# Patient Record
Sex: Female | Born: 1950 | Race: White | Hispanic: No | Smoking: Former smoker
Health system: Southern US, Community
[De-identification: ages and names within clinical notes are randomized; demographics above are authoritative.]

## PROBLEM LIST (undated history)

## (undated) DIAGNOSIS — K259 Gastric ulcer, unspecified as acute or chronic, without hemorrhage or perforation: Secondary | ICD-10-CM

## (undated) DIAGNOSIS — D75839 Thrombocytosis, unspecified: Secondary | ICD-10-CM

## (undated) DIAGNOSIS — I8500 Esophageal varices without bleeding: Secondary | ICD-10-CM

## (undated) DIAGNOSIS — K648 Other hemorrhoids: Secondary | ICD-10-CM

## (undated) DIAGNOSIS — G4700 Insomnia, unspecified: Secondary | ICD-10-CM

## (undated) DIAGNOSIS — K7460 Unspecified cirrhosis of liver: Secondary | ICD-10-CM

## (undated) DIAGNOSIS — K641 Second degree hemorrhoids: Secondary | ICD-10-CM

## (undated) DIAGNOSIS — F32A Depression, unspecified: Secondary | ICD-10-CM

## (undated) DIAGNOSIS — R011 Cardiac murmur, unspecified: Secondary | ICD-10-CM

## (undated) DIAGNOSIS — I728 Aneurysm of other specified arteries: Secondary | ICD-10-CM

## (undated) DIAGNOSIS — G43909 Migraine, unspecified, not intractable, without status migrainosus: Secondary | ICD-10-CM

## (undated) DIAGNOSIS — K2970 Gastritis, unspecified, without bleeding: Secondary | ICD-10-CM

## (undated) DIAGNOSIS — D7581 Myelofibrosis: Secondary | ICD-10-CM

## (undated) DIAGNOSIS — C801 Malignant (primary) neoplasm, unspecified: Secondary | ICD-10-CM

## (undated) DIAGNOSIS — I81 Portal vein thrombosis: Secondary | ICD-10-CM

## (undated) DIAGNOSIS — F329 Major depressive disorder, single episode, unspecified: Secondary | ICD-10-CM

## (undated) DIAGNOSIS — D62 Acute posthemorrhagic anemia: Secondary | ICD-10-CM

## (undated) DIAGNOSIS — K766 Portal hypertension: Secondary | ICD-10-CM

## (undated) DIAGNOSIS — K3189 Other diseases of stomach and duodenum: Secondary | ICD-10-CM

## (undated) DIAGNOSIS — K635 Polyp of colon: Secondary | ICD-10-CM

## (undated) HISTORY — DX: Unspecified cirrhosis of liver: K74.60

## (undated) HISTORY — DX: Gastric ulcer, unspecified as acute or chronic, without hemorrhage or perforation: K25.9

## (undated) HISTORY — DX: Major depressive disorder, single episode, unspecified: F32.9

## (undated) HISTORY — DX: Acute posthemorrhagic anemia: D62

## (undated) HISTORY — DX: Migraine, unspecified, not intractable, without status migrainosus: G43.909

## (undated) HISTORY — DX: Second degree hemorrhoids: K64.1

## (undated) HISTORY — DX: Gastritis, unspecified, without bleeding: K29.70

## (undated) HISTORY — DX: Insomnia, unspecified: G47.00

## (undated) HISTORY — DX: Aneurysm of other specified arteries: I72.8

## (undated) HISTORY — DX: Portal hypertension: K76.6

## (undated) HISTORY — DX: Other hemorrhoids: K64.8

## (undated) HISTORY — DX: Polyp of colon: K63.5

## (undated) HISTORY — DX: Esophageal varices without bleeding: I85.00

## (undated) HISTORY — PX: TONSILLECTOMY: SUR1361

## (undated) HISTORY — DX: Portal vein thrombosis: I81

## (undated) HISTORY — DX: Cardiac murmur, unspecified: R01.1

## (undated) HISTORY — PX: LIVER BIOPSY: SHX301

## (undated) HISTORY — DX: Other diseases of stomach and duodenum: K31.89

## (undated) HISTORY — DX: Depression, unspecified: F32.A

---

## 1998-03-21 ENCOUNTER — Other Ambulatory Visit: Admission: RE | Admit: 1998-03-21 | Discharge: 1998-03-21 | Payer: Self-pay | Admitting: *Deleted

## 1998-08-02 ENCOUNTER — Emergency Department (HOSPITAL_COMMUNITY): Admission: EM | Admit: 1998-08-02 | Discharge: 1998-08-02 | Payer: Self-pay | Admitting: Emergency Medicine

## 1998-11-29 ENCOUNTER — Encounter: Payer: Self-pay | Admitting: *Deleted

## 1998-11-29 ENCOUNTER — Emergency Department (HOSPITAL_COMMUNITY): Admission: EM | Admit: 1998-11-29 | Discharge: 1998-11-29 | Payer: Self-pay | Admitting: Emergency Medicine

## 2002-07-20 ENCOUNTER — Encounter: Payer: Self-pay | Admitting: Internal Medicine

## 2002-07-20 ENCOUNTER — Ambulatory Visit (HOSPITAL_COMMUNITY): Admission: RE | Admit: 2002-07-20 | Discharge: 2002-07-20 | Payer: Self-pay | Admitting: Internal Medicine

## 2002-07-25 ENCOUNTER — Encounter: Payer: Self-pay | Admitting: Internal Medicine

## 2002-07-25 ENCOUNTER — Ambulatory Visit (HOSPITAL_COMMUNITY): Admission: RE | Admit: 2002-07-25 | Discharge: 2002-07-25 | Payer: Self-pay | Admitting: Internal Medicine

## 2002-08-07 ENCOUNTER — Encounter: Payer: Self-pay | Admitting: Internal Medicine

## 2002-08-07 ENCOUNTER — Ambulatory Visit (HOSPITAL_COMMUNITY): Admission: RE | Admit: 2002-08-07 | Discharge: 2002-08-07 | Payer: Self-pay | Admitting: Internal Medicine

## 2008-08-27 ENCOUNTER — Other Ambulatory Visit: Admission: RE | Admit: 2008-08-27 | Discharge: 2008-08-27 | Payer: Self-pay | Admitting: Family Medicine

## 2009-12-27 ENCOUNTER — Encounter: Admission: RE | Admit: 2009-12-27 | Discharge: 2009-12-27 | Payer: Self-pay | Admitting: Family Medicine

## 2010-01-02 ENCOUNTER — Ambulatory Visit (HOSPITAL_COMMUNITY): Admission: RE | Admit: 2010-01-02 | Discharge: 2010-01-02 | Payer: Self-pay | Admitting: Gastroenterology

## 2010-01-04 ENCOUNTER — Encounter: Admission: RE | Admit: 2010-01-04 | Discharge: 2010-01-04 | Payer: Self-pay | Admitting: Gastroenterology

## 2010-01-16 ENCOUNTER — Ambulatory Visit (HOSPITAL_COMMUNITY): Admission: RE | Admit: 2010-01-16 | Discharge: 2010-01-16 | Payer: Self-pay | Admitting: Oncology

## 2010-01-31 ENCOUNTER — Ambulatory Visit (HOSPITAL_COMMUNITY): Admission: RE | Admit: 2010-01-31 | Discharge: 2010-01-31 | Payer: Self-pay | Admitting: Gastroenterology

## 2010-03-04 ENCOUNTER — Ambulatory Visit: Payer: Self-pay | Admitting: Oncology

## 2010-03-13 LAB — CBC WITH DIFFERENTIAL/PLATELET
BASO%: 0.2 % (ref 0.0–2.0)
Basophils Absolute: 0 10*3/uL (ref 0.0–0.1)
MCH: 28.9 pg (ref 25.1–34.0)
MCHC: 33.8 g/dL (ref 31.5–36.0)
MONO#: 0.4 10*3/uL (ref 0.1–0.9)
RBC: 5.23 10*6/uL (ref 3.70–5.45)
WBC: 6.4 10*3/uL (ref 3.9–10.3)
lymph#: 1.3 10*3/uL (ref 0.9–3.3)
nRBC: 0 % (ref 0–0)

## 2010-03-17 LAB — HYPERCOAGULABLE PANEL, COMPREHENSIVE
Anticardiolipin IgA: 1 APL U/mL (ref <22)
Anticardiolipin IgG: 4 GPL U/mL (ref <23)
Anticardiolipin IgM: 4 MPL U/mL (ref <11)
Beta-2-Glycoprotein I IgA: 9 A Units (ref <20)
Beta-2-Glycoprotein I IgM: 10 M Units (ref <20)
DRVVT: 44.9 secs — ABNORMAL HIGH (ref 36.2–44.3)
Lupus Anticoagulant: NOT DETECTED
Protein S Activity: 62 % — ABNORMAL LOW (ref 69–129)

## 2010-03-17 LAB — COMPREHENSIVE METABOLIC PANEL
ALT: 48 U/L — ABNORMAL HIGH (ref 0–35)
AST: 57 U/L — ABNORMAL HIGH (ref 0–37)
Alkaline Phosphatase: 224 U/L — ABNORMAL HIGH (ref 39–117)
BUN: 27 mg/dL — ABNORMAL HIGH (ref 6–23)
CO2: 20 mEq/L (ref 19–32)
Glucose, Bld: 80 mg/dL (ref 70–99)
Potassium: 4.3 mEq/L (ref 3.5–5.3)
Sodium: 140 mEq/L (ref 135–145)

## 2010-03-17 LAB — PNH PROFILE (-HIGH SENSITIVITY)

## 2010-04-03 ENCOUNTER — Ambulatory Visit: Payer: Self-pay | Admitting: Oncology

## 2010-04-28 ENCOUNTER — Ambulatory Visit: Payer: Self-pay | Admitting: Gastroenterology

## 2010-05-07 ENCOUNTER — Ambulatory Visit: Payer: Self-pay | Admitting: Oncology

## 2011-01-06 ENCOUNTER — Other Ambulatory Visit: Payer: Self-pay | Admitting: Gastroenterology

## 2011-01-06 DIAGNOSIS — R110 Nausea: Secondary | ICD-10-CM

## 2011-01-09 ENCOUNTER — Other Ambulatory Visit: Payer: Self-pay

## 2011-01-21 LAB — BODY FLUID CULTURE: Culture: NO GROWTH

## 2011-01-21 LAB — CBC
HCT: 41.4 % (ref 36.0–46.0)
MCV: 89.6 fL (ref 78.0–100.0)
Platelets: 407 10*3/uL — ABNORMAL HIGH (ref 150–400)
RBC: 4.62 MIL/uL (ref 3.87–5.11)
RDW: 16 % — ABNORMAL HIGH (ref 11.5–15.5)

## 2011-01-21 LAB — APTT: aPTT: 40 seconds — ABNORMAL HIGH (ref 24–37)

## 2011-01-26 ENCOUNTER — Ambulatory Visit (INDEPENDENT_AMBULATORY_CARE_PROVIDER_SITE_OTHER): Payer: BC Managed Care – PPO | Admitting: Gastroenterology

## 2011-01-26 DIAGNOSIS — R945 Abnormal results of liver function studies: Secondary | ICD-10-CM

## 2011-01-26 DIAGNOSIS — K766 Portal hypertension: Secondary | ICD-10-CM

## 2011-01-26 LAB — ALBUMIN, FLUID (OTHER)
Albumin, Fluid: 1.3 g/dL
Albumin, Fluid: 1.2 g/dL

## 2011-01-26 LAB — BODY FLUID CELL COUNT WITH DIFFERENTIAL
Monocyte-Macrophage-Serous Fluid: 33 % — ABNORMAL LOW (ref 50–90)
Neutrophil Count, Fluid: 39 % — ABNORMAL HIGH (ref 0–25)

## 2011-01-26 LAB — PROTEIN, BODY FLUID: Total protein, fluid: 1.5 g/dL

## 2011-01-26 LAB — GLUCOSE, SEROUS FLUID

## 2011-07-27 ENCOUNTER — Ambulatory Visit: Payer: Self-pay | Admitting: Gastroenterology

## 2012-05-09 ENCOUNTER — Other Ambulatory Visit: Payer: Self-pay | Admitting: Family Medicine

## 2012-05-09 DIAGNOSIS — Z1231 Encounter for screening mammogram for malignant neoplasm of breast: Secondary | ICD-10-CM

## 2012-05-19 ENCOUNTER — Telehealth: Payer: Self-pay | Admitting: Oncology

## 2012-05-19 NOTE — Telephone Encounter (Signed)
lmonvm for pt re calling me for appt w/FS. °

## 2012-05-20 ENCOUNTER — Telehealth: Payer: Self-pay | Admitting: Oncology

## 2012-05-20 NOTE — Telephone Encounter (Signed)
lmonvm for pt re calling me for appt w/FS. 2nd attempt.

## 2012-05-23 ENCOUNTER — Telehealth: Payer: Self-pay | Admitting: Oncology

## 2012-05-23 NOTE — Telephone Encounter (Signed)
Still not able to reach pt re appt w/FS. 3rd attempt. Confirmation letter sent to referring and chart sent back to HIM.

## 2012-05-25 ENCOUNTER — Telehealth: Payer: Self-pay | Admitting: Oncology

## 2012-05-25 NOTE — Telephone Encounter (Signed)
Pt returned call today re new pt appts and was given an appt for 7/30 @ 1:30 pm. Tiffany aware.

## 2012-05-26 ENCOUNTER — Telehealth: Payer: Self-pay | Admitting: Oncology

## 2012-05-26 NOTE — Telephone Encounter (Signed)
Referred by Dr. Selena Batten Dx- Persistent Thrombocytosis. NP packet mailed out.

## 2012-05-27 ENCOUNTER — Other Ambulatory Visit: Payer: Self-pay | Admitting: Oncology

## 2012-05-27 DIAGNOSIS — D75839 Thrombocytosis, unspecified: Secondary | ICD-10-CM

## 2012-05-27 DIAGNOSIS — D473 Essential (hemorrhagic) thrombocythemia: Secondary | ICD-10-CM

## 2012-05-31 ENCOUNTER — Ambulatory Visit (HOSPITAL_BASED_OUTPATIENT_CLINIC_OR_DEPARTMENT_OTHER): Payer: BC Managed Care – PPO | Admitting: Oncology

## 2012-05-31 ENCOUNTER — Telehealth: Payer: Self-pay | Admitting: Oncology

## 2012-05-31 ENCOUNTER — Other Ambulatory Visit (HOSPITAL_BASED_OUTPATIENT_CLINIC_OR_DEPARTMENT_OTHER): Payer: BC Managed Care – PPO | Admitting: Lab

## 2012-05-31 ENCOUNTER — Ambulatory Visit: Payer: BC Managed Care – PPO

## 2012-05-31 VITALS — BP 119/77 | HR 92 | Temp 99.2°F | Ht 66.0 in | Wt 188.6 lb

## 2012-05-31 DIAGNOSIS — D473 Essential (hemorrhagic) thrombocythemia: Secondary | ICD-10-CM

## 2012-05-31 DIAGNOSIS — D75839 Thrombocytosis, unspecified: Secondary | ICD-10-CM

## 2012-05-31 DIAGNOSIS — Z86718 Personal history of other venous thrombosis and embolism: Secondary | ICD-10-CM

## 2012-05-31 DIAGNOSIS — K769 Liver disease, unspecified: Secondary | ICD-10-CM

## 2012-05-31 DIAGNOSIS — R188 Other ascites: Secondary | ICD-10-CM

## 2012-05-31 LAB — COMPREHENSIVE METABOLIC PANEL
Albumin: 4.4 g/dL (ref 3.5–5.2)
BUN: 16 mg/dL (ref 6–23)
CO2: 27 mEq/L (ref 19–32)
Chloride: 106 mEq/L (ref 96–112)
Creatinine, Ser: 0.85 mg/dL (ref 0.50–1.10)
Sodium: 140 mEq/L (ref 135–145)
Total Bilirubin: 1.6 mg/dL — ABNORMAL HIGH (ref 0.3–1.2)

## 2012-05-31 LAB — CBC WITH DIFFERENTIAL/PLATELET
BASO%: 0.6 % (ref 0.0–2.0)
EOS%: 3.9 % (ref 0.0–7.0)
Eosinophils Absolute: 0.3 10*3/uL (ref 0.0–0.5)
HCT: 46.5 % (ref 34.8–46.6)
HGB: 15.5 g/dL (ref 11.6–15.9)
LYMPH%: 17.5 % (ref 14.0–49.7)
MONO#: 0.6 10*3/uL (ref 0.1–0.9)
MONO%: 7.9 % (ref 0.0–14.0)
NEUT#: 4.9 10*3/uL (ref 1.5–6.5)
WBC: 7 10*3/uL (ref 3.9–10.3)
lymph#: 1.2 10*3/uL (ref 0.9–3.3)

## 2012-05-31 NOTE — Telephone Encounter (Signed)
appts made and printed for pt aom °

## 2012-05-31 NOTE — Progress Notes (Signed)
Note dictated

## 2012-05-31 NOTE — Progress Notes (Signed)
CC:   Courtney Buckley, M.D.  REFERRING PHYSICIAN:  Pam Buckley, M.D.  THIS IS A RE-EVALUATION CONSULTATION.  REASON FOR CONSULTATION:  Thrombocytosis.  HISTORY OF PRESENT ILLNESS:  The patient is a pleasant 61 year old woman whom I saw initially over 2 years ago for evaluation of portal vein thrombosis.  She is a pleasant, rather healthy 61 year old woman, native of [place], who currently works as a Runner, broadcasting/film/video at [employer].  The patient started developing symptoms of abdominal pain and ascites and there was a question if she had developed liver cirrhosis, but she definitely had portal vein occlusion and cavernous transformation, indicating a portal vein thrombosis and portal hypertension.  Her workup did not really reveal any specific liver disease.  She was following up with Dr. Julieta Buckley at [facility].  The patient was referred to me for evaluation for possible hypercoagulable workup.  Her workup was really unrevealing for any hypercoagulable condition.  She did have elevated platelets, as well as a JAK2 mutation, which raised the question of possible myeloproliferative disorder.  I suggested bone marrow biopsy at that time and she elected to defer that until she has completed her evaluation at [facility], and she has not been seen here at the [facility] for the last 2 years.  The patient followed up with her primary care physician, Dr. Gweneth Buckley on 05/09/2012.  Repeat blood counts at that time showed that her platelet count was 496 and she had a normal hemoglobin and normal white cells. Her liver function tests at that time showed a bilirubin slightly elevated at 1.8, normal AST and ALT, and normal BUN and creatinine.  For that reason, the patient was referred to me for re-evaluation.  Upon interviewing Courtney Buckley today, she is completely healthy at this time and has really no concerns.  She reports that she takes Lasix 40 mg a day  and that has really prevented her from developing any recurrent ascites. She had not had any abdominal pain.  Had not had any thrombotic events. Overall performance status and activity level remain at baseline.  REVIEW OF SYSTEMS:  Does not report any headaches, blurry vision, double vision.  Does not report any motor or sensory neuropathy.  Does not report any alteration in mental status.  Does not report any psychiatric issues or depression.  Does not report any fever, chills, sweats.  Does not report any cough, hemoptysis, hematemesis.  No nausea, vomiting, abdominal pain.  No hematochezia, melena, genitourinary complaints. Rest of review of systems unremarkable.  PAST MEDICAL HISTORY:  Significant for depression, history of migraines, as well as history of portal vein thrombosis and portal hypertension.  FAMILY HISTORY:  Really unremarkable for any thrombotic events or malignancies.  SOCIAL HISTORY:  She is a former smoker.  Denied any alcohol abuse. Currently works as a Runner, broadcasting/film/video.  She is separated.  She has 1 son and a daughter.  ALLERGIES:  None.  PHYSICAL EXAMINATION:  General:  Alert, awake, very pleasant woman, appeared in no active distress.  Vital Signs:  Blood pressure is 119/77, pulse 92, respirations 18, weighs 188 pounds.  ECOG performance status is 0.  HEENT:  Head is normocephalic, atraumatic.  Pupils equal, round, reactive to light.  Oral mucosa moist and pink.  Neck:  Supple without lymphadenopathy.  Heart:  Regular rate and rhythm with S1 and S2. Lungs:  Clear to auscultation.  Abdomen:  Soft, nontender.  No hepatosplenomegaly.  Extremities:  No edema.  LABORATORY DATA:  Today showed a hemoglobin of 15.5, white cell count 7.0, platelet count of 480.  ASSESSMENT AND PLAN:  A 61 year old woman with the following issues: 1. 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  Etiology of this is unknown.  She      has had a really extensive workup for malignancy and     hypercoagulability that has been negative.  At this time, she is     not chronically anticoagulated. 2. Thrombocytosis.  Her platelet count is 480 today, which is a drop     from 560 two years ago.  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     Again, the question is does she have myeloproliferative disorder.     I had a lengthy discussion today, discussing the natural course of     myeloproliferative disorders and the likelihood that she has some.     Although she had a positive JAK2 mutation, I think it is less     likely she has a myeloproliferative disorder, although I could not     completely rule it now.  The fact that she had a normal white cell     count, normal hemoglobin, and a drop in platelets actually over 2     years out, that goes against a myeloproliferative disorder,     although I discussed with her really without a bone marrow biopsy,     I would not know for sure.  At this time, I am not sure how much a     bone marrow biopsy will change my management, given the fact that     she is feeling well and Courtney Buckley preferred not to undergo any further     procedures at this time unless we have to or we will change our     management.  At this time, again her platelet count is actually     under reasonable control.  I think her risk of thrombosis is rather     low.  I did recommend a low-dose aspirin 81 mg daily unless there     is a contraindication from any of her doctors.  I will be happy to     re-evaluate her in about 1 year, sooner if there is a problem. 3. Ascites and liver disease.  Again, I am not quite sure of the     etiology of that.  This could be all related to portal     hypertension.  It seems to be under excellent control with Lasix.     She does not have any abdominal ascites or lower extremity edema     today. All her questions were answered.  Again, I will be following her on an annual basis, sooner if  needed to.    ______________________________ Benjiman Core, M.D. FNS/MEDQ  D:  05/31/2012  T:  05/31/2012  Job:  [id number]

## 2012-06-08 ENCOUNTER — Ambulatory Visit
Admission: RE | Admit: 2012-06-08 | Discharge: 2012-06-08 | Disposition: A | Discharge status: Home / Self Care | Payer: BC Managed Care – PPO | Source: Ambulatory Visit | Attending: Family Medicine | Admitting: Family Medicine

## 2012-06-08 DIAGNOSIS — Z1231 Encounter for screening mammogram for malignant neoplasm of breast: Secondary | ICD-10-CM

## 2012-11-21 ENCOUNTER — Other Ambulatory Visit: Payer: Self-pay | Admitting: Family Medicine

## 2012-11-21 DIAGNOSIS — K7460 Unspecified cirrhosis of liver: Secondary | ICD-10-CM

## 2012-12-29 ENCOUNTER — Other Ambulatory Visit: Payer: BC Managed Care – PPO

## 2013-02-06 ENCOUNTER — Ambulatory Visit
Admission: RE | Admit: 2013-02-06 | Discharge: 2013-02-06 | Disposition: A | Discharge status: Home / Self Care | Payer: BC Managed Care – PPO | Source: Ambulatory Visit | Attending: Family Medicine | Admitting: Family Medicine

## 2013-02-06 DIAGNOSIS — K7460 Unspecified cirrhosis of liver: Secondary | ICD-10-CM

## 2013-06-01 ENCOUNTER — Telehealth: Payer: Self-pay | Admitting: Oncology

## 2013-06-01 NOTE — Telephone Encounter (Signed)
Moved 8/5 appts to AM due to call day. lmonvm for pt re new time for 8/5 and mailed schedule.

## 2013-06-05 ENCOUNTER — Telehealth: Payer: Self-pay | Admitting: Oncology

## 2013-06-05 NOTE — Telephone Encounter (Signed)
pt called to cx appt and will call back to r/s °

## 2013-06-06 ENCOUNTER — Other Ambulatory Visit: Payer: BC Managed Care – PPO | Admitting: Lab

## 2013-06-06 ENCOUNTER — Ambulatory Visit: Payer: BC Managed Care – PPO | Admitting: Oncology

## 2014-04-18 ENCOUNTER — Other Ambulatory Visit: Payer: Self-pay

## 2014-04-18 ENCOUNTER — Other Ambulatory Visit: Payer: Self-pay | Admitting: Family Medicine

## 2014-04-18 DIAGNOSIS — Z1231 Encounter for screening mammogram for malignant neoplasm of breast: Secondary | ICD-10-CM

## 2014-05-02 ENCOUNTER — Telehealth: Payer: Self-pay | Admitting: Internal Medicine

## 2014-06-02 ENCOUNTER — Encounter: Payer: Self-pay | Admitting: *Deleted

## 2014-07-27 ENCOUNTER — Encounter: Payer: Self-pay | Admitting: Family Medicine

## 2014-11-14 ENCOUNTER — Other Ambulatory Visit: Payer: Self-pay | Admitting: Family Medicine

## 2014-11-15 ENCOUNTER — Other Ambulatory Visit: Payer: Self-pay | Admitting: Family Medicine

## 2014-11-15 DIAGNOSIS — K7460 Unspecified cirrhosis of liver: Secondary | ICD-10-CM

## 2014-11-22 ENCOUNTER — Other Ambulatory Visit: Payer: BC Managed Care – PPO

## 2014-11-28 ENCOUNTER — Ambulatory Visit
Admission: RE | Admit: 2014-11-28 | Discharge: 2014-11-28 | Disposition: A | Discharge status: Home / Self Care | Payer: BC Managed Care – PPO | Source: Ambulatory Visit | Attending: Family Medicine | Admitting: Family Medicine

## 2014-11-28 DIAGNOSIS — K7460 Unspecified cirrhosis of liver: Secondary | ICD-10-CM

## 2014-12-17 ENCOUNTER — Other Ambulatory Visit: Payer: Self-pay | Admitting: *Deleted

## 2014-12-18 ENCOUNTER — Telehealth: Payer: Self-pay | Admitting: Oncology

## 2014-12-18 NOTE — Telephone Encounter (Signed)
Left message to confirm appointment for 03/16. Mailed calendar.

## 2015-01-15 ENCOUNTER — Other Ambulatory Visit: Payer: Self-pay | Admitting: Oncology

## 2015-01-15 DIAGNOSIS — D75839 Thrombocytosis, unspecified: Secondary | ICD-10-CM

## 2015-01-15 DIAGNOSIS — D473 Essential (hemorrhagic) thrombocythemia: Secondary | ICD-10-CM

## 2015-01-16 ENCOUNTER — Telehealth: Payer: Self-pay | Admitting: Oncology

## 2015-01-16 ENCOUNTER — Other Ambulatory Visit (HOSPITAL_BASED_OUTPATIENT_CLINIC_OR_DEPARTMENT_OTHER): Payer: Medicare Other

## 2015-01-16 ENCOUNTER — Ambulatory Visit (HOSPITAL_BASED_OUTPATIENT_CLINIC_OR_DEPARTMENT_OTHER): Payer: Medicare Other | Admitting: Oncology

## 2015-01-16 VITALS — BP 127/72 | HR 92 | Temp 98.4°F | Resp 18 | Ht 66.0 in | Wt 202.5 lb

## 2015-01-16 DIAGNOSIS — Z86718 Personal history of other venous thrombosis and embolism: Secondary | ICD-10-CM

## 2015-01-16 DIAGNOSIS — D473 Essential (hemorrhagic) thrombocythemia: Secondary | ICD-10-CM

## 2015-01-16 DIAGNOSIS — D75839 Thrombocytosis, unspecified: Secondary | ICD-10-CM

## 2015-01-16 LAB — CBC WITH DIFFERENTIAL/PLATELET
BASO%: 0.3 % (ref 0.0–2.0)
BASOS ABS: 0 10*3/uL (ref 0.0–0.1)
EOS%: 5.4 % (ref 0.0–7.0)
Eosinophils Absolute: 0.4 10*3/uL (ref 0.0–0.5)
HEMATOCRIT: 44.6 % (ref 34.8–46.6)
HGB: 13.8 g/dL (ref 11.6–15.9)
LYMPH%: 16.5 % (ref 14.0–49.7)
MCH: 22.9 pg — AB (ref 25.1–34.0)
MCHC: 31 g/dL — AB (ref 31.5–36.0)
MCV: 74 fL — AB (ref 79.5–101.0)
MONO#: 0.4 10*3/uL (ref 0.1–0.9)
MONO%: 5.3 % (ref 0.0–14.0)
NEUT#: 5.9 10*3/uL (ref 1.5–6.5)
NEUT%: 72.5 % (ref 38.4–76.8)
PLATELETS: 614 10*3/uL — AB (ref 145–400)
RBC: 6.03 10*6/uL — ABNORMAL HIGH (ref 3.70–5.45)
RDW: 24.6 % — ABNORMAL HIGH (ref 11.2–14.5)
WBC: 8.1 10*3/uL (ref 3.9–10.3)
lymph#: 1.3 10*3/uL (ref 0.9–3.3)

## 2015-01-16 NOTE — Telephone Encounter (Signed)
gv and printed appt schedand avs for pt for Sept °

## 2015-01-16 NOTE — Progress Notes (Signed)
Hematology and Oncology Follow Up Visit  Courtney Buckley [id number] 10/04/1951 64 y.o. 01/16/2015 9:19 AM MCNEILL,WENDY, MDNo ref. provider found   Principle Diagnosis: 64 year old woman with thrombocytosis diagnosed in 2013. At that time she had a platelet count of close to 500 and she had a JAK 2 mutation positive. 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   Current therapy: Observation and surveillance.  Interim History:  The patient presents today for a follow-up visit. She is a very pleasant woman that I have not seen close to 3 years ago now for the above diagnosis. She was referred here by her primary care physician for reevaluation of thrombocytosis. Since her last visit, she continues to be completely asymptomatic. She has not reported any thrombotic events such as DVT, superficial phlebitis pulmonary embolism or arterial thrombosis. She did not have any bleeding complications either. Continue very active without any hindrance or decline.  She does not report any headaches blurred vision double vision. She does not report any syncope or seizures. She does not report any chest pain, palpitation orthopnea. She does not report any nausea or vomiting or abdominal pain. She does not report any early satiety. She does not report any change in her bowel habits. She does not report any skeletal complaints. Rest of her review of systems unremarkable.  Medications: I have reviewed the patient's current medications.  Current Outpatient Prescriptions  Medication Sig Dispense Refill  . ferrous gluconate (FERGON) 324 MG tablet Take 324 mg by mouth daily with breakfast.    . furosemide (LASIX) 40 MG tablet Take 40 mg by mouth daily.    . pantoprazole (PROTONIX) 40 MG tablet Take 40 mg by mouth daily.    Marland Kitchen PRISTIQ 100 MG 24 hr tablet Take 100 mg by mouth daily.  5   No current facility-administered medications for this visit.     Allergies: No  Known Allergies  Past Medical History, Surgical history, Social history, and Family History were reviewed and updated.   Physical Exam: Blood pressure 127/72, pulse 92, temperature 98.4 F (36.9 C), temperature source Oral, resp. rate 18, height 5\' 6"  (1.676 m), weight 202 lb 8 oz (91.853 kg), SpO2 99 %. ECOG: 1 General appearance: alert and cooperative Head: Normocephalic, without obvious abnormality Neck: no adenopathy Lymph nodes: Cervical, supraclavicular, and axillary nodes normal. Heart:regular rate and rhythm, S1, S2 normal, no murmur, click, rub or gallop Lung:chest clear, no wheezing, rales, normal symmetric air entry Abdomin: soft, non-tender, without masses or organomegaly EXT:no erythema, induration, or nodules   Lab Results: Lab Results  Component Value Date   WBC 8.1 01/16/2015   HGB 13.8 01/16/2015   HCT 44.6 01/16/2015   MCV 74.0* 01/16/2015   PLT 614* 01/16/2015     Chemistry      Component Value Date/Time   NA 140 05/31/2012 1354   K 4.2 05/31/2012 1354   CL 106 05/31/2012 1354   CO2 27 05/31/2012 1354   BUN 16 05/31/2012 1354   CREATININE 0.85 05/31/2012 1354      Component Value Date/Time   CALCIUM 9.5 05/31/2012 1354   ALKPHOS 119* 05/31/2012 1354   AST 47* 05/31/2012 1354   ALT 33 05/31/2012 1354   BILITOT 1.6* 05/31/2012 1354       Impression and Plan:   64 year old woman with the following issues:  1. Thrombocytosis: Differential diagnosis including myeloproliferative disorder such as essential thrombocythemia versus reactive finding. She did have a positive JAK2  mutation which could potentially indicate a myeloproliferative disorder. She is asymptomatic at this time and her platelet counts do not warrant a platelet lowering agent. I have recommended low-dose aspirin to decrease her risk of arterial thrombosis. I see no indication for full dose anticoagulation at this time.  2. History of portal vein thrombosis: The etiology is unknown  could be related to myeloproliferative disorder her hypercoagulable workup has been negative in the past. This could also be related to her liver disease although it is unclear to me whether the portal vein thrombosis caused the liver disease or the opposite.  3. Follow-up: Will be in 4-5 months to recheck her platelet counts. And we can consider platelet lowering agent if her platelet count increased up to 800,000.  [facility], MD 3/16/20169:19 AM

## 2015-05-09 DIAGNOSIS — K7460 Unspecified cirrhosis of liver: Secondary | ICD-10-CM | POA: Diagnosis not present

## 2015-05-09 DIAGNOSIS — R8290 Unspecified abnormal findings in urine: Secondary | ICD-10-CM | POA: Diagnosis not present

## 2015-05-09 DIAGNOSIS — G43109 Migraine with aura, not intractable, without status migrainosus: Secondary | ICD-10-CM | POA: Diagnosis not present

## 2015-05-09 DIAGNOSIS — D509 Iron deficiency anemia, unspecified: Secondary | ICD-10-CM | POA: Diagnosis not present

## 2015-05-09 DIAGNOSIS — K30 Functional dyspepsia: Secondary | ICD-10-CM | POA: Diagnosis not present

## 2015-05-09 DIAGNOSIS — H6120 Impacted cerumen, unspecified ear: Secondary | ICD-10-CM | POA: Diagnosis not present

## 2015-05-13 DIAGNOSIS — D509 Iron deficiency anemia, unspecified: Secondary | ICD-10-CM | POA: Diagnosis not present

## 2015-05-21 DIAGNOSIS — G43019 Migraine without aura, intractable, without status migrainosus: Secondary | ICD-10-CM | POA: Diagnosis not present

## 2015-06-18 ENCOUNTER — Ambulatory Visit: Payer: Medicare Other | Admitting: Oncology

## 2015-06-18 ENCOUNTER — Other Ambulatory Visit: Payer: Medicare Other

## 2015-06-24 DIAGNOSIS — K7460 Unspecified cirrhosis of liver: Secondary | ICD-10-CM | POA: Diagnosis not present

## 2015-06-24 DIAGNOSIS — Z6833 Body mass index (BMI) 33.0-33.9, adult: Secondary | ICD-10-CM | POA: Diagnosis not present

## 2015-06-24 DIAGNOSIS — K766 Portal hypertension: Secondary | ICD-10-CM | POA: Diagnosis not present

## 2015-06-24 DIAGNOSIS — I81 Portal vein thrombosis: Secondary | ICD-10-CM | POA: Diagnosis not present

## 2015-07-10 DIAGNOSIS — I81 Portal vein thrombosis: Secondary | ICD-10-CM | POA: Diagnosis not present

## 2015-07-10 DIAGNOSIS — K766 Portal hypertension: Secondary | ICD-10-CM | POA: Diagnosis not present

## 2015-07-10 DIAGNOSIS — R162 Hepatomegaly with splenomegaly, not elsewhere classified: Secondary | ICD-10-CM | POA: Diagnosis not present

## 2015-07-10 DIAGNOSIS — R188 Other ascites: Secondary | ICD-10-CM | POA: Diagnosis not present

## 2015-07-10 DIAGNOSIS — D1803 Hemangioma of intra-abdominal structures: Secondary | ICD-10-CM | POA: Diagnosis not present

## 2015-09-23 DIAGNOSIS — Z23 Encounter for immunization: Secondary | ICD-10-CM | POA: Diagnosis not present

## 2015-11-13 ENCOUNTER — Other Ambulatory Visit: Payer: Self-pay | Admitting: Family Medicine

## 2015-11-13 DIAGNOSIS — Z23 Encounter for immunization: Secondary | ICD-10-CM | POA: Diagnosis not present

## 2015-11-13 DIAGNOSIS — Z1231 Encounter for screening mammogram for malignant neoplasm of breast: Secondary | ICD-10-CM

## 2015-11-13 DIAGNOSIS — K30 Functional dyspepsia: Secondary | ICD-10-CM | POA: Diagnosis not present

## 2015-11-13 DIAGNOSIS — M85859 Other specified disorders of bone density and structure, unspecified thigh: Secondary | ICD-10-CM | POA: Diagnosis not present

## 2015-11-13 DIAGNOSIS — D473 Essential (hemorrhagic) thrombocythemia: Secondary | ICD-10-CM | POA: Diagnosis not present

## 2015-11-13 DIAGNOSIS — Z79899 Other long term (current) drug therapy: Secondary | ICD-10-CM | POA: Diagnosis not present

## 2015-11-13 DIAGNOSIS — K7460 Unspecified cirrhosis of liver: Secondary | ICD-10-CM | POA: Diagnosis not present

## 2015-11-13 DIAGNOSIS — D509 Iron deficiency anemia, unspecified: Secondary | ICD-10-CM | POA: Diagnosis not present

## 2015-11-13 DIAGNOSIS — Z1239 Encounter for other screening for malignant neoplasm of breast: Secondary | ICD-10-CM | POA: Diagnosis not present

## 2015-11-13 DIAGNOSIS — R8290 Unspecified abnormal findings in urine: Secondary | ICD-10-CM | POA: Diagnosis not present

## 2015-12-02 DIAGNOSIS — G43019 Migraine without aura, intractable, without status migrainosus: Secondary | ICD-10-CM | POA: Diagnosis not present

## 2016-01-22 ENCOUNTER — Ambulatory Visit: Payer: Medicare Other

## 2016-01-29 ENCOUNTER — Ambulatory Visit
Admission: RE | Admit: 2016-01-29 | Discharge: 2016-01-29 | Disposition: A | Discharge status: Home / Self Care | Payer: BC Managed Care – PPO | Source: Ambulatory Visit | Attending: Family Medicine | Admitting: Family Medicine

## 2016-01-29 DIAGNOSIS — Z1231 Encounter for screening mammogram for malignant neoplasm of breast: Secondary | ICD-10-CM

## 2016-05-06 DIAGNOSIS — H04123 Dry eye syndrome of bilateral lacrimal glands: Secondary | ICD-10-CM | POA: Diagnosis not present

## 2016-05-06 DIAGNOSIS — H25813 Combined forms of age-related cataract, bilateral: Secondary | ICD-10-CM | POA: Diagnosis not present

## 2016-05-08 DIAGNOSIS — R8290 Unspecified abnormal findings in urine: Secondary | ICD-10-CM | POA: Diagnosis not present

## 2016-05-08 DIAGNOSIS — Z79899 Other long term (current) drug therapy: Secondary | ICD-10-CM | POA: Diagnosis not present

## 2016-05-08 DIAGNOSIS — Z23 Encounter for immunization: Secondary | ICD-10-CM | POA: Diagnosis not present

## 2016-05-08 DIAGNOSIS — M85859 Other specified disorders of bone density and structure, unspecified thigh: Secondary | ICD-10-CM | POA: Diagnosis not present

## 2016-05-08 DIAGNOSIS — K7460 Unspecified cirrhosis of liver: Secondary | ICD-10-CM | POA: Diagnosis not present

## 2016-05-08 DIAGNOSIS — K30 Functional dyspepsia: Secondary | ICD-10-CM | POA: Diagnosis not present

## 2016-05-08 DIAGNOSIS — D509 Iron deficiency anemia, unspecified: Secondary | ICD-10-CM | POA: Diagnosis not present

## 2016-05-08 DIAGNOSIS — Z1239 Encounter for other screening for malignant neoplasm of breast: Secondary | ICD-10-CM | POA: Diagnosis not present

## 2016-05-08 DIAGNOSIS — D473 Essential (hemorrhagic) thrombocythemia: Secondary | ICD-10-CM | POA: Diagnosis not present

## 2016-05-12 DIAGNOSIS — R790 Abnormal level of blood mineral: Secondary | ICD-10-CM | POA: Diagnosis not present

## 2016-05-12 DIAGNOSIS — M85859 Other specified disorders of bone density and structure, unspecified thigh: Secondary | ICD-10-CM | POA: Diagnosis not present

## 2016-05-12 DIAGNOSIS — R011 Cardiac murmur, unspecified: Secondary | ICD-10-CM | POA: Diagnosis not present

## 2016-05-12 DIAGNOSIS — K7460 Unspecified cirrhosis of liver: Secondary | ICD-10-CM | POA: Diagnosis not present

## 2016-05-12 DIAGNOSIS — K30 Functional dyspepsia: Secondary | ICD-10-CM | POA: Diagnosis not present

## 2016-05-12 DIAGNOSIS — G43109 Migraine with aura, not intractable, without status migrainosus: Secondary | ICD-10-CM | POA: Diagnosis not present

## 2016-05-12 DIAGNOSIS — D473 Essential (hemorrhagic) thrombocythemia: Secondary | ICD-10-CM | POA: Diagnosis not present

## 2016-05-12 DIAGNOSIS — R8290 Unspecified abnormal findings in urine: Secondary | ICD-10-CM | POA: Diagnosis not present

## 2016-05-20 DIAGNOSIS — H6123 Impacted cerumen, bilateral: Secondary | ICD-10-CM | POA: Diagnosis not present

## 2017-04-14 ENCOUNTER — Other Ambulatory Visit (HOSPITAL_COMMUNITY)
Admission: RE | Admit: 2017-04-14 | Discharge: 2017-04-14 | Disposition: A | Discharge status: Home / Self Care | Payer: Medicare Other | Source: Ambulatory Visit | Attending: Family Medicine | Admitting: Family Medicine

## 2017-04-14 ENCOUNTER — Other Ambulatory Visit: Payer: Self-pay | Admitting: Family Medicine

## 2017-04-14 DIAGNOSIS — Z124 Encounter for screening for malignant neoplasm of cervix: Secondary | ICD-10-CM | POA: Diagnosis present

## 2017-04-20 LAB — CYTOLOGY - PAP

## 2017-05-03 ENCOUNTER — Telehealth: Payer: Self-pay | Admitting: Oncology

## 2017-05-03 NOTE — Telephone Encounter (Signed)
Faxed records to Dr. Addison Lank [phone]

## 2018-11-16 ENCOUNTER — Other Ambulatory Visit: Payer: Self-pay | Admitting: Family Medicine

## 2018-11-16 DIAGNOSIS — R1902 Left upper quadrant abdominal swelling, mass and lump: Secondary | ICD-10-CM

## 2018-11-24 ENCOUNTER — Ambulatory Visit
Admission: RE | Admit: 2018-11-24 | Discharge: 2018-11-24 | Disposition: A | Discharge status: Home / Self Care | Payer: Medicare Other | Source: Ambulatory Visit | Attending: Family Medicine | Admitting: Family Medicine

## 2018-11-24 DIAGNOSIS — R1902 Left upper quadrant abdominal swelling, mass and lump: Secondary | ICD-10-CM

## 2018-12-07 ENCOUNTER — Ambulatory Visit: Payer: Medicare Other | Admitting: Nurse Practitioner

## 2018-12-07 ENCOUNTER — Encounter: Payer: Self-pay | Admitting: Nurse Practitioner

## 2018-12-07 ENCOUNTER — Other Ambulatory Visit (INDEPENDENT_AMBULATORY_CARE_PROVIDER_SITE_OTHER): Payer: Medicare Other

## 2018-12-07 VITALS — BP 124/64 | HR 72 | Ht 66.0 in | Wt 209.0 lb

## 2018-12-07 DIAGNOSIS — R1010 Upper abdominal pain, unspecified: Secondary | ICD-10-CM | POA: Diagnosis not present

## 2018-12-07 DIAGNOSIS — K7460 Unspecified cirrhosis of liver: Secondary | ICD-10-CM | POA: Diagnosis not present

## 2018-12-07 DIAGNOSIS — Z1211 Encounter for screening for malignant neoplasm of colon: Secondary | ICD-10-CM | POA: Diagnosis not present

## 2018-12-07 LAB — IBC + FERRITIN
Ferritin: 11.8 ng/mL (ref 10.0–291.0)
Iron: 67 ug/dL (ref 42–145)
Saturation Ratios: 19.8 % — ABNORMAL LOW (ref 20.0–50.0)
Transferrin: 242 mg/dL (ref 212.0–360.0)

## 2018-12-07 MED ORDER — HYOSCYAMINE SULFATE 0.125 MG PO TBDP: 0.1250 mg | ORAL_TABLET | ORAL | 3 refills | Status: DC | PRN

## 2018-12-07 MED ORDER — NA SULFATE-K SULFATE-MG SULF 17.5-3.13-1.6 GM/177ML PO SOLN: ORAL | 0 refills | Status: DC

## 2018-12-07 NOTE — Progress Notes (Addendum)
Chief Complaint:   Abdominal pain / splenomegaly   IMPRESSION and PLAN:    1. 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   Last visit with [facility] was in 2016, still felt to have non-cirrhotic portal hypertension. MRI around that time [facility]) demonstrated persistent portal hypertension but liver not described as cirrhotic. However, in Epic there is a CT scan from 2011 and an U/S  Jan 2016 characterizing liver as cirrhotic.  -At this point I am proceeding with management of cirrhosis. If cirrhotic then she is compensated right now. If definitive diagnosis of cirrhosis is needed at some point then  consider liver biopsy  -Obtain liver labs to rule out viral, genetic, autoimmune etiologies of liver disease.  -AFP, needs abdominal u/s to rule out focal liver lesions. -Arrange for EGD for varices screening. The risks and benefits of EGD were discussed and the patient agrees to proceed.  -sounds like she never completed HBV vaccinations, it labs don't show immunity then will need vaccines. She doesn't need HAV vaccine as IgG positive ([facility])  2. Thrombocytosis, chronic.   3. Colon cancer screening. Never had a colonoscopy.                     -The risks and benefits of colonoscopy with possible polypectomy were discussed and the patient agrees to proceed.   4. LUQ pain. She does have splenomegaly but her intermittent pain seems more functional in nature. She describes "contractions" in LUQ and across upper abdomen and Hyoscyamine helps.  -Will refill Hyoscyamine per patient's request.   Addendum: Reviewed and agree with assessment and management plan. Pyrtle, Lajuan Lines, MD      HPI:     Patient is a 68 year old female with a history of migraines, depression / OCD, portal hypertension with portal vein thrombosis. She is referred by PCP Cari Caraway, MD for evaluation of  left-sided abdominal pain and splenomegaly.  Several years ago patient was followed by [facility] Hep[facility]ologist Dr. Monica Martinez for portal vein thrombosis and noncirrhotic portal hypertension in setting of thrombocytosis.  In 2012 she required a paracentesis and and fluid studies positive for SBP. Liver biopsy around that time was negative for fibrosis, possible outflow obstruction changes were seen  studies of the right hepatic veins and right heart revealed no obstructive processes.  She was seen by hematology, no clear hypercoagulable state was identified. There was a period of 4 years where patient was not followed by Dr. Monica Martinez but she was referred back to him 2016 to reestablish care.  She had remained clinically stable in the interim.  MRI obtained and showed stable appearance of the liver suggesting central regeneration.  There was unchanged portal vein thrombosis with cavernous transformation and sequela of portal hypertension including varices, splenomegaly and trace ascites.  There was an unchanged hemangioma and unchanged early enhancing foci in the spleen possibly representing a splenic hemartoma or vascular malformation.  Patient says she was " released" from [facility] hepatology as her condition remained stable.  Gwenette has been having recurring upper abdominal pain for at least 5 years. Pain seems to start in LUQ then moves across upper abdomen and feels like contractions. Episodes are not food related. They are not movement related but it is often uncomfortable for her to lay any any position because of LUQ discomfort. No episodes of intermittent, may last a few hours and unrelieved  by defecation. No associated N/V with episodes . PPI of only limited benefit.  Hyoscyamine has really made a bigger difference in the pain  DATA REVIEWED:   Labs 11/15/18 (PCP office) WBC 11.5, hgb 15.2, MCV 77, platelets 419 TIBC 356, iron sat 16% BUN 12, Cr 0.59 TBILI  3.8, ALP 114, AST 35, ALT 19 INR 1.4 Albumin -- No  available, wasn't a part of faxed labs TSH 2.13  Limited abd u/s 11/24/18 FINDINGS: Ultrasound of the left upper quadrant of the abdomen demonstrates an enlarged spleen which could not be included in its entirety in any one field of view. This measures approximately 13.8 cm in length with a calculated volume of 1462 cc. No other abnormalities are seen.  IMPRESSION: Splenomegaly. I   MRI Sept 2016 IMPRESSION: -- Stable appearance of the liver suggesting central regeneration. No worrisome liver lesions. -- Unchanged portal vein thrombosis with cavernous transformation and sequela of portal hypertension including varices, splenomegaly and trace ascites. 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 -- Unchanged early enhancing foci in the spleen, which may represent splenic hamartomas vs vascular malformations   Liver, biopsy April 2011 -Benign liver parenchyma with diffuse centrilobular congestion -No significant inflammation, steatosis, or fibrosis identified  Comment: We agree with the originating pathologist' s diagnosis.The liver biopsy consists of two tissue fragments with >10 portal triads.No significant steatosis is identified. The portal triads are preserved with no evidence of a chronic hepatitis.There is rare lobular inflammation.Diffuse centrilobular congestion is present which is suggestive of portal hepatic venous outflow obstruction.The iron stain is negative.The trichrome and reticulum stains show no significant fibrosis.Dr. Malva Cogan has reviewed this case and agrees with the above diagnosis.   Past Medical History:  Diagnosis Date  . Depression   . Migraine   . PVT (portal vein thrombosis)    History reviewed. No pertinent surgical history. Family History  Problem Relation Age of Onset  . Cancer - Lung Father   . Diabetes Mellitus II Maternal Aunt   . Diabetes Mellitus II Maternal Uncle     Creatinine clearance cannot be  calculated (Patient's most recent lab result is older than the maximum 21 days allowed.)  Current Outpatient Medications  Medication Sig Dispense Refill  . hyoscyamine (ANASPAZ) 0.125 MG TBDP disintergrating tablet Place 0.125 mg under the tongue every 4 (four) hours as needed.     No current facility-administered medications for this visit.     Physical Exam:     BP 124/64   Pulse 72   Ht 5\' 6"  (1.676 m)   Wt 209 lb (94.8 kg)   BMI 33.73 kg/m   GENERAL:  Pleasant female in NAD PSYCH: : Cooperative, normal affect EENT:  Mild icterus, mucous membranes moist, neck supple without masses CARDIAC:  RRR,  + murmur, no peripheral edema PULM: Normal respiratory effort, lungs CTA bilaterally, no wheezing ABDOMEN:  Nondistended, soft, nontender. No obvious masses, no hepatomegaly,  normal bowel sounds SKIN:  turgor, no lesions seen Musculoskeletal:  Normal muscle tone, normal strength NEURO: Alert and oriented x 3, no focal neurologic deficits   Tye Savoy , NP 12/07/2018, 2:39 PM

## 2018-12-07 NOTE — Patient Instructions (Addendum)
If you are age 69 or older, your body mass index should be between 23-30. Your Body mass index is 33.73 kg/m. If this is out of the aforementioned range listed, please consider follow up with your Primary Care Provider.  If you are age 32 or younger, your body mass index should be between 19-25. Your Body mass index is 33.73 kg/m. If this is out of the aformentioned range listed, please consider follow up with your Primary Care Provider.   You have been scheduled for an endoscopy and colonoscopy. Please follow the written instructions given to you at your visit today. Please pick up your prep supplies at the pharmacy within the next 1-3 days. If you use inhalers (even only as needed), please bring them with you on the day of your procedure. Your physician has requested that you go to www.startemmi.com and enter the access code given to you at your visit today. This web site gives a general overview about your procedure. However, you should still follow specific instructions given to you by our office regarding your preparation for the procedure.  We have sent the following medications to your pharmacy for you to pick up at your convenience: [facility] provider has requested that you go to the basement level for lab work before leaving today. Press "B" on the elevator. The lab is located at the first door on the left as you exit the elevator.  Follow up with me after procedure.  The schedule is not available at this time. Please call the office after your procedure and make an appointment.  Thank you for choosing me and [facility].   Tye Savoy, NP

## 2018-12-08 ENCOUNTER — Ambulatory Visit: Payer: Medicare Other | Admitting: Nurse Practitioner

## 2018-12-09 ENCOUNTER — Encounter: Payer: Self-pay | Admitting: Internal Medicine

## 2018-12-11 ENCOUNTER — Encounter: Payer: Self-pay | Admitting: Nurse Practitioner

## 2018-12-11 LAB — HEPATITIS C ANTIBODY
Hepatitis C Ab: NONREACTIVE
SIGNAL TO CUT-OFF: 0.02 (ref <1.00)

## 2018-12-11 LAB — MITOCHONDRIAL ANTIBODIES: Mitochondrial M2 Ab, IgG: <=20 U

## 2018-12-11 LAB — HEPATITIS A ANTIBODY, TOTAL: Hepatitis A AB,Total: REACTIVE — AB

## 2018-12-11 LAB — HEPATITIS B SURFACE ANTIBODY,QUALITATIVE: Hep B S Ab: NONREACTIVE

## 2018-12-11 LAB — ANTI-SMOOTH MUSCLE ANTIBODY, IGG: Actin (Smooth Muscle) Antibody (IGG): <20 U (ref <20)

## 2018-12-11 LAB — ALPHA-1-ANTITRYPSIN: A-1 Antitrypsin, Ser: 171 mg/dL (ref 83–199)

## 2018-12-11 LAB — ANA: Anti Nuclear Antibody(ANA): NEGATIVE

## 2018-12-11 LAB — HEPATITIS B SURFACE ANTIGEN: Hepatitis B Surface Ag: NONREACTIVE

## 2018-12-11 LAB — AFP TUMOR MARKER: AFP-Tumor Marker: 5.6 ng/mL

## 2018-12-19 ENCOUNTER — Ambulatory Visit (INDEPENDENT_AMBULATORY_CARE_PROVIDER_SITE_OTHER): Payer: Medicare Other

## 2018-12-19 ENCOUNTER — Other Ambulatory Visit: Payer: Self-pay | Admitting: Podiatry

## 2018-12-19 ENCOUNTER — Ambulatory Visit: Payer: Medicare Other | Admitting: Podiatry

## 2018-12-19 ENCOUNTER — Encounter: Payer: Self-pay | Admitting: Podiatry

## 2018-12-19 VITALS — BP 129/66

## 2018-12-19 DIAGNOSIS — M7752 Other enthesopathy of left foot: Secondary | ICD-10-CM | POA: Diagnosis not present

## 2018-12-19 DIAGNOSIS — M722 Plantar fascial fibromatosis: Secondary | ICD-10-CM

## 2018-12-19 DIAGNOSIS — L600 Ingrowing nail: Secondary | ICD-10-CM | POA: Diagnosis not present

## 2018-12-19 DIAGNOSIS — M7751 Other enthesopathy of right foot: Secondary | ICD-10-CM

## 2018-12-19 MED ORDER — NEOMYCIN-POLYMYXIN-HC 3.5-10000-1 OT SOLN: OTIC | 0 refills | Status: DC

## 2018-12-19 NOTE — Patient Instructions (Signed)

## 2018-12-21 NOTE — Progress Notes (Signed)
Subjective:   Patient ID: Courtney Buckley, female   DOB: 68 y.o.   MRN: [id number]   HPI Patient presents with chronic ingrown toenail deformity of the left big toe that is been sore and making it hard for her to walk or wear shoe gear comfortably and on the right there is reduction of motion of the big toe joint with pain and pressure and prominence of the medial side is been painful.  Patient states she is tried wider shoes she is tried shoe gear modifications and soaks without relief of this discomfort which is been present for a number of years and gradually getting worse.  Patient does not smoke and likes to be active   Review of Systems  All other systems reviewed and are negative.       Objective:  Physical Exam Vitals signs and nursing note reviewed.  Constitutional:      Appearance: She is well-developed.  Pulmonary:     Effort: Pulmonary effort is normal.  Musculoskeletal: Normal range of motion.  Skin:    General: Skin is warm.  Neurological:     Mental Status: She is alert.     Neurovascular status found to be intact with muscle strength adequate range of motion within normal limits.  Patient is noted to have incurvated medial border left hallux is painful when pressed with no active drainage or distal redness noted and on the right there is range of motion loss of the first MPJ with prominence of the metatarsal head and mild redness around it with pain with palpation.  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  For the right I do think it is going to require structural correction with the possibility long-term for implant or fusion and on the left I recommended  correction of the nail and she wants this done and I allowed her to sign consent form for correction today understanding risk and today infiltrated the left hallux 60 mg like Marcaine mixture sterile prep applied to the toe and using sterile instrumentation I remove the medial border exposed matrix and applied phenol 3 applications 30 seconds followed by alcohol lavage and sterile dressing.  Gave instructions on soaks and to leave dressing on 24 hours and to take it off earlier if it should start to throb.  Wrote prescription for drops and encouraged her to call with any questions concerns she may have  X-ray indicates spurring around the first metatarsal head right over left with narrowness and flattening to the joint surface right over left

## 2018-12-23 ENCOUNTER — Ambulatory Visit (AMBULATORY_SURGERY_CENTER): Payer: Medicare Other | Admitting: Internal Medicine

## 2018-12-23 ENCOUNTER — Encounter: Payer: Self-pay | Admitting: Internal Medicine

## 2018-12-23 ENCOUNTER — Encounter: Payer: Medicare Other | Admitting: Internal Medicine

## 2018-12-23 VITALS — BP 94/70 | HR 98 | Temp 98.6°F | Resp 20 | Ht 66.0 in | Wt 209.0 lb

## 2018-12-23 DIAGNOSIS — K766 Portal hypertension: Secondary | ICD-10-CM

## 2018-12-23 DIAGNOSIS — K259 Gastric ulcer, unspecified as acute or chronic, without hemorrhage or perforation: Secondary | ICD-10-CM | POA: Diagnosis not present

## 2018-12-23 DIAGNOSIS — K3189 Other diseases of stomach and duodenum: Secondary | ICD-10-CM

## 2018-12-23 DIAGNOSIS — K635 Polyp of colon: Secondary | ICD-10-CM | POA: Diagnosis not present

## 2018-12-23 DIAGNOSIS — D122 Benign neoplasm of ascending colon: Secondary | ICD-10-CM

## 2018-12-23 DIAGNOSIS — I8510 Secondary esophageal varices without bleeding: Secondary | ICD-10-CM

## 2018-12-23 DIAGNOSIS — K2970 Gastritis, unspecified, without bleeding: Secondary | ICD-10-CM

## 2018-12-23 DIAGNOSIS — Z1211 Encounter for screening for malignant neoplasm of colon: Secondary | ICD-10-CM

## 2018-12-23 DIAGNOSIS — K5140 Inflammatory polyps of colon without complications: Secondary | ICD-10-CM

## 2018-12-23 MED ORDER — SODIUM CHLORIDE 0.9 % IV SOLN: 500.0000 mL | Freq: Once | INTRAVENOUS | Status: DC

## 2018-12-23 MED ORDER — NADOLOL 20 MG PO TABS: 20.0000 mg | ORAL_TABLET | Freq: Every day | ORAL | 2 refills | Status: DC

## 2018-12-23 MED ORDER — PANTOPRAZOLE SODIUM 40 MG PO TBEC: 40.0000 mg | DELAYED_RELEASE_TABLET | Freq: Two times a day (BID) | ORAL | 6 refills | Status: DC

## 2018-12-23 NOTE — Patient Instructions (Signed)
Handout given on polyps, diverticulosis and hemorrhoids. 2 bottles of contrast given with blank instructions.  Please refrigerate.  Someone will call you to schedule.  Please fill in form then with date and times. 2 medicines ordered.    YOU HAD AN ENDOSCOPIC PROCEDURE TODAY AT [facility]:   Refer to the procedure report that was given to you for any specific questions about what was found during the examination.  If the procedure report does not answer your questions, please call your gastroenterologist to clarify.  If you requested that your care partner not be given the details of your procedure findings, then the procedure report has been included in a sealed envelope for you to review at your convenience later.  YOU SHOULD EXPECT: Some feelings of bloating in the abdomen. Passage of more gas than usual.  Walking can help get rid of the air that was put into your GI tract during the procedure and reduce the bloating. If you had a lower endoscopy (such as a colonoscopy or flexible sigmoidoscopy) you may notice spotting of blood in your stool or on the toilet paper. If you underwent a bowel prep for your procedure, you may not have a normal bowel movement for a few days.  Please Note:  You might notice some irritation and congestion in your nose or some drainage.  This is from the oxygen used during your procedure.  There is no need for concern and it should clear up in a day or so.  SYMPTOMS TO REPORT IMMEDIATELY:   Following lower endoscopy (colonoscopy or flexible sigmoidoscopy):  Excessive amounts of blood in the stool  Significant tenderness or worsening of abdominal pains  Swelling of the abdomen that is new, acute  Fever of 100F or higher   Following upper endoscopy (EGD)  Vomiting of blood or coffee ground material  New chest pain or pain under the shoulder blades  Painful or persistently difficult swallowing  New shortness of breath  Fever of 100F or  higher  Black, tarry-looking stools  For urgent or emergent issues, a gastroenterologist can be reached at any hour by calling [phone].   DIET:  We do recommend a small meal at first, but then you may proceed to your regular diet.  Drink plenty of fluids but you should avoid alcoholic beverages for 24 hours.  ACTIVITY:  You should plan to take it easy for the rest of today and you should NOT DRIVE or use heavy machinery until tomorrow (because of the sedation medicines used during the test).    FOLLOW UP: Our staff will call the number listed on your records the next business day following your procedure to check on you and address any questions or concerns that you may have regarding the information given to you following your procedure. If we do not reach you, we will leave a message.  However, if you are feeling well and you are not experiencing any problems, there is no need to return our call.  We will assume that you have returned to your regular daily activities without incident.  If any biopsies were taken you will be contacted by phone or by letter within the next 1-3 weeks.  Please call us at [phone] if you have not heard about the biopsies in 3 weeks.    SIGNATURES/CONFIDENTIALITY: You and/or your care partner have signed paperwork which will be entered into your electronic medical record.  These signatures attest to the fact that that the  information above on your After Visit Summary has been reviewed and is understood.  Full responsibility of the confidentiality of this discharge information lies with you and/or your care-partner.

## 2018-12-23 NOTE — Progress Notes (Signed)
Pt awake. VSS. Report given to RN. No anesthetic complications noted 

## 2018-12-23 NOTE — Op Note (Signed)
[place] Patient Name: Courtney Buckley Procedure Date: 12/23/2018 2:44 PM MRN: [id number] Endoscopist: Jerene Bears , MD Age: 68 Referring MD:  Date of Birth: Jun 14, 1951 Gender: Female Account #: [id number] Procedure:                Colonoscopy Indications:              Screening for colorectal malignant neoplasm, This                            is the patient's first colonoscopy Medicines:                Monitored Anesthesia Care Procedure:                Pre-Anesthesia Assessment:                           - Prior to the procedure, a History and Physical                            was performed, and patient medications and                            allergies were reviewed. The patient's tolerance of                            previous anesthesia was also reviewed. The risks                            and benefits of the procedure and the sedation                            options and risks were discussed with the patient.                            All questions were answered, and informed consent                            was obtained. Prior Anticoagulants: The patient has                            taken no previous anticoagulant or antiplatelet                            agents. ASA Grade Assessment: III - A patient with                            severe systemic disease. After reviewing the risks                            and benefits, the patient was deemed in                            satisfactory condition to undergo the procedure.  After obtaining informed consent, the colonoscope                            was passed under direct vision. Throughout the                            procedure, the patient's blood pressure, pulse, and                            oxygen saturations were monitored continuously. The                            Colonoscope was introduced through the anus and                            advanced to the  cecum, identified by appendiceal                            orifice and ileocecal valve. The colonoscopy was                            performed without difficulty. The patient tolerated                            the procedure well. The quality of the bowel                            preparation was good. The ileocecal valve,                            appendiceal orifice, and rectum were photographed. Scope In: 3:10:21 PM Scope Out: 3:25:26 PM Scope Withdrawal Time: 0 hours 10 minutes 22 seconds  Total Procedure Duration: 0 hours 15 minutes 5 seconds  Findings:                 The digital rectal exam was normal.                           A 5 mm polyp was found in the ascending colon. The                            polyp was sessile. The polyp was removed with a                            cold snare. Resection and retrieval were complete.                           Internal hemorrhoids were found during                            retroflexion. The hemorrhoids were small.                           The exam was otherwise without abnormality. Complications:  No immediate complications. Estimated Blood Loss:     Estimated blood loss was minimal. Impression:               - One 5 mm polyp in the ascending colon, removed                            with a cold snare. Resected and retrieved.                           - Internal hemorrhoids.                           - The examination was otherwise normal. Recommendation:           - Patient has a contact number available for                            emergencies. The signs and symptoms of potential                            delayed complications were discussed with the                            patient. Return to normal activities tomorrow.                            Written discharge instructions were provided to the                            patient.                           - Resume previous diet.                           -  Continue present medications.                           - Await pathology results.                           - Repeat colonoscopy is recommended for                            surveillance. The colonoscopy date will be                            determined after pathology results from today's                            exam become available for review. Jerene Bears, MD 12/23/2018 3:41:20 PM This report has been signed electronically.

## 2018-12-23 NOTE — Op Note (Signed)
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 The patient's tolerance of                            previous anesthesia was also reviewed. The risks                            and benefits of the procedure and the sedation                            options and risks were discussed with the patient.                            All questions were answered, and informed consent                            was obtained. Prior Anticoagulants: The patient has                            taken no previous anticoagulant or antiplatelet                            agents. ASA Grade Assessment: III - A patient with  severe systemic disease. After reviewing the risks                            and benefits, the patient was deemed in                            satisfactory condition to undergo the procedure.                           After obtaining informed consent, the endoscope was                            passed under direct vision. Throughout the                            procedure, the  patient's blood pressure, pulse, and                            oxygen saturations were monitored continuously. The                            Endoscope was introduced through the mouth, and                            advanced to the second part of duodenum. The upper                            GI endoscopy was accomplished without difficulty.                            The patient tolerated the procedure well. Scope In: Scope Out: Findings:                 Grade II varices were found in the lower third of                            the esophagus. They were small in size.                           The exam of the esophagus was otherwise normal.                           One non-bleeding cratered gastric ulcer was found                            in the gastric antrum. The lesion was 6 mm in                            largest dimension. Biopsies were taken with a cold                            forceps for histology.  Diffuse severe inflammation characterized by                            congestion (edema), erosions and erythema was found                            in the distal gastric body and in the gastric                            antrum. Biopsies were taken with a cold forceps for                            histology and Helicobacter pylori testing.                           Mild portal hypertensive gastropathy was found in                            the cardia and in the gastric fundus.                           The examined duodenum was normal. Complications:            No immediate complications. Estimated Blood Loss:     Estimated blood loss was minimal. Impression:               - Grade II esophageal varices.                           - Non-bleeding gastric ulcer. Biopsied.                           - Gastritis. Biopsied.                           - Portal hypertensive gastropathy.                           - Normal examined duodenum. Recommendation:            - Patient has a contact number available for                            emergencies. The signs and symptoms of potential                            delayed complications were discussed with the                            patient. Return to normal activities tomorrow.                            Written discharge instructions were provided to the                            patient.                           -  Resume previous diet.                           - Continue present medications.                           - Await pathology results.                           - Begin pantoprazole 40 mg twice daily before first                            and last meal the day for gastric ulcer disease and                            gastritis found today.                           - Begin nadolol 20 mg daily given esophageal                            varices found today. Will need titration of dose to                            resting heart rate of 60 bpm as tolerated by blood                            pressure.                           - Office follow-up with me next available                           - MRI abdomen with and without contrast recommended                            to evaluate hepatic parenchyma and vasculature;                            this will suffice for [facility] screening which is due                            now. Jerene Bears, MD 12/23/2018 3:39:43 PM This report has been signed electronically.

## 2018-12-23 NOTE — Progress Notes (Signed)
Called to room to assist during endoscopic procedure.  Patient ID and intended procedure confirmed with present staff. Received instructions for my participation in the procedure from the performing physician.  

## 2018-12-26 ENCOUNTER — Telehealth: Payer: Self-pay | Admitting: *Deleted

## 2018-12-26 ENCOUNTER — Telehealth: Payer: Self-pay

## 2018-12-26 ENCOUNTER — Other Ambulatory Visit: Payer: Self-pay

## 2018-12-26 DIAGNOSIS — K7460 Unspecified cirrhosis of liver: Secondary | ICD-10-CM

## 2018-12-26 NOTE — Telephone Encounter (Signed)
First attempt, left VM.  

## 2018-12-26 NOTE — Telephone Encounter (Signed)
Called [phone] and left a messaged we tried to reach pt for a follow up call. maw

## 2018-12-26 NOTE — Telephone Encounter (Signed)
Pt scheduled for MR of abd w and w/o contrast at [facility] 12/31/18@9am , pt to arrive there at 8:30am. Pt to be NPO after midnight. Pt to come for labs prior to scan, orders in epic. Left message for pt to call back.

## 2018-12-27 NOTE — Telephone Encounter (Signed)
Spoke with pt and she is aware of appt and instructions.

## 2018-12-28 ENCOUNTER — Encounter: Payer: Self-pay | Admitting: Podiatry

## 2018-12-28 ENCOUNTER — Ambulatory Visit: Payer: Medicare Other | Admitting: Podiatry

## 2018-12-28 DIAGNOSIS — M205X1 Other deformities of toe(s) (acquired), right foot: Secondary | ICD-10-CM

## 2018-12-28 DIAGNOSIS — Z9889 Other specified postprocedural states: Secondary | ICD-10-CM

## 2018-12-28 DIAGNOSIS — L600 Ingrowing nail: Secondary | ICD-10-CM

## 2018-12-28 NOTE — Patient Instructions (Signed)
Pre-Operative Instructions  Congratulations, you have decided to take an important step towards improving your quality of life.  You can be assured that the doctors and staff at [facility] will be with you every step of the way.  Here are some important things you should know:  1. Plan to be at the surgery center/hospital at least 1 (one) hour prior to your scheduled time, unless otherwise directed by the surgical center/hospital staff.  You must have a responsible adult accompany you, remain during the surgery and drive you home.  Make sure you have directions to the surgical center/hospital to ensure you arrive on time. 2. If you are having surgery at [facility] or [facility] hospitals, you will need a copy of your medical history and physical form from your family physician within one month prior to the date of surgery. We will give you a form for your primary physician to complete.  3. We make every effort to accommodate the date you request for surgery.  However, there are times where surgery dates or times have to be moved.  We will contact you as soon as possible if a change in schedule is required.   4. No aspirin/ibuprofen for one week before surgery.  If you are on aspirin, any non-steroidal anti-inflammatory medications (Mobic, Aleve, Ibuprofen) should not be taken seven (7) days prior to your surgery.  You make take Tylenol for pain prior to surgery.  5. Medications - If you are taking daily heart and blood pressure medications, seizure, reflux, allergy, asthma, anxiety, pain or diabetes medications, make sure you notify the surgery center/hospital before the day of surgery so they can tell you which medications you should take or avoid the day of surgery. 6. No food or drink after midnight the night before surgery unless directed otherwise by surgical center/hospital staff. 7. No alcoholic beverages 24-hours prior to surgery.  No smoking 24-hours prior or 24-hours after  surgery. 8. Wear loose pants or shorts. They should be loose enough to fit over bandages, boots, and casts. 9. Don't wear slip-on shoes. Sneakers are preferred. 10. Bring your boot with you to the surgery center/hospital.  Also bring crutches or a walker if your physician has prescribed it for you.  If you do not have this equipment, it will be provided for you after surgery. 11. If you have not been contacted by the surgery center/hospital by the day before your surgery, call to confirm the date and time of your surgery. 12. Leave-time from work may vary depending on the type of surgery you have.  Appropriate arrangements should be made prior to surgery with your employer. 13. Prescriptions will be provided immediately following surgery by your doctor.  Fill these as soon as possible after surgery and take the medication as directed. Pain medications will not be refilled on weekends and must be approved by the doctor. 14. Remove nail polish on the operative foot and avoid getting pedicures prior to surgery. 15. Wash the night before surgery.  The night before surgery wash the foot and leg well with water and the antibacterial soap provided. Be sure to pay special attention to beneath the toenails and in between the toes.  Wash for at least three (3) minutes. Rinse thoroughly with water and dry well with a towel.  Perform this wash unless told not to do so by your physician.  Enclosed: 1 Ice pack (please put in freezer the night before surgery)   1 Hibiclens skin cleaner     Pre-op instructions  If you have any questions regarding the instructions, please do not hesitate to call our office.  [place]: [address], , [place] [address] -- [phone]  [place]: [address] [place], [place] [address] -- [phone]  [place]: [address]  [place],  [address] -- [phone]  [place]: [address], [address], [place],  [address] -- [phone]  Website:  [email or web address] 

## 2018-12-29 ENCOUNTER — Other Ambulatory Visit (INDEPENDENT_AMBULATORY_CARE_PROVIDER_SITE_OTHER): Payer: Medicare Other

## 2018-12-29 DIAGNOSIS — K7460 Unspecified cirrhosis of liver: Secondary | ICD-10-CM

## 2018-12-29 LAB — CREATININE, SERUM: Creatinine, Ser: 0.65 mg/dL (ref 0.40–1.20)

## 2018-12-29 LAB — BUN: BUN: 15 mg/dL (ref 6–23)

## 2018-12-29 NOTE — Progress Notes (Signed)
Subjective:   Patient ID: Courtney Buckley, female   DOB: 68 y.o.   MRN: [id number]   HPI Patient states the nail is doing well left and she is here to discuss the surgery for her right big toe joint that is been absolutely killing her and making it hard to wear shoe gear.  Patient has no other pathology noted   ROS      Objective:  Physical Exam  Neurovascular status intact muscle strength was found to be adequate with patient found to have well-healed nail site left with inflammation pain of the first MPJ right with medial dorsal eminence of the first metatarsal and reduced range of motion with pain both on the bone structure and within the joint itself     Assessment:  Acute hallux limitus with structural bunion deformity right with well-healed ingrown toenail deformity left     Plan:  H&P conditions reviewed and at this point for the right I have recommended surgical intervention.  Patient has tried wider shoes has tried modifications in gait and has tried oral anti-inflammatories without relief.  Today went ahead and allowed her to read consent form for structural correction of right explaining a possible biplanar type osteotomy versus possible joint replacement procedure depending on the condition of the joint.  I educated her on this and after extensive review she signed consent form understanding risk and alternative procedures.  Patient is scheduled for outpatient surgery in the next several weeks and understands recovery can take up to 6 to 12 months for complete recovery and that long-term if we try to repair the joint the possibility for implant for fusion is present.  Patient was dispensed air fracture walker with all instructions on usage and is encouraged to call with any questions concerns she may have

## 2018-12-30 ENCOUNTER — Encounter: Payer: Self-pay | Admitting: Internal Medicine

## 2018-12-31 ENCOUNTER — Ambulatory Visit (HOSPITAL_COMMUNITY)
Admission: RE | Admit: 2018-12-31 | Discharge: 2018-12-31 | Disposition: A | Discharge status: Home / Self Care | Payer: Medicare Other | Source: Ambulatory Visit | Attending: Internal Medicine | Admitting: Internal Medicine

## 2018-12-31 DIAGNOSIS — K7460 Unspecified cirrhosis of liver: Secondary | ICD-10-CM

## 2018-12-31 MED ORDER — GADOBUTROL 1 MMOL/ML IV SOLN
10.0000 mL | Freq: Once | INTRAVENOUS | Status: AC | PRN
  Administered 2018-12-31: 9 mL via INTRAVENOUS

## 2019-01-03 ENCOUNTER — Encounter: Payer: Self-pay | Admitting: Podiatry

## 2019-01-03 DIAGNOSIS — M2021 Hallux rigidus, right foot: Secondary | ICD-10-CM | POA: Diagnosis not present

## 2019-01-05 ENCOUNTER — Telehealth: Payer: Self-pay | Admitting: Podiatry

## 2019-01-05 ENCOUNTER — Ambulatory Visit: Payer: Medicare Other | Admitting: Internal Medicine

## 2019-01-05 MED ORDER — IBUPROFEN 800 MG PO TABS: 800.0000 mg | ORAL_TABLET | Freq: Three times a day (TID) | ORAL | 0 refills | Status: DC | PRN

## 2019-01-05 NOTE — Addendum Note (Signed)
Addended by: Harriett Sine D on: 01/05/2019 03:03 PM   Modules accepted: Orders

## 2019-01-05 NOTE — Telephone Encounter (Signed)
Pt is sick from the pain meds, needs a new prescription. Please call patient

## 2019-01-05 NOTE — Telephone Encounter (Signed)
I called pt, she states she was given a medication for nausea and oxycodone with tylenol and she has liver disease and doesn't usually take tylenol. Pt states she is nauseous and dizzy and would like a less strong medication. I told pt not to take the oxycodone with tylenol and asked what she was able to take for pain. Pt states she can take ibuprofen. I told pt to take the zofran for nausea and OTC ibprofen for the pain as the package instructions if she could tolerate and I would inform Dr. Paulla Dolly.

## 2019-01-05 NOTE — Telephone Encounter (Signed)
Dr. Paulla Dolly states Ibuprofen 800mg  #90 one tablet 3 times day. Pt states understanding.

## 2019-01-05 NOTE — Telephone Encounter (Signed)
I had surgery on Tuesday and the pain medication is making me sick so I no longer want to take it.

## 2019-01-09 ENCOUNTER — Ambulatory Visit (INDEPENDENT_AMBULATORY_CARE_PROVIDER_SITE_OTHER): Payer: Medicare Other

## 2019-01-09 ENCOUNTER — Encounter: Payer: Self-pay | Admitting: Podiatry

## 2019-01-09 ENCOUNTER — Ambulatory Visit (INDEPENDENT_AMBULATORY_CARE_PROVIDER_SITE_OTHER): Payer: Medicare Other | Admitting: Podiatry

## 2019-01-09 VITALS — Temp 98.5°F

## 2019-01-09 DIAGNOSIS — Z09 Encounter for follow-up examination after completed treatment for conditions other than malignant neoplasm: Secondary | ICD-10-CM

## 2019-01-09 DIAGNOSIS — M205X1 Other deformities of toe(s) (acquired), right foot: Secondary | ICD-10-CM

## 2019-01-11 NOTE — Progress Notes (Signed)
Subjective:   Patient ID: Courtney Buckley, female   DOB: 68 y.o.   MRN: [id number]   HPI Patient states doing well with surgery taking Motrin but so far minimal discomfort   ROS      Objective:  Physical Exam  Neurovascular status intact with patient's right first MPJ healing well wound edges well coapted moderate restriction of dorsiflexion but no crepitus of the joint     Assessment:  Doing well post osteotomy first metatarsal right     Plan:  H&P condition reviewed and discussed physical therapy for motion but that so far very pleased with how it is doing.  I reapplied sterile dressing gave instructions for elevation compression immobilization and reappoint 3 weeks or earlier if needed  X-ray indicates the osteotomy is healing well fixation in place joint congruence and open currently

## 2019-01-23 ENCOUNTER — Encounter: Payer: Self-pay | Admitting: Podiatry

## 2019-01-23 ENCOUNTER — Other Ambulatory Visit: Payer: Self-pay

## 2019-01-23 ENCOUNTER — Ambulatory Visit (INDEPENDENT_AMBULATORY_CARE_PROVIDER_SITE_OTHER): Payer: Medicare Other

## 2019-01-23 ENCOUNTER — Ambulatory Visit (INDEPENDENT_AMBULATORY_CARE_PROVIDER_SITE_OTHER): Payer: Medicare Other | Admitting: Podiatry

## 2019-01-23 DIAGNOSIS — M205X1 Other deformities of toe(s) (acquired), right foot: Secondary | ICD-10-CM

## 2019-01-23 NOTE — Progress Notes (Signed)
Subjective:   Patient ID: Courtney Buckley, female   DOB: 68 y.o.   MRN: [id number]   HPI Patient states overall her foot feels good and she is been going to physical therapy and does admit that she walked on her foot without any type of immobilization yesterday for the day.  States overall having minimal discomfort     ROS      Objective:  Physical Exam  Neurovascular status found to be intact negative Homans sign noted with patient's right foot healing well with no crepitus of the joint noted and good range of motion both dorsi and plantar flexion with no restrictions noted.     Assessment:  Overall doing well post osteotomy right first metatarsal     Plan:  Reviewed condition and recommended that she continue physical therapy but I did discuss this been some slight stress on the osteotomy secondary to her ambulation activities and that I want her to be careful with this and continue immobilization.  Reappoint in about 3 weeks or earlier if needed  X-ray indicates that the osteotomy may have slight stress on it with a very slight crack mark but if it is it is minimal and it should not be a long-term problem and there was no clinical irritation of the joint noted

## 2019-02-01 ENCOUNTER — Encounter: Payer: Self-pay | Admitting: *Deleted

## 2019-02-16 ENCOUNTER — Encounter: Payer: Self-pay | Admitting: *Deleted

## 2019-02-20 ENCOUNTER — Ambulatory Visit (INDEPENDENT_AMBULATORY_CARE_PROVIDER_SITE_OTHER): Payer: Medicare Other | Admitting: Internal Medicine

## 2019-02-20 ENCOUNTER — Encounter: Payer: Self-pay | Admitting: Internal Medicine

## 2019-02-20 ENCOUNTER — Ambulatory Visit (INDEPENDENT_AMBULATORY_CARE_PROVIDER_SITE_OTHER): Payer: Medicare Other | Admitting: Podiatry

## 2019-02-20 ENCOUNTER — Other Ambulatory Visit: Payer: Self-pay

## 2019-02-20 ENCOUNTER — Ambulatory Visit (INDEPENDENT_AMBULATORY_CARE_PROVIDER_SITE_OTHER): Payer: Medicare Other

## 2019-02-20 ENCOUNTER — Encounter: Payer: Self-pay | Admitting: Podiatry

## 2019-02-20 VITALS — Temp 97.2°F

## 2019-02-20 VITALS — Ht 66.0 in | Wt 210.0 lb

## 2019-02-20 DIAGNOSIS — I8510 Secondary esophageal varices without bleeding: Secondary | ICD-10-CM | POA: Diagnosis not present

## 2019-02-20 DIAGNOSIS — I81 Portal vein thrombosis: Secondary | ICD-10-CM | POA: Diagnosis not present

## 2019-02-20 DIAGNOSIS — M205X1 Other deformities of toe(s) (acquired), right foot: Secondary | ICD-10-CM

## 2019-02-20 DIAGNOSIS — K279 Peptic ulcer, site unspecified, unspecified as acute or chronic, without hemorrhage or perforation: Secondary | ICD-10-CM

## 2019-02-20 DIAGNOSIS — K7460 Unspecified cirrhosis of liver: Secondary | ICD-10-CM | POA: Diagnosis not present

## 2019-02-20 NOTE — Progress Notes (Signed)
Subjective:   Patient ID: Courtney Buckley, female   DOB: 68 y.o.   MRN: [id number]   HPI Patient states she is having minimal pain with her foot and states that overall is doing very well and the physical therapy seems to be helping her   ROS      Objective:  Physical Exam  Neurovascular status intact with patient's right foot healing well with mild edema noted and good range of motion with no crepitus of the joint and incision site is well-healed     Assessment:  Overall appears to be doing well with mild swelling consistent with this.  The patient did have slight movement of bone at last visit with soft bone and cyst formation at the preoperative evaluation      Plan:  H&P x-ray reviewed and I have advised on continued being careful with this for the next 4 to 6 weeks continue range of motion exercises physical therapy.  Reappoint for [place] to recheck  X-ray indicates that there is a slight crack in the osteotomy in the plantar portion but the fixation is holding there is no indications of worsening that should heal uneventfully at this position but she will continue to be careful

## 2019-02-20 NOTE — Progress Notes (Signed)
Subjective:    Patient ID: Courtney Buckley, female    DOB: 06-30-1951, 68 y.o.   MRN: [id number]  This service was provided via telemedicine.  Visit today by telephone, audio only. The patient was located at home The provider was located in [place] office The patient did consent to this telephone visit and is aware of possible charges through their insurance for this visit.   The other persons participating in this telemedicine service were patient and I Time spent on call: 25 min   HPI Courtney Buckley is a 68 year old female with a previous diagnosis of noncirrhotic portal hypertension, subsequent imaging supporting the diagnosis of cirrhosis with portal hypertension, esophageal varices, history of chronic portal vein thrombosis, migraines and depression who is seen by tele-visit rather than in the office for follow-up in the setting of the COVID-19 pandemic.  She was last seen in the office for an upper endoscopy and colonoscopy performed on 12/23/2018.  Upper endoscopy revealed small grade 2 varices in the lower esophagus, mild portal gastropathy, and an antral ulcer which was biopsied and found to be benign without H. pylori.  Colonoscopy revealed a 5 mm ascending polyp which was removed and found to be inflammatory.  No adenomatous change.  Small internal hemorrhoids.  She reports that she has been doing and feeling significantly better since starting the pantoprazole medication.  She has had no issues with abdominal pain, nausea or vomiting.  No heartburn.  No dysphagia.  Bowel habits have been regular without blood or melena.  No jaundice, confusion, abdominal or lower extremity swelling.  No bleeding.  Her ex-husband who visited her recently was diagnosed with COVID-19 and so she has been quarantined.  The 14-day quarantine has passed and she has not had upper respiratory symptoms.  She has been using ibuprofen 600 mg twice a day for chronic joint pain and occasional  headache.  She was told to avoid Tylenol due to her liver disease.  She never had a Tylenol allergy.  The nadolol which I called in after her upper endoscopy was never received.  Thus she is not taking beta-blocker.  She has been using the pantoprazole 40 mg twice a day.   Review of Systems As per HPI, otherwise negative  Current Medications, Allergies, Past Medical History, Past Surgical History, Family History and Social History were reviewed in [employer] record.     Objective:   Physical Exam No physical exam, virtual visit  MRI ABDOMEN WITHOUT AND WITH CONTRAST   TECHNIQUE: Multiplanar multisequence MR imaging of the abdomen was performed both before and after the administration of intravenous contrast.   CONTRAST:  9 cc of Gadavist.   COMPARISON:  CT AP 12/27/2009   FINDINGS: Lower chest: No acute findings.   Hepatobiliary: Hypertrophy of the caudate lobe of liver is identified. Mild contour irregularity of the liver also noted. There is no arterial phase enhancing liver lesions identified suspicious for hepatoma. 1.2 cm mildly T2 hyperintense and T1 hypointense lesion within the right lobe of liver is again noted. This shows progressive fill-in with contrast material on delayed images compatible with a benign hemangioma, image 40/904.   Pancreas: No mass, inflammatory changes, or other parenchymal abnormality identified.   Spleen: Massive splenomegaly. The spleen measures 15.1 by 10.3 by 19.4 cm (volume = 1580 cm^3). No suspicious splenic lesion identified.   Adrenals/Urinary Tract: Normal appearance of the adrenal glands. The right kidney appears normal. The left kidney is displaced within the left  lower quadrant of the abdomen by the enlarged spleen and is suboptimally visualized. No findings identified to suggest obstructive uropathy or kidney mass however.   Stomach/Bowel: There is no gastric distention. The small bowel loops are  all displaced into the right abdomen by enlarged spleen and varicosities.   Vascular/Lymphatic: Normal appearance of the abdominal aorta. IVC and hepatic veins are patent. 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 Massive perisplenic varices are identified within the left abdomen, increased in size from previous exam.   Other: Previously noted abdominal ascites has resolved in the interval.   Musculoskeletal: No suspicious bone lesions identified.   IMPRESSION: 1. Morphologic features of the liver compatible with cirrhosis. Stigmata of portal venous hypertension identified including marked splenomegaly and massive peri splenic varicosities. 2. No suspicious liver lesion identified. Right lobe of liver hemangioma is again noted. 3. 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     Electronically Signed   By: Kerby Moors M.D.   On: 12/31/2018 14:31   Labs: taken from visit with Tye Savoy, NP 12/07/18 Labs 11/15/18 (PCP office) WBC 11.5, hgb 15.2, MCV 77, platelets 419 TIBC 356, iron sat 16% BUN 12, Cr 0.59 TBILI  3.8, ALP 114, AST 35, ALT 19 INR 1.4 Albumin -- No available, wasn't a part of faxed labs TSH 2.13      Assessment & Plan:  68 year old female with a previous diagnosis of noncirrhotic portal hypertension, subsequent imaging supporting the diagnosis of cirrhosis with portal hypertension, esophageal varices, history of chronic portal vein thrombosis, migraines and depression who is seen by tele-visit rather than in the office for follow-up in the setting of the COVID-19 pandemic.    1.  Cirrhosis with portal hypertension and chronic portal vein thrombosis --years ago she was felt to have noncirrhotic portal hypertension, and now by imaging she has cirrhosis.  This is cryptogenic.  Complicated by esophageal varices, splenomegaly plus thrombocytopenia.  We spent considerable time today discussing this  diagnosis of cirrhosis, portal hypertension and the pathophysiology.  We also discussed the natural history and general maintenance in the care of cirrhosis patients.  I started nadolol at the time of her endoscopy but she was unaware of this prescription so she is not currently on beta-blocker. --[place] screening --negative by MRI recently; begin ultrasound every 6 months in February 2021 (waiting until February to begin given that last imaging was cross-sectional) --Esophageal varices --begin nadolol 20 mg daily.  We discussed the side effects of beta-blockers.  We will need to titrate to a resting heart rate of 60 bpm.  Will not need endoscopic surveillance after beta-blocker initiation --No ascites --No issues with encephalopathy --Will need hepatitis B vaccine when she returns to clinic; is hepatitis A immune --Okay to use Tylenol up to 2 g in 24-hour.  2.  Peptic ulcer disease --secondary to NSAID use.  She is using high-dose ibuprofen about 1200 mg daily.  She has been on twice daily pantoprazole and has had improvement in her abdominal pain. --Reduce pantoprazole to 40 mg once daily --Avoid NSAIDs, she will try to not take ibuprofen --Okay to use Tylenol as above  3.  CRC screening --up-to-date with recent colonoscopy, will likely not need repeat screening/surveillance colonoscopy as she will be 68 years old in 10 years.  Office follow-up in 6 to 8 weeks either in person or virtually; at her next visit she needs a CBC, CMP and INR

## 2019-03-20 ENCOUNTER — Other Ambulatory Visit: Payer: Self-pay | Admitting: Internal Medicine

## 2019-04-03 ENCOUNTER — Ambulatory Visit: Payer: Medicare Other | Admitting: Podiatry

## 2019-04-03 ENCOUNTER — Encounter: Payer: Self-pay | Admitting: Podiatry

## 2019-04-03 ENCOUNTER — Other Ambulatory Visit: Payer: Self-pay | Admitting: Podiatry

## 2019-04-03 ENCOUNTER — Ambulatory Visit (INDEPENDENT_AMBULATORY_CARE_PROVIDER_SITE_OTHER): Payer: Medicare Other

## 2019-04-03 ENCOUNTER — Other Ambulatory Visit: Payer: Self-pay

## 2019-04-03 VITALS — Temp 97.7°F

## 2019-04-03 DIAGNOSIS — M79672 Pain in left foot: Secondary | ICD-10-CM

## 2019-04-03 DIAGNOSIS — M205X1 Other deformities of toe(s) (acquired), right foot: Secondary | ICD-10-CM

## 2019-05-01 ENCOUNTER — Encounter: Payer: Self-pay | Admitting: *Deleted

## 2019-05-02 ENCOUNTER — Ambulatory Visit: Payer: Medicare Other | Admitting: Internal Medicine

## 2019-05-15 ENCOUNTER — Encounter: Payer: Self-pay | Admitting: Podiatry

## 2019-05-15 ENCOUNTER — Ambulatory Visit: Payer: Medicare Other | Admitting: Podiatry

## 2019-05-15 ENCOUNTER — Other Ambulatory Visit: Payer: Self-pay

## 2019-05-15 ENCOUNTER — Ambulatory Visit (INDEPENDENT_AMBULATORY_CARE_PROVIDER_SITE_OTHER): Payer: Medicare Other

## 2019-05-15 VITALS — Temp 97.9°F

## 2019-05-15 DIAGNOSIS — M205X1 Other deformities of toe(s) (acquired), right foot: Secondary | ICD-10-CM | POA: Diagnosis not present

## 2019-05-15 NOTE — Progress Notes (Signed)
Subjective:   Patient ID: Courtney Buckley, female   DOB: 68 y.o.   MRN: [id number]   HPI Patient states doing well overall with surgery stating that she still gets some swelling at times and she is not sure if she is bending the toes much as she should   ROS      Objective:  Physical Exam  Neurovascular status intact negative Homans sign noted wound edges well coapted good range of motion that is adequate with no crepitus of the joint noted and good excursion overall     Assessment:  Doing well overall with mild restriction which is to be expected given the long-term history of problems     Plan:  H&P reviewed x-rays and at this point working to continue to watch this and I will see her back in 3 months but she is getting continue her activities and that types of things that she is doing along with good supportive shoe gear.  Patient is explained that with slight movement of the osteotomy probable plantar fracture but it appears to be healing uneventfully and clinically is doing very well  X-ray indicates that the osteotomy overall is doing pretty well with fixation in place and slight shortening but joint congruence

## 2019-05-26 ENCOUNTER — Other Ambulatory Visit: Payer: Self-pay | Admitting: General Surgery

## 2019-05-26 ENCOUNTER — Encounter: Payer: Self-pay | Admitting: General Surgery

## 2019-05-29 ENCOUNTER — Ambulatory Visit (INDEPENDENT_AMBULATORY_CARE_PROVIDER_SITE_OTHER): Payer: Medicare Other | Admitting: Nurse Practitioner

## 2019-05-29 ENCOUNTER — Other Ambulatory Visit: Payer: Self-pay | Admitting: *Deleted

## 2019-05-29 ENCOUNTER — Encounter: Payer: Self-pay | Admitting: Nurse Practitioner

## 2019-05-29 VITALS — Ht 66.0 in | Wt 210.0 lb

## 2019-05-29 DIAGNOSIS — K7460 Unspecified cirrhosis of liver: Secondary | ICD-10-CM | POA: Diagnosis not present

## 2019-05-29 DIAGNOSIS — Z8711 Personal history of peptic ulcer disease: Secondary | ICD-10-CM

## 2019-05-29 MED ORDER — ONDANSETRON HCL 4 MG PO TABS: 4.0000 mg | ORAL_TABLET | Freq: Every day | ORAL | 1 refills | Status: DC

## 2019-05-29 NOTE — Patient Instructions (Addendum)
If you are age 69 or older, your body mass index should be between 23-30. Your Body mass index is 33.89 kg/m. If this is out of the aforementioned range listed, please consider follow up with your Primary Care Provider.  If you are age 103 or younger, your body mass index should be between 19-25. Your Body mass index is 33.89 kg/m. If this is out of the aformentioned range listed, please consider follow up with your Primary Care Provider.   Your provider has requested that you go to the basement level for lab work on Friday, 06/02/19. Press "B" on the elevator. The lab is located at the first door on the left as you exit the elevator. CBC CMET INR  We have sent the following medications to your pharmacy for you to pick up at your convenience: Zofran 4 mg daily - take with Nadolol.  Resume Nadolol daily.  You have been scheduled for a HBV injection for Friday, June 02, 2019 at 9 am.  Please go to the lab before you leave.  Thank you for choosing me and [facility].   Tye Savoy, NP

## 2019-05-29 NOTE — Progress Notes (Addendum)
Telephonic Visit in setting of [place]  Patient has given consent for a telephonic visit today and understands that is the same as is required for any face-to-face patient encounter except that the service was provided via telephone.   At the time of this telephonic visit the patient was located at [facility] and I, the Provider, at the office of [facility].   Patient was referred to [facility] by [facility] (if referred)  No one other than the patient and I participated in this telephonic visit today.   Total time of telephonic visit :  15 minutes.          Chief Complaint:   Follow up on cirrhosis   IMPRESSION and PLAN:    50. 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 Doing well.  -She stopped nadolol, thought it was causing nausea but in retrospect feels nausea was probably multifactorial.  She is willing to try again.  She inquires about a lower dose but anything lower than 20 mg probably won't offer much benefit. In fact we would eventually like to increase the dose, heart rate allowing.  She agrees to a trial of Zofran to take with the nadolol  -[place] screening: due for [place] in Feb 2021. Cross sectional imaging ( MRI ) in Feb 2020 negative for liver lesions.  -Varices screening: up to date. Grade II varices on EGD Feb 2020.  If she is able to tolerate the nadolol then repeat EGD should not be necessary -Up labs with CBC, CMET, INR.  -when comes in for labs she will start HBV vaccination  2. PUD secondary to NSAIDs. -She continues to avoid NSAIDS, takes Tylenol as needed -continue daily Pantoprazole.   Addendum: Reviewed and agree with assessment and management plan. 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 She is known to Dr. Hilarie Fredrickson, last encounter with him  was late April with Telehealth visit.  She has not consistently been on nadolol, originally did not get the prescription.  Eventually got the prescription but stopped it after a week or so because she thought it caused nausea.  In retrospect she had some other issues going on and thinks nausea was possibly multifactorial.   Patient actually feels fine, she offers no GI complaints.  Bowel movements are fine.  No increased abdominal girth or lower extremity edema.  As recommended, she avoids NSAIDs and uses Tylenol as needed for pain.  She is compliant with daily PPI.   Review of Data:  MRI IMPRESSION: 1. Morphologic features of the liver compatible with cirrhosis. Stigmata of portal venous hypertension identified including marked splenomegaly and massive peri splenic varicosities. 2. No suspicious liver lesion identified. Right lobe of liver hemangioma is again noted. 3. 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      EGD Feb 2020 Grade II esophageal varices. - Non-bleeding gastric ulcer. Biopsied. - Gastritis. Biopsied. -Portal hypertensive gastropathy -normal examined duodenum Review of systems:     No chest pain, no SOB, no fevers, no urinary sx   Past Medical History:  Diagnosis Date  . Cirrhosis ([place])   . Colon polyps   . Depression   . Esophageal varices ([place])   . Gastritis   . Internal hemorrhoids   . Migraine   . Portal hypertensive  gastropathy ([place])   . PVT (portal vein thrombosis)     Patient's surgical history, family medical history, social history, medications and allergies were all reviewed in Epic   Creatinine clearance cannot be calculated (Patient's most recent lab result is older than the maximum 21 days allowed.)  Current Outpatient Medications  Medication Sig Dispense Refill  . hyoscyamine (ANASPAZ) 0.125 MG TBDP disintergrating tablet Place 1 tablet (0.125 mg total) under the tongue every 4 (four) hours as needed. 40 each 3  .  ibuprofen (ADVIL,MOTRIN) 800 MG tablet Take 1 tablet (800 mg total) by mouth every 8 (eight) hours as needed. 90 tablet 0  . nadolol (CORGARD) 20 MG tablet TAKE 1 TABLET (20 MG TOTAL) BY MOUTH DAILY. DOSE TO BE ADJUSTED BY DR. PYRTLE AS NEEDED. (Patient not taking: Reported on 05/26/2019) 90 tablet 0  . pantoprazole (PROTONIX) 40 MG tablet Take 1 tablet (40 mg total) by mouth 2 (two) times daily. Take one tablet before the first meal, and before the last meal of the day. 60 tablet 6   No current facility-administered medications for this visit.     Physical Exam:     Ht 5\' 6"  (1.676 m)   Wt 210 lb (95.3 kg)   BMI 33.89 kg/m   Tye Savoy , NP 05/29/2019, 8:53 AM

## 2019-05-30 ENCOUNTER — Encounter: Payer: Self-pay | Admitting: Nurse Practitioner

## 2019-06-02 ENCOUNTER — Ambulatory Visit (INDEPENDENT_AMBULATORY_CARE_PROVIDER_SITE_OTHER): Payer: Medicare Other | Admitting: Internal Medicine

## 2019-06-02 ENCOUNTER — Other Ambulatory Visit (INDEPENDENT_AMBULATORY_CARE_PROVIDER_SITE_OTHER): Payer: Medicare Other

## 2019-06-02 ENCOUNTER — Telehealth: Payer: Self-pay

## 2019-06-02 DIAGNOSIS — Z8711 Personal history of peptic ulcer disease: Secondary | ICD-10-CM | POA: Diagnosis not present

## 2019-06-02 DIAGNOSIS — Z23 Encounter for immunization: Secondary | ICD-10-CM

## 2019-06-02 DIAGNOSIS — K7460 Unspecified cirrhosis of liver: Secondary | ICD-10-CM | POA: Diagnosis not present

## 2019-06-02 LAB — COMPREHENSIVE METABOLIC PANEL
ALT: 17 U/L (ref 0–35)
AST: 41 U/L — ABNORMAL HIGH (ref 0–37)
Albumin: 3.8 g/dL (ref 3.5–5.2)
Alkaline Phosphatase: 112 U/L (ref 39–117)
BUN: 16 mg/dL (ref 6–23)
CO2: 25 mEq/L (ref 19–32)
Calcium: 9.2 mg/dL (ref 8.4–10.5)
Chloride: 108 mEq/L (ref 96–112)
Creatinine, Ser: 0.66 mg/dL (ref 0.40–1.20)
GFR: 88.68 mL/min (ref >60.00)
Glucose, Bld: 150 mg/dL — ABNORMAL HIGH (ref 70–99)
Potassium: 4 mEq/L (ref 3.5–5.1)
Sodium: 142 mEq/L (ref 135–145)
Total Bilirubin: 3.4 mg/dL — ABNORMAL HIGH (ref 0.2–1.2)
Total Protein: 5.8 g/dL — ABNORMAL LOW (ref 6.0–8.3)

## 2019-06-02 LAB — PROTIME-INR
INR: 1.5 ratio — ABNORMAL HIGH (ref 0.8–1.0)
Prothrombin Time: 17.4 s — ABNORMAL HIGH (ref 9.6–13.1)

## 2019-06-02 LAB — CBC
HCT: 46.4 % — ABNORMAL HIGH (ref 36.0–46.0)
Hemoglobin: 14.7 g/dL (ref 12.0–15.0)
MCHC: 31.7 g/dL (ref 30.0–36.0)
MCV: 80.5 fl (ref 78.0–100.0)
Platelets: 407 10*3/uL — ABNORMAL HIGH (ref 150.0–400.0)
RBC: 5.77 Mil/uL — ABNORMAL HIGH (ref 3.87–5.11)
RDW: 24.1 % — ABNORMAL HIGH (ref 11.5–15.5)
WBC: 11.8 10*3/uL — ABNORMAL HIGH (ref 4.0–10.5)

## 2019-06-02 NOTE — Telephone Encounter (Signed)
Called patient and scheduled injection for 06/23/19

## 2019-06-02 NOTE — Telephone Encounter (Signed)
-----   Message from Larina Bras, [place] sent at 06/02/2019 11:29 AM EDT ----- Regarding: RE: hep injection Are you talking about her heplisav? If so, she should need her 2nd and final injection 1 month from her last injection which you can go ahead and schedule for her :) ----- Message ----- From: Lowell Guitar, RMA Sent: 06/02/2019  10:32 AM EDT To: Larina Bras, CMA Subject: hep injection                                  Patient will need another injection January 31,2021.  Thanks [employer]

## 2019-06-14 ENCOUNTER — Other Ambulatory Visit: Payer: Self-pay | Admitting: Internal Medicine

## 2019-06-15 ENCOUNTER — Other Ambulatory Visit: Payer: Self-pay

## 2019-06-15 DIAGNOSIS — K7460 Unspecified cirrhosis of liver: Secondary | ICD-10-CM

## 2019-06-16 ENCOUNTER — Other Ambulatory Visit: Payer: Self-pay | Admitting: Internal Medicine

## 2019-07-27 ENCOUNTER — Ambulatory Visit (INDEPENDENT_AMBULATORY_CARE_PROVIDER_SITE_OTHER): Payer: Medicare Other | Admitting: Internal Medicine

## 2019-07-27 ENCOUNTER — Other Ambulatory Visit (INDEPENDENT_AMBULATORY_CARE_PROVIDER_SITE_OTHER): Payer: Medicare Other

## 2019-07-27 DIAGNOSIS — Z23 Encounter for immunization: Secondary | ICD-10-CM

## 2019-07-27 DIAGNOSIS — K7460 Unspecified cirrhosis of liver: Secondary | ICD-10-CM | POA: Diagnosis not present

## 2019-07-27 LAB — PROTIME-INR
INR: 1.5 ratio — ABNORMAL HIGH (ref 0.8–1.0)
Prothrombin Time: 17.5 s — ABNORMAL HIGH (ref 9.6–13.1)

## 2019-07-27 LAB — HEPATIC FUNCTION PANEL
ALT: 16 U/L (ref 0–35)
AST: 34 U/L (ref 0–37)
Albumin: 3.6 g/dL (ref 3.5–5.2)
Alkaline Phosphatase: 117 U/L (ref 39–117)
Bilirubin, Direct: 0.7 mg/dL — ABNORMAL HIGH (ref 0.0–0.3)
Total Bilirubin: 3.2 mg/dL — ABNORMAL HIGH (ref 0.2–1.2)
Total Protein: 5.4 g/dL — ABNORMAL LOW (ref 6.0–8.3)

## 2019-07-27 NOTE — Progress Notes (Signed)
Administered 2nd Heplisav B injection. Pt tolerated well

## 2019-08-15 ENCOUNTER — Ambulatory Visit: Payer: Medicare Other

## 2019-08-16 ENCOUNTER — Encounter: Payer: Self-pay | Admitting: Podiatry

## 2019-08-16 ENCOUNTER — Other Ambulatory Visit: Payer: Self-pay | Admitting: Podiatry

## 2019-08-16 ENCOUNTER — Ambulatory Visit (INDEPENDENT_AMBULATORY_CARE_PROVIDER_SITE_OTHER): Payer: Medicare Other

## 2019-08-16 ENCOUNTER — Other Ambulatory Visit: Payer: Self-pay

## 2019-08-16 ENCOUNTER — Ambulatory Visit: Payer: Medicare Other | Admitting: Podiatry

## 2019-08-16 DIAGNOSIS — R600 Localized edema: Secondary | ICD-10-CM

## 2019-08-16 DIAGNOSIS — L600 Ingrowing nail: Secondary | ICD-10-CM

## 2019-08-16 DIAGNOSIS — M79672 Pain in left foot: Secondary | ICD-10-CM

## 2019-08-16 NOTE — Progress Notes (Signed)
Subjective:   Patient ID: Courtney Buckley, female   DOB: 68 y.o.   MRN: [id number]   HPI Patient states her right foot is feeling pretty good but her left foot has been swollen and she did traumatize it she is worried about a broken bone and she did discolor her big toenail left which chronically falls off and get sore   ROS      Objective:  Physical Exam  Neurovascular status intact with patient found to have edema in the left lateral foot with trauma and into the metatarsal phalangeal joints left which are painful.  The left hallux nail is very discolored and thickened and becomes painful when she wears shoes      Assessment:  Traumatized left foot with edema of the lateral side and MPJs with thickened nail disease left hallux that is moderately tender     Plan:  H&P condition reviewed and I went ahead today reviewed x-rays and I applied Unna boot to provide for compression of the foot along with surgical shoe usage.  Nail we discussed and ultimately I think will need to be removed and I educated her on this but will hold off currently  X-rays indicate that there is no signs of fracture appears to be soft tissue injury

## 2019-09-01 ENCOUNTER — Other Ambulatory Visit (INDEPENDENT_AMBULATORY_CARE_PROVIDER_SITE_OTHER): Payer: Medicare Other

## 2019-09-01 ENCOUNTER — Telehealth: Payer: Self-pay | Admitting: Internal Medicine

## 2019-09-01 DIAGNOSIS — K921 Melena: Secondary | ICD-10-CM

## 2019-09-01 LAB — CBC WITH DIFFERENTIAL/PLATELET
Basophils Absolute: 0.2 10*3/uL — ABNORMAL HIGH (ref 0.0–0.1)
Basophils Relative: 1.3 % (ref 0.0–3.0)
Eosinophils Absolute: 0.6 10*3/uL (ref 0.0–0.7)
Eosinophils Relative: 4.4 % (ref 0.0–5.0)
HCT: 40 % (ref 36.0–46.0)
Hemoglobin: 13.1 g/dL (ref 12.0–15.0)
Lymphocytes Relative: 11.2 % — ABNORMAL LOW (ref 12.0–46.0)
Lymphs Abs: 1.5 10*3/uL (ref 0.7–4.0)
MCHC: 32.9 g/dL (ref 30.0–36.0)
MCV: 80.1 fl (ref 78.0–100.0)
Monocytes Absolute: 0.5 10*3/uL (ref 0.1–1.0)
Monocytes Relative: 3.8 % (ref 3.0–12.0)
Neutro Abs: 10.6 10*3/uL — ABNORMAL HIGH (ref 1.4–7.7)
Neutrophils Relative %: 79.3 % — ABNORMAL HIGH (ref 43.0–77.0)
Platelets: 471 10*3/uL — ABNORMAL HIGH (ref 150.0–400.0)
RBC: 4.99 Mil/uL (ref 3.87–5.11)
RDW: 25.7 % — ABNORMAL HIGH (ref 11.5–15.5)
WBC: 13.4 10*3/uL — ABNORMAL HIGH (ref 4.0–10.5)

## 2019-09-01 LAB — COMPREHENSIVE METABOLIC PANEL
ALT: 13 U/L (ref 0–35)
AST: 25 U/L (ref 0–37)
Albumin: 3.4 g/dL — ABNORMAL LOW (ref 3.5–5.2)
Alkaline Phosphatase: 103 U/L (ref 39–117)
BUN: 27 mg/dL — ABNORMAL HIGH (ref 6–23)
CO2: 26 mEq/L (ref 19–32)
Calcium: 8.8 mg/dL (ref 8.4–10.5)
Chloride: 110 mEq/L (ref 96–112)
Creatinine, Ser: 0.61 mg/dL (ref 0.40–1.20)
GFR: 97.05 mL/min (ref >60.00)
Glucose, Bld: 96 mg/dL (ref 70–99)
Potassium: 4.4 mEq/L (ref 3.5–5.1)
Sodium: 144 mEq/L (ref 135–145)
Total Bilirubin: 2.3 mg/dL — ABNORMAL HIGH (ref 0.2–1.2)
Total Protein: 5.2 g/dL — ABNORMAL LOW (ref 6.0–8.3)

## 2019-09-01 LAB — PROTIME-INR
INR: 1.5 ratio — ABNORMAL HIGH (ref 0.8–1.0)
Prothrombin Time: 17 s — ABNORMAL HIGH (ref 9.6–13.1)

## 2019-09-01 NOTE — Telephone Encounter (Signed)
Stop NSAIDs Check CBC, CMP, INR Stay with BID pantoprazole 40 mg for next 2 weeks If black stools or medium to large red or maroon stools then she needs to go to the ER  Please send labs to doctor of the day this PM, as I am off and will be away from computer assess

## 2019-09-01 NOTE — Telephone Encounter (Signed)
Spoke with pt and she is aware and will come for labs. Labs ordered under Dr. Lyndel Safe as DOD

## 2019-09-01 NOTE — Telephone Encounter (Signed)
Pt states she had some dental issues and had to have a tooth pulled. She was taking motin 800mg  several times a day. Yesterday she had diarrhea that was quite dark but there was some reddish color also. Now her diarrhea has slowed but there is still reddish color present. Pt is concerned. She takes protonix daily but this am she took 2 of the protonix. Pt wants to know what else she should do. Please advise.

## 2019-09-21 ENCOUNTER — Ambulatory Visit: Payer: Medicare Other | Admitting: Internal Medicine

## 2020-04-03 ENCOUNTER — Ambulatory Visit: Payer: Medicare PPO | Admitting: Nurse Practitioner

## 2020-04-03 ENCOUNTER — Other Ambulatory Visit (INDEPENDENT_AMBULATORY_CARE_PROVIDER_SITE_OTHER): Payer: Medicare PPO

## 2020-04-03 ENCOUNTER — Encounter: Payer: Self-pay | Admitting: Nurse Practitioner

## 2020-04-03 VITALS — BP 124/62 | HR 69 | Ht 67.0 in | Wt 180.0 lb

## 2020-04-03 DIAGNOSIS — R634 Abnormal weight loss: Secondary | ICD-10-CM

## 2020-04-03 DIAGNOSIS — K7460 Unspecified cirrhosis of liver: Secondary | ICD-10-CM

## 2020-04-03 DIAGNOSIS — R110 Nausea: Secondary | ICD-10-CM

## 2020-04-03 DIAGNOSIS — D729 Disorder of white blood cells, unspecified: Secondary | ICD-10-CM

## 2020-04-03 DIAGNOSIS — L659 Nonscarring hair loss, unspecified: Secondary | ICD-10-CM

## 2020-04-03 LAB — COMPREHENSIVE METABOLIC PANEL
ALT: 19 U/L (ref 0–35)
AST: 38 U/L — ABNORMAL HIGH (ref 0–37)
Albumin: 3.6 g/dL (ref 3.5–5.2)
Alkaline Phosphatase: 113 U/L (ref 39–117)
BUN: 11 mg/dL (ref 6–23)
CO2: 28 mEq/L (ref 19–32)
Calcium: 8.7 mg/dL (ref 8.4–10.5)
Chloride: 109 mEq/L (ref 96–112)
Creatinine, Ser: 0.57 mg/dL (ref 0.40–1.20)
GFR: 104.77 mL/min (ref >60.00)
Glucose, Bld: 92 mg/dL (ref 70–99)
Potassium: 3.9 mEq/L (ref 3.5–5.1)
Sodium: 141 mEq/L (ref 135–145)
Total Bilirubin: 3.1 mg/dL — ABNORMAL HIGH (ref 0.2–1.2)
Total Protein: 5.4 g/dL — ABNORMAL LOW (ref 6.0–8.3)

## 2020-04-03 LAB — CBC WITH DIFFERENTIAL/PLATELET
Basophils Absolute: 0.1 10*3/uL (ref 0.0–0.1)
Basophils Relative: 1.2 % (ref 0.0–3.0)
Eosinophils Absolute: 0.4 10*3/uL (ref 0.0–0.7)
Eosinophils Relative: 3.7 % (ref 0.0–5.0)
HCT: 41.9 % (ref 36.0–46.0)
Hemoglobin: 13.7 g/dL (ref 12.0–15.0)
Lymphocytes Relative: 10.3 % — ABNORMAL LOW (ref 12.0–46.0)
Lymphs Abs: 1.2 10*3/uL (ref 0.7–4.0)
MCHC: 32.8 g/dL (ref 30.0–36.0)
MCV: 76.3 fl — ABNORMAL LOW (ref 78.0–100.0)
Monocytes Absolute: 0.4 10*3/uL (ref 0.1–1.0)
Monocytes Relative: 3.1 % (ref 3.0–12.0)
Neutro Abs: 9.6 10*3/uL — ABNORMAL HIGH (ref 1.4–7.7)
Neutrophils Relative %: 81.7 % — ABNORMAL HIGH (ref 43.0–77.0)
Platelets: 418 10*3/uL — ABNORMAL HIGH (ref 150.0–400.0)
RBC: 5.49 Mil/uL — ABNORMAL HIGH (ref 3.87–5.11)
RDW: 26 % — ABNORMAL HIGH (ref 11.5–15.5)
WBC: 11.8 10*3/uL — ABNORMAL HIGH (ref 4.0–10.5)

## 2020-04-03 LAB — PROTIME-INR
INR: 1.5 ratio — ABNORMAL HIGH (ref 0.8–1.0)
Prothrombin Time: 17.1 s — ABNORMAL HIGH (ref 9.6–13.1)

## 2020-04-03 LAB — TSH: TSH: 2.2 u[IU]/mL (ref 0.35–4.50)

## 2020-04-03 MED ORDER — ONDANSETRON HCL 4 MG PO TABS: 4.0000 mg | ORAL_TABLET | Freq: Two times a day (BID) | ORAL | 2 refills | Status: DC | PRN

## 2020-04-03 NOTE — Patient Instructions (Addendum)
If you are age 70 or older, your body mass index should be between 23-30. Your Body mass index is 28.19 kg/m. If this is out of the aforementioned range listed, please consider follow up with your Primary Care Provider.  If you are age 26 or younger, your body mass index should be between 19-25. Your Body mass index is 28.19 kg/m. If this is out of the aformentioned range listed, please consider follow up with your Primary Care Provider.   Your provider has requested that you go to the basement level for lab work before leaving today. Press "B" on the elevator. The lab is located at the first door on the left as you exit the elevator.    Resume taking Corgard 20 mg daily. Monitor pulse rate if it falls below 60 bmp call the office.   Zofran 4 mg every 12 hour as needed.   You have been scheduled for an abdominal ultrasound at [facility] (1st floor of hospital) on Wednesday 04/10/20 at 10 am. Please arrive 15 minutes prior to your appointment for registration. Make certain not to have anything to eat or drink 6 hours prior to your appointment. Should you need to reschedule your appointment, please contact radiology at [phone]. This test typically takes about 30 minutes to perform.

## 2020-04-04 LAB — AFP TUMOR MARKER: AFP-Tumor Marker: 4.9 ng/mL

## 2020-04-04 LAB — PATHOLOGIST SMEAR REVIEW

## 2020-04-04 NOTE — Progress Notes (Addendum)
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 Reason for this appt.  --Resolved.  Etiology unclear but at this point there is no need for further work-up since bowel movements are back to normal  # Cirrhosis with portal HTN --Appears compensated.  She needs follow-up with Dr. Hilarie Fredrickson, it has been over a year since her last visit with him. --She did get her hepatitis B vaccine. --Ultrasound for [facility] screening is overdue.  Will obtain ultrasound to evaluate for [facility] --She needs updated labs.  Will check CMP, CBC, INR, AFP.  She complains of thinning hair/hair loss/ weight loss so will obtain a TSH.  --Patient cannot remember why she stopped Corgard.  She thinks it could be nausea. However, she has been intermittently nauseated while not on the medication so hopefully this is not the case.  I have asked her to resume Corgard 20 mg daily.  I also taught her how to check her heart rate.  Advised to hold Cologuard and call the office if heart rate under 60.  --Had patient remained on the Corgard she probably would not have needed an EGD for evaluation of known varices.  However, since she has been off Corgard for over a year is probably prudent to repeat EGD for evaluation of varices and see if there has been any progression.  Dr. Hilarie Fredrickson can cancel the EGD if he feels it is not warranted. The risks and benefits of EGD were discussed and the patient agrees to proceed.  --Follow up with Dr. Hilarie Fredrickson in 3 months.   # Nausea,chronic,  intermiitent --Zofran prn  # Weight loss,  partly unintentional --Weight down 30 pounds since April 2020.  The first 20 pounds was unintentional but now trying to continue with the weight loss. --She looks and feels well but need to monitor for ongoing weight loss if becomes unintentional. For now, await labs and EGD.  --She is up to date on CRC screening (  2020)  HPI:    Primary GI: Courtney Jarred, MD   Chief complaint : Diarrhea - already resolved   69 yo old female followed by Dr. Richardine Service for cirrhosis.  Patient made this appointment for evaluation of diarrhea.  Patient was visiting [place] several weeks ago when she developed non-bloody diarrhea.  No associated abdominal pain or fever. No recent antibiotics.  Diarrhea spontaneously resolved but then she had recurrent diarrhea 2 weeks later.  She took a few doses of  Imodium, no diarrhea in 2 weeks.  Bowel movements are back to normal. She did have some problems all day on Mother's Day but this passed. Today she has also had some mild nausea. Appetite is okay.    Courtney Buckley is up-to-date on her colon cancer screening.  Last 12/03/2018 with findings of an ascending colon polyp (biopsy >> inflammatory polyp) and hemorrhoids.  Courtney Buckley has not had a recent follow-up with Dr. Casandra Doffing regarding cirrhosis.  She appears compensated.  She has not been taking Nadolol for unclear reasons but perhaps nausea.  Patient cannot even remember ever taking Nadolol  Review of systems:     No chest pain, no SOB, no fevers, no urinary sx   Past Medical History:  Diagnosis Date  . Cirrhosis ([place])   . Colon polyps   . Depression   . Esophageal varices ([place])   . Gastritis   .  Internal hemorrhoids   . Migraine   . Portal hypertensive gastropathy ([place])   . PVT (portal vein thrombosis)     Patient's surgical history, family medical history, social history, medications and allergies were all reviewed in Epic   Serum creatinine: 0.57 mg/dL 04/03/20 1116 Estimated creatinine clearance: 71.9 mL/min  Current Outpatient Medications  Medication Sig Dispense Refill  . nadolol (CORGARD) 20 MG tablet TAKE 1 TABLET (20 MG TOTAL) BY MOUTH DAILY. DOSE TO BE ADJUSTED BY DR. PYRTLE AS NEEDED. 90 tablet 0  . pantoprazole (PROTONIX) 40 MG tablet TAKE 1 TABLET (40 MG TOTAL) BY MOUTH 2 TIMES DAILY BEFORE THE 1ST MEAL AND LAST MEAL OF THE  DAY. 180 tablet 2  . ondansetron (ZOFRAN) 4 MG tablet Take 1 tablet (4 mg total) by mouth every 12 (twelve) hours as needed for nausea or vomiting. 40 tablet 2   No current facility-administered medications for this visit.    Physical Exam:     BP 124/62   Pulse 69   Ht 5\' 7"  (1.702 m)   Wt 180 lb (81.6 kg)   BMI 28.19 kg/m   GENERAL:  Pleasant female in NAD PSYCH: : Cooperative, normal affect CARDIAC:  RRR PULM: Normal respiratory effort, lungs CTA bilaterally, no wheezing ABDOMEN:  Nondistended, soft, nontender. Large LUQ mass, known splenomegaly. Normal bowel sounds SKIN:  turgor, no lesions seen Musculoskeletal:  Normal muscle tone, normal strength NEURO: Alert and oriented x 3, no focal neurologic deficits   Tye Savoy , NP 04/04/2020, 2:09 PM

## 2020-04-08 ENCOUNTER — Encounter: Payer: Self-pay | Admitting: Nurse Practitioner

## 2020-04-09 NOTE — Progress Notes (Signed)
Addendum: Reviewed and agree with assessment and management plan. Aubria Vanecek M, MD  

## 2020-04-10 ENCOUNTER — Other Ambulatory Visit: Payer: Self-pay

## 2020-04-10 ENCOUNTER — Ambulatory Visit (HOSPITAL_COMMUNITY)
Admission: RE | Admit: 2020-04-10 | Discharge: 2020-04-10 | Disposition: A | Discharge status: Home / Self Care | Payer: Medicare PPO | Source: Ambulatory Visit | Attending: Nurse Practitioner | Admitting: Nurse Practitioner

## 2020-04-10 DIAGNOSIS — R634 Abnormal weight loss: Secondary | ICD-10-CM

## 2020-04-10 DIAGNOSIS — R110 Nausea: Secondary | ICD-10-CM

## 2020-04-10 DIAGNOSIS — K7460 Unspecified cirrhosis of liver: Secondary | ICD-10-CM

## 2020-04-18 ENCOUNTER — Telehealth: Payer: Self-pay | Admitting: Nurse Practitioner

## 2020-04-18 NOTE — Telephone Encounter (Signed)
Patient experiences nausea with Corgard. Because of this, she started taking the Zofran with it. She is not vomiting. Despite taking them together, she becomes nauseous again  about 2 hours later as if the Zofran had "worn off.". States she remembers Corgard caused her to have GI upset when she took it before. She is asking for something different to help her with nausea. Also states her pulse is remaining above 60. Please advise.

## 2020-04-18 NOTE — Telephone Encounter (Signed)
Pt reported that ondansetron is not helping her nausea.

## 2020-04-22 ENCOUNTER — Other Ambulatory Visit: Payer: Self-pay

## 2020-04-22 ENCOUNTER — Telehealth: Payer: Self-pay | Admitting: Internal Medicine

## 2020-04-22 MED ORDER — PROPRANOLOL HCL 10 MG PO TABS: 10.0000 mg | ORAL_TABLET | Freq: Three times a day (TID) | ORAL | 1 refills | Status: DC

## 2020-04-22 NOTE — Telephone Encounter (Signed)
Courtney Buckley would have her stop nadolol and add this to intolerances side effect is nausea (this was not an allergy) She can try propranolol 10 mg TID, this will likely need to titrated as well to target resting HR of around 60 as tolerated by BP and overall how she is feeling

## 2020-04-22 NOTE — Telephone Encounter (Signed)
Sorry for confusion She would start 10 mg 3 times daily from the beginning Future titration would be to increase the dose likely to 20 mg 3 times daily if needed to achieve our goal heart rate She would not do this without first being instructed to do so by me or one of our providers

## 2020-04-22 NOTE — Telephone Encounter (Signed)
Patient is on board with this change of medication and thanks you for this. Clarify for me the instructions. She is to start with 3 times a day and adjust it from there? Or start daily and titrate up to 3 times a day? Thanks

## 2020-04-22 NOTE — Telephone Encounter (Signed)
Got it. Explained this to the patient. Rx to her pharmacy. She will start it tomorrow. She is scheduled for a follow up appointment with you in July.

## 2020-05-15 ENCOUNTER — Other Ambulatory Visit: Payer: Self-pay | Admitting: Internal Medicine

## 2020-05-31 ENCOUNTER — Other Ambulatory Visit (INDEPENDENT_AMBULATORY_CARE_PROVIDER_SITE_OTHER): Payer: Medicare PPO

## 2020-05-31 ENCOUNTER — Ambulatory Visit: Payer: Medicare PPO | Admitting: Internal Medicine

## 2020-05-31 ENCOUNTER — Encounter: Payer: Self-pay | Admitting: Internal Medicine

## 2020-05-31 VITALS — BP 122/74 | HR 61 | Ht 66.0 in | Wt 177.0 lb

## 2020-05-31 DIAGNOSIS — R110 Nausea: Secondary | ICD-10-CM

## 2020-05-31 DIAGNOSIS — K7460 Unspecified cirrhosis of liver: Secondary | ICD-10-CM

## 2020-05-31 DIAGNOSIS — Z8711 Personal history of peptic ulcer disease: Secondary | ICD-10-CM

## 2020-05-31 DIAGNOSIS — R188 Other ascites: Secondary | ICD-10-CM

## 2020-05-31 DIAGNOSIS — R718 Other abnormality of red blood cells: Secondary | ICD-10-CM

## 2020-05-31 LAB — IBC + FERRITIN
Ferritin: 8.3 ng/mL — ABNORMAL LOW (ref 10.0–291.0)
Iron: 74 ug/dL (ref 42–145)
Saturation Ratios: 20.3 % (ref 20.0–50.0)
Transferrin: 260 mg/dL (ref 212.0–360.0)

## 2020-05-31 LAB — COMPREHENSIVE METABOLIC PANEL
ALT: 19 U/L (ref 0–35)
AST: 38 U/L — ABNORMAL HIGH (ref 0–37)
Albumin: 3.5 g/dL (ref 3.5–5.2)
Alkaline Phosphatase: 118 U/L — ABNORMAL HIGH (ref 39–117)
BUN: 16 mg/dL (ref 6–23)
CO2: 28 mEq/L (ref 19–32)
Calcium: 8.8 mg/dL (ref 8.4–10.5)
Chloride: 110 mEq/L (ref 96–112)
Creatinine, Ser: 0.65 mg/dL (ref 0.40–1.20)
GFR: 90 mL/min (ref >60.00)
Glucose, Bld: 92 mg/dL (ref 70–99)
Potassium: 4.3 mEq/L (ref 3.5–5.1)
Sodium: 142 mEq/L (ref 135–145)
Total Bilirubin: 3.8 mg/dL — ABNORMAL HIGH (ref 0.2–1.2)
Total Protein: 5.4 g/dL — ABNORMAL LOW (ref 6.0–8.3)

## 2020-05-31 LAB — CBC WITH DIFFERENTIAL/PLATELET
Basophils Absolute: 0.1 10*3/uL (ref 0.0–0.1)
Basophils Relative: 0.3 % (ref 0.0–3.0)
Eosinophils Absolute: 0.6 10*3/uL (ref 0.0–0.7)
Eosinophils Relative: 4.4 % (ref 0.0–5.0)
HCT: 42.7 % (ref 36.0–46.0)
Hemoglobin: 14.1 g/dL (ref 12.0–15.0)
Lymphocytes Relative: 10.6 % — ABNORMAL LOW (ref 12.0–46.0)
Lymphs Abs: 1.6 10*3/uL (ref 0.7–4.0)
MCHC: 32.9 g/dL (ref 30.0–36.0)
MCV: 76.3 fl — ABNORMAL LOW (ref 78.0–100.0)
Monocytes Absolute: 0.6 10*3/uL (ref 0.1–1.0)
Monocytes Relative: 3.9 % (ref 3.0–12.0)
Neutro Abs: 12 10*3/uL — ABNORMAL HIGH (ref 1.4–7.7)
Neutrophils Relative %: 80.8 % — ABNORMAL HIGH (ref 43.0–77.0)
Platelets: 476 10*3/uL — ABNORMAL HIGH (ref 150.0–400.0)
RBC: 5.6 Mil/uL — ABNORMAL HIGH (ref 3.87–5.11)
RDW: 25.4 % — ABNORMAL HIGH (ref 11.5–15.5)
WBC: 14.8 10*3/uL — ABNORMAL HIGH (ref 4.0–10.5)

## 2020-05-31 LAB — FOLATE: Folate: >24.8 ng/mL (ref >5.9)

## 2020-05-31 LAB — PROTIME-INR
INR: 1.6 ratio — ABNORMAL HIGH (ref 0.8–1.0)
Prothrombin Time: 17.8 s — ABNORMAL HIGH (ref 9.6–13.1)

## 2020-05-31 LAB — VITAMIN B12: Vitamin B-12: 1131 pg/mL — ABNORMAL HIGH (ref 211–911)

## 2020-05-31 MED ORDER — ONDANSETRON 4 MG PO TBDP: 4.0000 mg | ORAL_TABLET | Freq: Three times a day (TID) | ORAL | 0 refills | Status: DC | PRN

## 2020-05-31 NOTE — Progress Notes (Signed)
Subjective:    Patient ID: Courtney Buckley, female    DOB: May 20, 1951, 69 y.o.   MRN: [id number]  HPI Courtney Buckley is a 69 year old female with a history of cirrhosis of the liver with portal hypertension (varices, portal gastropathy, low volume abdominal ascites), chronic portal vein thrombus, history of peptic ulcer disease (felt to be NSAID related), history of colon polyps who is seen for follow-up.  She was seen approximately 2 months ago by Tye Savoy, NP.  She reports that she continues to struggle with nausea on a daily basis.  She is not vomiting but the nausea can be significant and is worse when she eats.  She was restarted on nadolol and this did not worsen or improve her nausea symptoms.  She is also had intermittent dizziness but no falling.  No confusion.  Nadolol also did not contribute or change her dizziness feeling.  Her bowel movements have been normal without blood in her stool or melena.  She has noticed that her hair is falling out which concerns her.  She has lost 33 pounds overall the first 25 pounds completely unintentionally.  Her appetite is just not been normal.  She is avoiding all NSAIDs though she admits to in her past using high-dose ibuprofen for migraines.  Fortunately headaches have not been an issue of late.  She has not noticed increased abdominal swelling or lower extremity edema.  She does notice dark urine without dysuria.  She had an ultrasound after her visit with Nevin Bloodgood  Review of Systems As per HPI, otherwise negative  Current Medications, Allergies, Past Medical History, Past Surgical History, Family History and Social History were reviewed in [employer] record.     Objective:   Physical Exam BP 122/74   Pulse 61   Ht 5\' 6"  (1.676 m)   Wt 177 lb (80.3 kg)   BMI 28.57 kg/m  Gen: awake, alert, NAD HEENT: Mild scleral icterus CV: RRR, no mrg Pulm: CTA b/l Abd: soft, NT/ND, +BS throughout, no appreciable  ascites Ext: no c/c/e, positive palmar erythema Neuro: nonfocal, no asterixis, alert and oriented x4  CMP     Component Value Date/Time   NA 141 04/03/2020 1116   K 3.9 04/03/2020 1116   CL 109 04/03/2020 1116   CO2 28 04/03/2020 1116   GLUCOSE 92 04/03/2020 1116   BUN 11 04/03/2020 1116   CREATININE 0.57 04/03/2020 1116   CALCIUM 8.7 04/03/2020 1116   PROT 5.4 (L) 04/03/2020 1116   ALBUMIN 3.6 04/03/2020 1116   AST 38 (H) 04/03/2020 1116   ALT 19 04/03/2020 1116   ALKPHOS 113 04/03/2020 1116   BILITOT 3.1 (H) 04/03/2020 1116   Lab Results  Component Value Date   INR 1.5 (H) 04/03/2020   INR 1.5 (H) 09/01/2019   INR 1.5 (H) 07/27/2019   CBC    Component Value Date/Time   WBC 11.8 (H) 04/03/2020 1116   RBC 5.49 (H) 04/03/2020 1116   HGB 13.7 04/03/2020 1116   HGB 13.8 01/16/2015 0836   HCT 41.9 04/03/2020 1116   HCT 44.6 01/16/2015 0836   PLT 418.0 (H) 04/03/2020 1116   PLT 614 (H) 01/16/2015 0836   MCV 76.3 (L) 04/03/2020 1116   MCV 74.0 (L) 01/16/2015 0836   MCH 22.9 (L) 01/16/2015 0836   MCHC 32.8 04/03/2020 1116   RDW 26.0 (H) 04/03/2020 1116   RDW 24.6 (H) 01/16/2015 0836   LYMPHSABS 1.2 04/03/2020 1116   LYMPHSABS 1.3 01/16/2015  0836   MONOABS 0.4 04/03/2020 1116   MONOABS 0.4 01/16/2015 0836   EOSABS 0.4 04/03/2020 1116   EOSABS 0.4 01/16/2015 0836   BASOSABS 0.1 04/03/2020 1116   BASOSABS 0.0 01/16/2015 0836       Assessment & Plan:  69 year old female with a history of cirrhosis of the liver with portal hypertension (varices, portal gastropathy, low volume abdominal ascites), chronic portal vein thrombus, history of peptic ulcer disease (felt to be NSAID related), history of colon polyps who is seen for follow-up.  1.  Cirrhosis with portal hypertension/chronic portal vein thrombosis/esophageal varices --I am concerned about her weight loss and ongoing nausea.  She had [facility] screening ultrasound which was stable and prior to this had had  cross-sectional imaging with MRI which did not show any evidence for [facility].  She does have some element of decompensation of her liver disease with small volume ascites by ultrasound.  Fortunately there is no evidence for lower extremity edema, encephalopathy or bleeding.  Her bilirubin is slightly elevated and overall her meld score as of last month was 15. We are going to proceed as follows:  --Repeat upper endoscopy to evaluate ongoing nausea and weight loss; rule out persistent peptic ulcer disease --She is on nadolol and heart rate is appropriate on current dose, continue 20 mg a day, will evaluate esophageal varices at the time of upper endoscopy but if she continues beta-blocker she will not need routine surveillance --Trace ascites by ultrasound but no appreciable ascites by exam or lower extremity edema, monitor closely --No current issues with hepatic encephalopathy --HCC screening up-to-date but if no improvement consider repeat cross-sectional imaging with MRI, would recommend MRI abdomen with and without contrast for [facility] screening either way in December 2021 --At follow-up we will discuss consideration of referral for consideration of transplant evaluation given meld score 15 though would like to discuss this with her more before referral --Zofran 4 mg ODT 3 times daily as needed for nausea  2. History of peptic ulcer disease --as above, she is off NSAIDs. Continue pantoprazole 40 mg daily  3. Microcytosis/hair loss --I am concerned about possible iron deficiency, check iron studies today, check B12 and folate  30 minutes total spent today including patient facing time, coordination of care, reviewing medical history/procedures/pertinent radiology studies, and documentation of the encounter.

## 2020-05-31 NOTE — Patient Instructions (Signed)
You have been scheduled for an endoscopy. Please follow written instructions given to you at your visit today. If you use inhalers (even only as needed), please bring them with you on the day of your procedure.  Your provider has requested that you go to the basement level for lab work before leaving today. Press "B" on the elevator. The lab is located at the first door on the left as you exit the elevator.  We have sent the following medications to your pharmacy for you to pick up at your convenience: ondansentron odt 4 mg three times daily as needed  If you are age 46 or older, your body mass index should be between 23-30. Your Body mass index is 28.57 kg/m. If this is out of the aforementioned range listed, please consider follow up with your Primary Care Provider.  If you are age 38 or younger, your body mass index should be between 19-25. Your Body mass index is 28.57 kg/m. If this is out of the aformentioned range listed, please consider follow up with your Primary Care Provider.   Due to recent changes in healthcare laws, you may see the results of your imaging and laboratory studies on MyChart before your provider has had a chance to review them.  We understand that in some cases there may be results that are confusing or concerning to you. Not all laboratory results come back in the same time frame and the provider may be waiting for multiple results in order to interpret others.  Please give [place] 48 hours in order for your provider to thoroughly review all the results before contacting the office for clarification of your results.

## 2020-06-03 ENCOUNTER — Other Ambulatory Visit: Payer: Self-pay

## 2020-06-03 MED ORDER — FERROUS SULFATE 325 (65 FE) MG PO TABS: 325.0000 mg | ORAL_TABLET | Freq: Every day | ORAL | 3 refills | Status: DC

## 2020-06-12 ENCOUNTER — Other Ambulatory Visit: Payer: Self-pay | Admitting: Internal Medicine

## 2020-06-13 ENCOUNTER — Telehealth: Payer: Self-pay | Admitting: Internal Medicine

## 2020-06-13 NOTE — Telephone Encounter (Signed)
Courtney Buckley pt calling, history of cirrhosis. Pt was seen 05/31/20, OV note states pt was taking nadolol. Pt reports she was supposed to be taking it but she wasn't, states she just started it after the visit on 05/31/20. On 8/2 she was started on Ferrous sulfate 325mg  daily. OV note states pt has been having problems with nausea. She is calling stating that she is having issues with dizziness esp when she stands up and she is more nauseated, her speech is also slurred. Pt reports she thinks it is slurred because she feels so bad and is to tired. Reports she held the iron this am and just took the nadolol and she still feels bad, she thinks the nadolol is causing her symptoms. Dr. Loletha Carrow as DOD please advise.

## 2020-06-13 NOTE — Telephone Encounter (Signed)
I am not certain the Nadolol is causing the trouble, after having read the history of these chronic symptoms in Dr. Vena Rua July 30th office note.  Given the status and history of her liver condition, we would not want to stop the nadolol unless it was causing the blood pressure or pulse to be too low.  My advice is that she come by the office today or tomorrow when nursing or Dr. Vena Rua CMA is available to take orthostatic BP and pulse, then let me know and we will proceed accordingly.  - HD

## 2020-06-13 NOTE — Telephone Encounter (Signed)
Patient called states the medication Nadolol is making her sick to her stomach please advise

## 2020-06-13 NOTE — Telephone Encounter (Signed)
Spoke with pt and she is aware, she will come tomorrow at 10am for BP/pulse check. Dr. Loletha Carrow aware.

## 2020-06-14 ENCOUNTER — Ambulatory Visit: Payer: Medicare PPO | Admitting: Gastroenterology

## 2020-06-14 DIAGNOSIS — K7460 Unspecified cirrhosis of liver: Secondary | ICD-10-CM

## 2020-06-14 DIAGNOSIS — R188 Other ascites: Secondary | ICD-10-CM
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
## 2020-06-14 NOTE — Telephone Encounter (Signed)
See "clinical support" note from 06/14/20 for further information regarding orthostatic blood pressure and pulse readings as well as additional recommendations made by Dr Loletha Carrow.

## 2020-07-11 ENCOUNTER — Encounter: Payer: Self-pay | Admitting: Internal Medicine

## 2020-07-11 ENCOUNTER — Other Ambulatory Visit: Payer: Self-pay

## 2020-07-11 ENCOUNTER — Ambulatory Visit (AMBULATORY_SURGERY_CENTER): Payer: Medicare PPO | Admitting: Internal Medicine

## 2020-07-11 VITALS — BP 118/58 | HR 78 | Temp 97.7°F | Resp 16 | Ht 66.0 in | Wt 177.0 lb

## 2020-07-11 DIAGNOSIS — K31819 Angiodysplasia of stomach and duodenum without bleeding: Secondary | ICD-10-CM

## 2020-07-11 DIAGNOSIS — K2970 Gastritis, unspecified, without bleeding: Secondary | ICD-10-CM | POA: Diagnosis not present

## 2020-07-11 DIAGNOSIS — K7460 Unspecified cirrhosis of liver: Secondary | ICD-10-CM

## 2020-07-11 DIAGNOSIS — R188 Other ascites: Secondary | ICD-10-CM

## 2020-07-11 DIAGNOSIS — I8500 Esophageal varices without bleeding: Secondary | ICD-10-CM | POA: Diagnosis not present

## 2020-07-11 DIAGNOSIS — I8510 Secondary esophageal varices without bleeding: Secondary | ICD-10-CM

## 2020-07-11 DIAGNOSIS — R634 Abnormal weight loss: Secondary | ICD-10-CM

## 2020-07-11 DIAGNOSIS — R110 Nausea: Secondary | ICD-10-CM

## 2020-07-11 MED ORDER — SODIUM CHLORIDE 0.9 % IV SOLN: 500.0000 mL | Freq: Once | INTRAVENOUS | Status: DC

## 2020-07-11 NOTE — Op Note (Signed)
[place] Patient Name: Courtney Buckley Procedure Date: 07/11/2020 9:50 AM MRN: [id number] Endoscopist: Jerene Bears , MD Age: 69 Referring MD:  Date of Birth: 1951-03-02 Gender: Female Account #: [id number] Procedure:                Upper GI endoscopy Indications:              Esophageal varices, Nausea, Weight loss, recent                            hypotension/presyncopal symptoms improving after                            stopping beta-blocker (Nadolol) which was started                            in early 2020 when esophageal varices were found on                            index EGD Feb 2020. Medicines:                Monitored Anesthesia Care Procedure:                Pre-Anesthesia Assessment:                           - Prior to the procedure, a History and Physical                            was performed, and patient medications and                            allergies were reviewed. The patient's tolerance of                            previous anesthesia was also reviewed. The risks                            and benefits of the procedure and the sedation                            options and risks were discussed with the patient.                            All questions were answered, and informed consent                            was obtained. Prior Anticoagulants: The patient has                            taken no previous anticoagulant or antiplatelet                            agents. ASA Grade Assessment: III - A patient with  severe systemic disease. After reviewing the risks                            and benefits, the patient was deemed in                            satisfactory condition to undergo the procedure.                           After obtaining informed consent, the endoscope was                            passed under direct vision. Throughout the                            procedure, the patient's blood  pressure, pulse, and                            oxygen saturations were monitored continuously. The                            Endoscope was introduced through the mouth, and                            advanced to the second part of duodenum. The upper                            GI endoscopy was accomplished without difficulty.                            The patient tolerated the procedure well. Scope In: Scope Out: Findings:                 Grade II, small (< 5 mm) varices were found in the                            lower third of the esophagus. No stigmata of                            bleeding.                           The cardia and gastric fundus were normal on                            retroflexion. No evidence of gastric varices.                           A single 4 mm angioectasia with no bleeding was                            found in the gastric body.                           Mild  inflammation characterized by congestion                            (edema) and erythema was found in the gastric                            antrum. There is antral scarring from prior ulcer                            disease, but no evidence of persistent ulceration                            or erosion.                           The examined duodenum was normal. Complications:            No immediate complications. Estimated Blood Loss:     Estimated blood loss: none. Impression:               - Grade II and small (< 5 mm) esophageal varices.                           - A single non-bleeding angioectasia in the stomach.                           - Mild antral gastritis with scarring from prior                            ulcer disease without evidence of persistent                            ulceration or erosion.                           - Normal examined duodenum.                           - No specimens collected. Recommendation:           - Patient has a contact number available for                             emergencies. The signs and symptoms of potential                            delayed complications were discussed with the                            patient. Return to normal activities tomorrow.                            Written discharge instructions were provided to the                            patient.                           -  Resume previous low sodium diet.                           - Continue present medications. Given intolerance                            to beta-blocker will not resume nadolol.                           - Repeat upper endoscopy in 2 years for variceal                            surveillance.                           - Office follow-up in 3-4 months. Jerene Bears, MD 07/11/2020 10:29:47 AM This report has been signed electronically.

## 2020-07-11 NOTE — Patient Instructions (Signed)
Please read handouts provided. Continue present medications. Given intolerance to beta-blocker will not resume nadolol. Repeat upper endoscopy in 2 years for variceal surveillance. Office follow-up in 3-4 months.      YOU HAD AN ENDOSCOPIC PROCEDURE TODAY AT [facility]:   Refer to the procedure report that was given to you for any specific questions about what was found during the examination.  If the procedure report does not answer your questions, please call your gastroenterologist to clarify.  If you requested that your care partner not be given the details of your procedure findings, then the procedure report has been included in a sealed envelope for you to review at your convenience later.  YOU SHOULD EXPECT: Some feelings of bloating in the abdomen. Passage of more gas than usual.  Walking can help get rid of the air that was put into your GI tract during the procedure and reduce the bloating. If you had a lower endoscopy (such as a colonoscopy or flexible sigmoidoscopy) you may notice spotting of blood in your stool or on the toilet paper. If you underwent a bowel prep for your procedure, you may not have a normal bowel movement for a few days.  Please Note:  You might notice some irritation and congestion in your nose or some drainage.  This is from the oxygen used during your procedure.  There is no need for concern and it should clear up in a day or so.  SYMPTOMS TO REPORT IMMEDIATELY:    Following upper endoscopy (EGD)  Vomiting of blood or coffee ground material  New chest pain or pain under the shoulder blades  Painful or persistently difficult swallowing  New shortness of breath  Fever of 100F or higher  Black, tarry-looking stools  For urgent or emergent issues, a gastroenterologist can be reached at any hour by calling [phone]. Do not use MyChart messaging for urgent concerns.    DIET:  We do recommend a small meal at first, but then you may  proceed to your regular diet.  Drink plenty of fluids but you should avoid alcoholic beverages for 24 hours.  ACTIVITY:  You should plan to take it easy for the rest of today and you should NOT DRIVE or use heavy machinery until tomorrow (because of the sedation medicines used during the test).    FOLLOW UP: Our staff will call the number listed on your records 48-72 hours following your procedure to check on you and address any questions or concerns that you may have regarding the information given to you following your procedure. If we do not reach you, we will leave a message.  We will attempt to reach you two times.  During this call, we will ask if you have developed any symptoms of COVID 19. If you develop any symptoms (ie: fever, flu-like symptoms, shortness of breath, cough etc.) before then, please call [phone].  If you test positive for Covid 19 in the 2 weeks post procedure, please call and report this information to [place].    If any biopsies were taken you will be contacted by phone or by letter within the next 1-3 weeks.  Please call us at [phone] if you have not heard about the biopsies in 3 weeks.    SIGNATURES/CONFIDENTIALITY: You and/or your care partner have signed paperwork which will be entered into your electronic medical record.  These signatures attest to the fact that that the information above on your After Visit Summary has  been reviewed and is understood.  Full responsibility of the confidentiality of this discharge information lies with you and/or your care-partner.

## 2020-07-11 NOTE — Progress Notes (Signed)
To PACU, VSS. Report to [email or web address] 

## 2020-07-11 NOTE — Progress Notes (Signed)
Pt's states no medical or surgical changes since previsit or office visit.  CW - vitals 

## 2020-07-14 ENCOUNTER — Telehealth: Payer: Self-pay | Admitting: *Deleted

## 2020-07-14 NOTE — Telephone Encounter (Signed)
I have faxed records and referral over to [facility] ([place]) at fax [phone]. We will await appointment information.

## 2020-07-14 NOTE — Telephone Encounter (Signed)
-----   Message from Jerene Bears, MD sent at 07/11/2020 10:41 AM EDT ----- Regarding: Liver referral Please refer pt to [facility] liver clinic for cirrhosis with hx of ascites, varices for evaluation of liver transplant. I discussed this with patient today at time of EGD, she is open. I do not think she needs transplantation at this time, but would like her to have this discussion with liver transplant center in the event of further decompensation in the future. She is aware and open to this referral Thanks JMP

## 2020-07-15 ENCOUNTER — Other Ambulatory Visit: Payer: Self-pay | Admitting: Internal Medicine

## 2020-07-15 ENCOUNTER — Telehealth: Payer: Self-pay

## 2020-07-15 NOTE — Telephone Encounter (Signed)
Called #[phone] and left a message we tried to reach pt for a follow up call. maw  °

## 2020-07-18 NOTE — Telephone Encounter (Signed)
Per [facility], patient has been scheduled for appointment on 08/30/20 at 11:00 am.

## 2020-08-30 ENCOUNTER — Other Ambulatory Visit: Payer: Self-pay | Admitting: Nurse Practitioner

## 2020-08-30 DIAGNOSIS — D72829 Elevated white blood cell count, unspecified: Secondary | ICD-10-CM | POA: Diagnosis not present

## 2020-08-30 DIAGNOSIS — R748 Abnormal levels of other serum enzymes: Secondary | ICD-10-CM | POA: Diagnosis not present

## 2020-08-30 DIAGNOSIS — K7460 Unspecified cirrhosis of liver: Secondary | ICD-10-CM

## 2020-08-30 DIAGNOSIS — K766 Portal hypertension: Secondary | ICD-10-CM

## 2020-08-30 DIAGNOSIS — R17 Unspecified jaundice: Secondary | ICD-10-CM | POA: Diagnosis not present

## 2020-09-04 ENCOUNTER — Telehealth: Payer: Self-pay | Admitting: Hematology and Oncology

## 2020-09-04 NOTE — Telephone Encounter (Signed)
I received a new pt referral from [place] for leukocytosis, thrombocytopenia and massive splenomegaly. [name] has been cld and scheduled to see Dr. Chryl Heck on 11/5 at 10am. Pt aware to arrive 15 minutes early.

## 2020-09-05 NOTE — Progress Notes (Signed)
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 She saw Dr Alen Blew at that time. She developed symptoms of abdominal pain and ascites and there was a question of cirrhosis. 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  Her workup at that time did not really reveal any specific liver disease. She had hypercoagulable work up which showed JAK 2 mutation, approximately 40 % allele burden. She was recommended BMB at that time but was lost to FU. She was following up with hepatology in [place], but I dont see any recent note from [facility] as well. She apparently stopped seeing team at [facility] because they stopped coming to [place]. She was re-referred to hematology for additional recommendations given history of JAK2 mutation and consideration for liver transplant. She is doing well today. She has never required paracentesis except for twice in the past 10 yrs, when she initially presented and when she first saw the hepatologist. She denies any other clotting events except for the history of PVT. She had 2 pregnancies, no miscarriages and no peripartum or postpartum VTE FH significant for some clotting in sister post partum, a possible stroke and DVT. Mom had varicose veins but no definitive evidence of VTE She denies any significant alcohol intake or Hep C. No B symptoms otherwise No erythromelalgia or intractable pruritus. She complains of some left upper abdominal pain, easy satiety.' She is a retired Pharmacist, hospital, retired about 7 yrs ago,  Rest of the pertinent 10  point ROS reviewed and negative as mentioned below  MEDICAL HISTORY:  Past Medical History:  Diagnosis Date  . Cirrhosis ([place])   . Colon polyps   . Depression   . Esophageal varices ([place])   . Gastritis   . Internal hemorrhoids   . Migraine   . Portal hypertensive gastropathy ([place])   . PVT (portal vein thrombosis)     SURGICAL HISTORY: Past Surgical History:  Procedure Laterality Date  . LIVER BIOPSY      SOCIAL HISTORY: Social History   Socioeconomic History  . Marital status: Divorced    Spouse name: Not on file  . Number of children: Not on file  . Years of education: Not on file  . Highest education level: Not on file  Occupational History  . Not on file  Tobacco Use  . Smoking status: Former Smoker    Types: Cigarettes  . Smokeless tobacco: Never Used  Vaping Use  . Vaping Use: Never used  Substance and Sexual Activity  . Alcohol use: Yes    Comment: Occasionally-1 acoholic beverage every 1-2 weeks  . Drug use: No  . Sexual activity: Not on file  Other Topics Concern  . Not on file  Social History Narrative  . Not on file   Social Determinants of Health   Financial Resource Strain:   . Difficulty of Paying Living Expenses: Not on file  Food Insecurity:   . Worried About Charity fundraiser in the Last Year: Not on file  . Ran Out of Food in the Last Year: Not on file  Transportation Needs:   .  Lack of Transportation (Medical): Not on file  . Lack of Transportation (Non-Medical): Not on file  Physical Activity:   . Days of Exercise per Week: Not on file  . Minutes of Exercise per Session: Not on file  Stress:   . Feeling of Stress : Not on file  Social Connections:   . Frequency of Communication with Friends and Family: Not on file  . Frequency of Social Gatherings with Friends and Family: Not on file  . Attends Religious Services: Not on file  . Active Member of Clubs or Organizations: Not on file  . Attends Archivist Meetings: Not  on file  . Marital Status: Not on file  Intimate Partner Violence:   . Fear of Current or Ex-Partner: Not on file  . Emotionally Abused: Not on file  . Physically Abused: Not on file  . Sexually Abused: Not on file    FAMILY HISTORY: Family History  Problem Relation Age of Onset  . Cancer - Lung Father   . Diabetes Mellitus II Maternal Aunt   . Diabetes Mellitus II Maternal Uncle   . Stomach cancer Maternal Aunt   . Colon cancer Neg Hx   . Rectal cancer Neg Hx   . Esophageal cancer Neg Hx   . Liver disease Neg Hx   . Pancreatic cancer Neg Hx     ALLERGIES:  has No Known Allergies.  MEDICATIONS:  Current Outpatient Medications  Medication Sig Dispense Refill  . propranolol (INDERAL) 10 MG tablet Take 10 mg by mouth daily.     No current facility-administered medications for this visit.    REVIEW OF SYSTEMS:   Constitutional: Denies fevers, chills or abnormal night sweats. Some fatigue and loss of appetite. Eyes: Denies blurriness of vision, double vision or watery eyes Ears, nose, mouth, throat, and face: Denies mucositis or sore throat Respiratory: Denies cough, dyspnea or wheezes Cardiovascular: Denies palpitation, chest discomfort or lower extremity swelling Gastrointestinal:  Denies nausea, heartburn or change in bowel habits Skin: Denies abnormal skin rashes Lymphatics: Denies new lymphadenopathy or easy bruising Neurological:Denies numbness, tingling or new weaknesses Behavioral/Psych: Mood is stable, no new changes  All other systems were reviewed with the patient and are negative.  PHYSICAL EXAMINATION: ECOG PERFORMANCE STATUS: 0 - Asymptomatic  Vitals:   09/06/20 1035  BP: 115/66  Pulse: 76  Resp: 18  Temp: 98.2 F (36.8 C)  SpO2: 100%   Filed Weights   09/06/20 1035  Weight: 168 lb 11.2 oz (76.5 kg)    GENERAL:alert, no distress and comfortable SKIN: skin color, texture, turgor are normal, no rashes or significant lesions EYES: normal,  conjunctiva are pink and non-injected, sclera clear OROPHARYNX:no exudate, no erythema and lips, buccal mucosa, and tongue normal  NECK: supple, thyroid normal size, non-tender, without nodularity LYMPH:  no palpable lymphadenopathy in the cervical, axillary or inguinal LUNGS: clear to auscultation and percussion with normal breathing effort HEART: regular rate & rhythm and no murmurs and no lower extremity edema ABDOMEN: Splenomegaly moderate, no ascites noted today Musculoskeletal:no cyanosis of digits and no clubbing  PSYCH: alert & oriented x 3 with fluent speech NEURO: no focal motor/sensory deficits  LABORATORY DATA:  I have reviewed the data as listed Lab Results  Component Value Date   WBC 14.8 (H) 05/31/2020   HGB 14.1 05/31/2020   HCT 42.7 05/31/2020   MCV 76.3 (L) 05/31/2020   PLT 476.0 (H) 05/31/2020     Chemistry      Component Value  Date/Time   NA 142 05/31/2020 1554   K 4.3 05/31/2020 1554   CL 110 05/31/2020 1554   CO2 28 05/31/2020 1554   BUN 16 05/31/2020 1554   CREATININE 0.65 05/31/2020 1554      Component Value Date/Time   CALCIUM 8.8 05/31/2020 1554   ALKPHOS 118 (H) 05/31/2020 1554   AST 38 (H) 05/31/2020 1554   ALT 19 05/31/2020 1554   BILITOT 3.8 (H) 05/31/2020 1554     JAK2 V617F MUTATION ANALYSIS: POSITIVE FOR JAK2 V617F MUTATION RESULTS The level of JAK2 mosaicism in this sample is 38.40%.  RADIOGRAPHIC STUDIES: I have personally reviewed the radiological images as listed and agreed with the findings in the report. No results found.  ASSESSMENT & PLAN:   This is a 69 yr old female patient with PMH significant for PVT diagnosed in 2011, had hypercoagulable work up at that time who was referred to hematology for discussion. She met my colleague Dr Alen Blew many years ago and he recommended a BMB but it looks like this hasnt been done.  She is now following up with hematology and is being considered for liver transplant hence re-referred back  to hematology. She denies any new B symptoms, erythromelalgia and pruritus. She does have a dragging discomfort in left abdomen and early satiety likely from splenomegaly. She had varices according to her but not sizable enough to be banded. PE remarkable for some pallor, splenomegaly, no remarkable ascites or concerns for imminent liver failure I have reviewed her medical records from 2011.  She was diagnosed with PVT and has a reported JAK 2 V 5 F mutation with a significant allele burden, hence I do agree with BMB to better define presence of MPN and I have also strongly recommended considering long term anticoagulation if MPN is defined on BM since this will be categorized as high risk with her history. Most recent endoscopy showed small varices.  She understands high risk of bleeding with anticoagulation on board. I have also discussed that cytoreductive agents like Hydrea may also be considered down the lane if her blood counts worsen. She says she is up to date with age appropriate cancer screening RTC in 4 weeks and discuss further. BMB will be scheduled in next 2/3 weeks.  No problem-specific Assessment & Plan notes found for this encounter.  No orders of the defined types were placed in this encounter.  All questions were answered. The patient knows to call the clinic with any problems, questions or concerns. I spent 60 minutes counseling the patient face to face. The total time spent in the appointment was 40 minutes and more than 50% was on counseling.     Benay Pike, MD 09/06/2020 11:47 AM

## 2020-09-06 ENCOUNTER — Inpatient Hospital Stay: Payer: Medicare PPO | Attending: Hematology and Oncology | Admitting: Hematology and Oncology

## 2020-09-06 ENCOUNTER — Other Ambulatory Visit: Payer: Self-pay

## 2020-09-06 VITALS — BP 115/66 | HR 76 | Temp 98.2°F | Resp 18 | Ht 66.0 in | Wt 168.7 lb

## 2020-09-06 DIAGNOSIS — D75839 Thrombocytosis, unspecified: Secondary | ICD-10-CM

## 2020-09-06 DIAGNOSIS — Z79899 Other long term (current) drug therapy: Secondary | ICD-10-CM | POA: Diagnosis not present

## 2020-09-06 DIAGNOSIS — I81 Portal vein thrombosis: Secondary | ICD-10-CM | POA: Diagnosis not present

## 2020-09-06 DIAGNOSIS — I472 Ventricular tachycardia: Secondary | ICD-10-CM | POA: Insufficient documentation

## 2020-09-06 DIAGNOSIS — Z87891 Personal history of nicotine dependence: Secondary | ICD-10-CM | POA: Diagnosis not present

## 2020-09-09 ENCOUNTER — Telehealth: Payer: Self-pay

## 2020-09-09 ENCOUNTER — Other Ambulatory Visit: Payer: Self-pay | Admitting: *Deleted

## 2020-09-09 NOTE — Telephone Encounter (Signed)
Attempted to call pt to set up for BMBX. Pt did not answer. LVM for pt to return call to [phone] and request to speak with [facility] nurse.

## 2020-09-09 NOTE — Telephone Encounter (Signed)
This LPN spoke with pt in regards to the need for a BMBX. Pt is in understanding of this, and agreed to an appt for 09/13/20 at 0730. Pt understands to arrive for check in at 0715. Flowtometry is aware, and schedule message sent to put pt on schedule for BMBX and labs to follow.

## 2020-09-13 ENCOUNTER — Other Ambulatory Visit: Payer: Self-pay

## 2020-09-13 ENCOUNTER — Inpatient Hospital Stay (HOSPITAL_BASED_OUTPATIENT_CLINIC_OR_DEPARTMENT_OTHER): Payer: Medicare PPO | Admitting: Adult Health

## 2020-09-13 ENCOUNTER — Inpatient Hospital Stay: Payer: Medicare PPO

## 2020-09-13 VITALS — BP 110/66 | HR 68 | Temp 98.6°F | Resp 18

## 2020-09-13 DIAGNOSIS — Z87891 Personal history of nicotine dependence: Secondary | ICD-10-CM | POA: Diagnosis not present

## 2020-09-13 DIAGNOSIS — D72824 Basophilia: Secondary | ICD-10-CM | POA: Diagnosis not present

## 2020-09-13 DIAGNOSIS — D75839 Thrombocytosis, unspecified: Secondary | ICD-10-CM | POA: Diagnosis not present

## 2020-09-13 DIAGNOSIS — D4989 Neoplasm of unspecified behavior of other specified sites: Secondary | ICD-10-CM | POA: Diagnosis not present

## 2020-09-13 DIAGNOSIS — I472 Ventricular tachycardia: Secondary | ICD-10-CM | POA: Diagnosis not present

## 2020-09-13 DIAGNOSIS — I81 Portal vein thrombosis: Secondary | ICD-10-CM | POA: Diagnosis not present

## 2020-09-13 DIAGNOSIS — Z79899 Other long term (current) drug therapy: Secondary | ICD-10-CM | POA: Diagnosis not present

## 2020-09-13 LAB — CMP (CANCER CENTER ONLY)
ALT: 15 U/L (ref 0–44)
AST: 34 U/L (ref 15–41)
Albumin: 2.9 g/dL — ABNORMAL LOW (ref 3.5–5.0)
Alkaline Phosphatase: 117 U/L (ref 38–126)
Anion gap: 5 (ref 5–15)
BUN: 10 mg/dL (ref 8–23)
CO2: 25 mmol/L (ref 22–32)
Calcium: 8.1 mg/dL — ABNORMAL LOW (ref 8.9–10.3)
Chloride: 112 mmol/L — ABNORMAL HIGH (ref 98–111)
Creatinine: 0.61 mg/dL (ref 0.44–1.00)
GFR, Estimated: >60 mL/min (ref >60)
Glucose, Bld: 110 mg/dL — ABNORMAL HIGH (ref 70–99)
Potassium: 3.6 mmol/L (ref 3.5–5.1)
Sodium: 142 mmol/L (ref 135–145)
Total Bilirubin: 3.3 mg/dL — ABNORMAL HIGH (ref 0.3–1.2)
Total Protein: 4.6 g/dL — ABNORMAL LOW (ref 6.5–8.1)

## 2020-09-13 LAB — CBC WITH DIFFERENTIAL/PLATELET
Abs Immature Granulocytes: 0.19 10*3/uL — ABNORMAL HIGH (ref 0.00–0.07)
Basophils Absolute: 0.3 10*3/uL — ABNORMAL HIGH (ref 0.0–0.1)
Basophils Relative: 3 %
Eosinophils Absolute: 0.3 10*3/uL (ref 0.0–0.5)
Eosinophils Relative: 4 %
HCT: 37.7 % (ref 36.0–46.0)
Hemoglobin: 12.2 g/dL (ref 12.0–15.0)
Immature Granulocytes: 2 %
Lymphocytes Relative: 10 %
Lymphs Abs: 0.9 10*3/uL (ref 0.7–4.0)
MCH: 27.2 pg (ref 26.0–34.0)
MCHC: 32.4 g/dL (ref 30.0–36.0)
MCV: 84.2 fL (ref 80.0–100.0)
Monocytes Absolute: 0.4 10*3/uL (ref 0.1–1.0)
Monocytes Relative: 4 %
Neutro Abs: 7.3 10*3/uL (ref 1.7–7.7)
Neutrophils Relative %: 77 %
Platelets: 301 10*3/uL (ref 150–400)
RBC: 4.48 MIL/uL (ref 3.87–5.11)
RDW: 23.2 % — ABNORMAL HIGH (ref 11.5–15.5)
WBC: 9.4 10*3/uL (ref 4.0–10.5)
nRBC: 0.3 % — ABNORMAL HIGH (ref 0.0–0.2)

## 2020-09-13 MED ORDER — LIDOCAINE HCL 2 % IJ SOLN
INTRAMUSCULAR | Status: AC
  Filled 2020-09-13: qty 20

## 2020-09-13 NOTE — Progress Notes (Signed)
INDICATION: thrombocytosis  Brief examination was performed. ENT: adequate airway clearance Heart: regular rate and rhythm.No Murmurs Lungs: clear to auscultation, no wheezes, normal respiratory effort  Bone Marrow Biopsy and Aspiration Procedure Note   Informed consent was obtained and potential risks including bleeding, infection and pain were reviewed with the patient.  The patient's name, date of birth, identification, consent and allergies were verified prior to the start of procedure and time out was performed.  The left posterior iliac crest was chosen as the site of biopsy.  The skin was prepped with ChloraPrep.   8 cc of 2% lidocaine was used to provide local anaesthesia.   10 cc of bone marrow aspirate was obtained followed by 1cm biopsy.  Pressure was applied to the biopsy site and bandage was placed over the biopsy site. Patient was made to lie on the back for 30 mins prior to discharge.  The procedure was tolerated moderately well.  She was very anxious and became nauseated and hot during the procedure.   COMPLICATIONS: None BLOOD LOSS: none The patient was discharged home in stable condition with a 3 week follow up to review results.  Patient was provided with post bone marrow biopsy instructions and instructed to call if there was any bleeding or worsening pain.  Specimens sent for flow cytometry, cytogenetics and additional studies.  Signed Scot Dock, NP

## 2020-09-13 NOTE — Patient Instructions (Signed)
Bone Marrow Aspiration and Bone Marrow Biopsy, Adult, Care After This sheet gives you information about how to care for yourself after your procedure. Your health care provider may also give you more specific instructions. If you have problems or questions, contact your health care provider. What can I expect after the procedure? After the procedure, it is common to have:  Mild pain and tenderness.  Swelling.  Bruising. Follow these instructions at home: Puncture site care   Follow instructions from your health care provider about how to take care of the puncture site. Make sure you: ? Wash your hands with soap and water before and after you change your bandage (dressing). If soap and water are not available, use hand sanitizer. ? Change your dressing as told by your health care provider.  Check your puncture site every day for signs of infection. Check for: ? More redness, swelling, or pain. ? Fluid or blood. ? Warmth. ? Pus or a bad smell. Activity  Return to your normal activities as told by your health care provider. Ask your health care provider what activities are safe for you.  Do not lift anything that is heavier than 10 lb (4.5 kg), or the limit that you are told, until your health care provider says that it is safe.  Do not drive for 24 hours if you were given a sedative during your procedure. General instructions   Take over-the-counter and prescription medicines only as told by your health care provider.  Do not take baths, swim, or use a hot tub until your health care provider approves. Ask your health care provider if you may take showers. You may only be allowed to take sponge baths.  If directed, put ice on the affected area. To do this: ? Put ice in a plastic bag. ? Place a towel between your skin and the bag. ? Leave the ice on for 20 minutes, 2-3 times a day.  Keep all follow-up visits as told by your health care provider. This is important. Contact a  health care provider if:  Your pain is not controlled with medicine.  You have a fever.  You have more redness, swelling, or pain around the puncture site.  You have fluid or blood coming from the puncture site.  Your puncture site feels warm to the touch.  You have pus or a bad smell coming from the puncture site. Summary  After the procedure, it is common to have mild pain, tenderness, swelling, and bruising.  Follow instructions from your health care provider about how to take care of the puncture site and what activities are safe for you.  Take over-the-counter and prescription medicines only as told by your health care provider.  Contact a health care provider if you have any signs of infection, such as fluid or blood coming from the puncture site. This information is not intended to replace advice given to you by your health care provider. Make sure you discuss any questions you have with your health care provider. Document Revised: 03/07/2019 Document Reviewed: 03/07/2019 Elsevier Patient Education  2020 [employer]  

## 2020-09-13 NOTE — Progress Notes (Signed)
Pt remained for 30 min post procedure wait period.  Tolerated well.  VS stable.  Labs drawn per order.  Pt ambulatory to exit with belongings, steady gait.

## 2020-09-17 ENCOUNTER — Telehealth: Payer: Self-pay | Admitting: Adult Health

## 2020-09-17 LAB — SURGICAL PATHOLOGY

## 2020-09-17 NOTE — Telephone Encounter (Signed)
No 11/12 los, no changes made to pt schedule

## 2020-09-23 ENCOUNTER — Encounter (HOSPITAL_COMMUNITY): Payer: Self-pay

## 2020-09-24 ENCOUNTER — Telehealth: Payer: Self-pay | Admitting: *Deleted

## 2020-10-03 ENCOUNTER — Other Ambulatory Visit: Payer: Self-pay

## 2020-10-03 ENCOUNTER — Ambulatory Visit
Admission: RE | Admit: 2020-10-03 | Discharge: 2020-10-03 | Disposition: A | Discharge status: Home / Self Care | Payer: Medicare PPO | Source: Ambulatory Visit | Attending: Nurse Practitioner | Admitting: Nurse Practitioner

## 2020-10-03 DIAGNOSIS — R748 Abnormal levels of other serum enzymes: Secondary | ICD-10-CM

## 2020-10-03 DIAGNOSIS — K766 Portal hypertension: Secondary | ICD-10-CM

## 2020-10-03 DIAGNOSIS — D1809 Hemangioma of other sites: Secondary | ICD-10-CM | POA: Diagnosis not present

## 2020-10-03 DIAGNOSIS — K7460 Unspecified cirrhosis of liver: Secondary | ICD-10-CM

## 2020-10-03 DIAGNOSIS — R188 Other ascites: Secondary | ICD-10-CM | POA: Diagnosis not present

## 2020-10-03 MED ORDER — GADOXETATE DISODIUM 0.25 MMOL/ML IV SOLN
8.0000 mL | Freq: Once | INTRAVENOUS | Status: AC | PRN
  Administered 2020-10-03: 8 mL via INTRAVENOUS

## 2020-10-03 NOTE — Progress Notes (Signed)
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 She saw Dr Alen Blew at that time. She developed symptoms of abdominal pain and ascites and there was a question of cirrhosis. 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  Her workup at that time did not really reveal any specific liver disease. She had hypercoagulable work up which showed JAK 2 mutation, approximately 40 % allele burden. She was recommended BMB at that time but was lost to FU. She was following up with hepatology in [place], but I dont see any recent note from [facility] as well. She apparently stopped seeing team at [facility] because they stopped coming to [place]. She was re-referred to hematology for additional recommendations given history of JAK2 mutation and consideration for liver transplant. She denies any other clotting events except for the history of PVT. She had 2 pregnancies, no miscarriages and no peripartum or postpartum VTE FH significant for some clotting in sister post partum, a possible stroke and DVT. Mom had varicose veins but no definitive evidence of VTE She denies any significant alcohol intake or Hep C. No B symptoms otherwise No erythromelalgia or intractable pruritus. She complains of some left upper abdominal pain, easy satiety. She is a retired Pharmacist, hospital, retired about 7 yrs ago  During our last visit we recommended a BMB to better define the nature of MPN. 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 It is unknown if this is  primary or secondary fibrosis which happened in the MPN setting.  Interim History  She complains of some ongoing nagging left flank pain, fatigue and early satiety She was hoping we can do something for large spleen. Otherwise no new symptoms.  Rest of the pertinent 10 point ROS reviewed and negative as mentioned below  MEDICAL HISTORY:  Past Medical History:  Diagnosis Date  . Cirrhosis ([place])   . Colon polyps   . Depression   . Esophageal varices ([place])   . Gastritis   . Internal hemorrhoids   . Migraine   . Portal hypertensive gastropathy ([place])   . PVT (portal vein thrombosis)     SURGICAL HISTORY: Past Surgical History:  Procedure Laterality Date  . LIVER BIOPSY      SOCIAL HISTORY: Social History   Socioeconomic History  . Marital status: Divorced    Spouse name: Not on file  . Number of children: Not on file  . Years of education: Not on file  . Highest education level: Not on file  Occupational History  . Not on file  Tobacco Use  . Smoking status: Former Smoker    Types: Cigarettes  . Smokeless tobacco: Never Used  Vaping Use  . Vaping Use: Never used  Substance and Sexual Activity  . Alcohol use: Yes    Comment: Occasionally-1 acoholic beverage every 1-2 weeks  . Drug use: No  . Sexual activity: Not on file  Other Topics Concern  . Not on file  Social History Narrative  . Not on file   Social Determinants of Health   Financial Resource Strain:   .  Difficulty of Paying Living Expenses: Not on file  Food Insecurity:   . Worried About Charity fundraiser in the Last Year: Not on file  . Ran Out of Food in the Last Year: Not on file  Transportation Needs:   . Lack of Transportation (Medical): Not on file  . Lack of Transportation (Non-Medical): Not on file  Physical Activity:   . Days of Exercise per Week: Not on file  . Minutes of Exercise per Session: Not on file  Stress:   . Feeling of Stress : Not on file  Social Connections:   .  Frequency of Communication with Friends and Family: Not on file  . Frequency of Social Gatherings with Friends and Family: Not on file  . Attends Religious Services: Not on file  . Active Member of Clubs or Organizations: Not on file  . Attends Archivist Meetings: Not on file  . Marital Status: Not on file  Intimate Partner Violence:   . Fear of Current or Ex-Partner: Not on file  . Emotionally Abused: Not on file  . Physically Abused: Not on file  . Sexually Abused: Not on file    FAMILY HISTORY: Family History  Problem Relation Age of Onset  . Cancer - Lung Father   . Diabetes Mellitus II Maternal Aunt   . Diabetes Mellitus II Maternal Uncle   . Stomach cancer Maternal Aunt   . Colon cancer Neg Hx   . Rectal cancer Neg Hx   . Esophageal cancer Neg Hx   . Liver disease Neg Hx   . Pancreatic cancer Neg Hx     ALLERGIES:  has No Known Allergies.  MEDICATIONS:  No current outpatient medications on file.   No current facility-administered medications for this visit.    REVIEW OF SYSTEMS:   Constitutional: Denies fevers, chills or abnormal night sweats. Some fatigue and loss of appetite. Eyes: Denies blurriness of vision, double vision or watery eyes Ears, nose, mouth, throat, and face: Denies mucositis or sore throat Respiratory: Denies cough, dyspnea or wheezes Cardiovascular: Denies palpitation, chest discomfort or lower extremity swelling Gastrointestinal:  Left flank pain, early satiety Skin: Denies abnormal skin rashes Lymphatics: Denies new lymphadenopathy or easy bruising Neurological:Denies numbness, tingling or new weaknesses Behavioral/Psych: Mood is stable, no new changes  All other systems were reviewed with the patient and are negative.  PHYSICAL EXAMINATION: ECOG PERFORMANCE STATUS: 0 - Asymptomatic  There were no vitals filed for this visit. There were no vitals filed for this visit.  GENERAL:alert, no distress and comfortable SKIN: skin  color, texture, turgor are normal, no rashes or significant lesions. She appears pale. EYES: normal, conjunctiva are pink and non-injected, sclera clear OROPHARYNX:no exudate, no erythema and lips, buccal mucosa, and tongue normal  NECK: supple, thyroid normal size, non-tender, without nodularity LYMPH:  no palpable lymphadenopathy in the cervical, axillary or inguinal LUNGS: clear to auscultation and percussion with normal breathing effort HEART: regular rate & rhythm and no murmurs and no lower extremity edema ABDOMEN: Splenomegaly measuring about 23 cms on exam,  no ascites noted today Musculoskeletal:no cyanosis of digits and no clubbing  PSYCH: alert & oriented x 3 with fluent speech NEURO: no focal motor/sensory deficits  LABORATORY DATA:  I have reviewed the data as listed Lab Results  Component Value Date   WBC 9.4 09/13/2020   HGB 12.2 09/13/2020   HCT 37.7 09/13/2020   MCV 84.2 09/13/2020   PLT 301 09/13/2020     Chemistry  Component Value Date/Time   NA 142 09/13/2020 0829   K 3.6 09/13/2020 0829   CL 112 (H) 09/13/2020 0829   CO2 25 09/13/2020 0829   BUN 10 09/13/2020 0829   CREATININE 0.61 09/13/2020 0829      Component Value Date/Time   CALCIUM 8.1 (L) 09/13/2020 0829   ALKPHOS 117 09/13/2020 0829   AST 34 09/13/2020 0829   ALT 15 09/13/2020 0829   BILITOT 3.3 (H) 09/13/2020 0829     JAK2 V617F MUTATION ANALYSIS: POSITIVE FOR JAK2 V617F MUTATION RESULTS The level of JAK2 mosaicism in this sample is 38.40%.  RADIOGRAPHIC STUDIES: I have personally reviewed the radiological images as listed and agreed with the findings in the report. MR ABDOMEN MRCP W WO CONTAST  Result Date: 10/03/2020 CLINICAL DATA:  Cirrhosis with ascites. EXAM: MRI ABDOMEN WITHOUT AND WITH CONTRAST (INCLUDING MRCP) TECHNIQUE: Multiplanar multisequence MR imaging of the abdomen was performed both before and after the administration of intravenous contrast. Heavily T2-weighted images  of the biliary and pancreatic ducts were obtained, and three-dimensional MRCP images were rendered by post processing. CONTRAST:  2m EOVIST GADOXETATE DISODIUM 0.25 MOL/L IV SOLN COMPARISON:  12/31/2018 FINDINGS: Lower chest: Unremarkable. Hepatobiliary: No arterial phase hyperenhancement in the liver. The 11 mm cavernous hemangioma in the posterior right liver is stable. Gallbladder is decompressed with probable wall thickening. Wall thickening likely related to underlying hepatic disease. No intrahepatic or extrahepatic biliary dilation. Pancreas: No focal mass lesion. No dilatation of the main duct. No intraparenchymal cyst. No peripancreatic edema. Spleen:  Stable splenomegaly. Adrenals/Urinary Tract: Right adrenal gland unremarkable. Left adrenal gland not well seen. Right kidney unremarkable. Left kidney is displaced inferiorly due to splenomegaly and has been incompletely characterized on today's study. Stomach/Bowel: Stomach is decompressed. Duodenum is normally positioned as is the ligament of Treitz. No small bowel or colonic dilatation within the visualized abdomen. Vascular/Lymphatic: No abdominal aortic aneurysm. Chronic occlusion of the portal vein again noted with extensive venous collateralization in the left abdomen. Other:  Moderate volume ascites with mesenteric edema. Musculoskeletal: No focal suspicious marrow enhancement within the visualized bony anatomy. IMPRESSION: 1. Stable 11 mm cavernous hemangioma in the posterior right liver. No arterial phase hyperenhancement in the liver to suggest hepatocellular carcinoma. 2. Chronic portal vein occlusion with stable splenomegaly and extensive venous collateralization in the left abdomen. Imaging features consistent with portal venous hypertension in this patient with clinical history of cirrhosis. 3. Moderate volume ascites with mesenteric edema. Electronically Signed   By: EMisty StanleyM.D.   On: 10/03/2020 12:30    ASSESSMENT & PLAN:   This  is a 69yr old female patient with PMH significant for PVT diagnosed in 2011, had hypercoagulable work up at that time who was referred to hematology for discussion. She met my colleague Dr SAlen Blewmany years ago and he recommended a BMB but it looks like this hasnt been done.  She is now following up with hematology and is being considered for liver transplant hence re-referred back to hematology. She denies any new B symptoms, erythromelalgia and pruritus. She does have a dragging discomfort in left abdomen and early satiety likely from splenomegaly. She had varices according to her but not sizable enough to be banded. PE remarkable for some pallor, splenomegaly, no remarkable ascites or concerns for imminent liver failure I have reviewed her medical records from 2011.  She was diagnosed with PVT and has a reported JAK 2 V 696F mutation with a significant allele burden,  hence we proceeded with BMB. BMB confirmed myelofibrosis, risk stratification pending, HMR molecular testing pending.Cytogenetics normal female karyotype. We don't know if this is primary vs secondary myelofibrosis which evolved from ET. Given massive splenomegaly and being symptomatic from it, despite the fact that some of this splenomegaly could be related to cirrhosis vs myelofibrosis, we could attempt Jakafi. She may not be a BMT candidate for myelofibrosis despite risk given diagnosis of cirrhosis. We will send a referral to [facility] for any additional recommendations. In the interim, I will also try to send a prescription for [facility] which was effective at reducing splenomegaly based on data from COMFORT 1 and COMFORT 2 study. It does not prolong survival in PMF according to data so far. Discussed common side effects which include fatigue, cytopenias, lab abnormalities, diarrhea and infections. She was instructed to not start [place] until after she follows up with [facility] team, to make sure no clinical trial is appropriate  for her. Discussed the need for monthly monitoring on this drug and FU clinic visits monthly  No problem-specific Assessment & Plan notes found for this encounter.  No orders of the defined types were placed in this encounter.  All questions were answered. The patient knows to call the clinic with any problems, questions or concerns. I spent 30 minutes in the care of this patient including H and P, review of medical records, counseling and coordination of care.    Benay Pike, MD 10/03/2020 7:34 PM

## 2020-10-04 ENCOUNTER — Other Ambulatory Visit: Payer: Self-pay | Admitting: Hematology and Oncology

## 2020-10-04 ENCOUNTER — Telehealth: Payer: Self-pay

## 2020-10-04 ENCOUNTER — Inpatient Hospital Stay: Payer: Medicare PPO | Attending: Hematology and Oncology | Admitting: Hematology and Oncology

## 2020-10-04 ENCOUNTER — Encounter: Payer: Self-pay | Admitting: Hematology and Oncology

## 2020-10-04 ENCOUNTER — Inpatient Hospital Stay: Payer: Medicare PPO

## 2020-10-04 ENCOUNTER — Other Ambulatory Visit: Payer: Self-pay

## 2020-10-04 VITALS — BP 119/63 | HR 73 | Temp 97.1°F | Resp 18 | Ht 66.0 in | Wt 173.1 lb

## 2020-10-04 DIAGNOSIS — I81 Portal vein thrombosis: Secondary | ICD-10-CM | POA: Diagnosis not present

## 2020-10-04 DIAGNOSIS — Z87891 Personal history of nicotine dependence: Secondary | ICD-10-CM | POA: Diagnosis not present

## 2020-10-04 DIAGNOSIS — R161 Splenomegaly, not elsewhere classified: Secondary | ICD-10-CM

## 2020-10-04 DIAGNOSIS — R1012 Left upper quadrant pain: Secondary | ICD-10-CM | POA: Diagnosis not present

## 2020-10-04 DIAGNOSIS — D75839 Thrombocytosis, unspecified: Secondary | ICD-10-CM | POA: Insufficient documentation

## 2020-10-04 DIAGNOSIS — Z79899 Other long term (current) drug therapy: Secondary | ICD-10-CM | POA: Diagnosis not present

## 2020-10-04 DIAGNOSIS — R109 Unspecified abdominal pain: Secondary | ICD-10-CM

## 2020-10-04 DIAGNOSIS — K7460 Unspecified cirrhosis of liver: Secondary | ICD-10-CM | POA: Diagnosis not present

## 2020-10-04 DIAGNOSIS — D7581 Myelofibrosis: Secondary | ICD-10-CM | POA: Diagnosis not present

## 2020-10-04 DIAGNOSIS — R6881 Early satiety: Secondary | ICD-10-CM | POA: Diagnosis not present

## 2020-10-04 DIAGNOSIS — K766 Portal hypertension: Secondary | ICD-10-CM | POA: Insufficient documentation

## 2020-10-04 DIAGNOSIS — D751 Secondary polycythemia: Secondary | ICD-10-CM

## 2020-10-04 HISTORY — DX: Myelofibrosis: D75.81

## 2020-10-04 LAB — CBC WITH DIFFERENTIAL/PLATELET
Abs Immature Granulocytes: 0.28 10*3/uL — ABNORMAL HIGH (ref 0.00–0.07)
Basophils Absolute: 0.4 10*3/uL — ABNORMAL HIGH (ref 0.0–0.1)
Basophils Relative: 3 %
Eosinophils Absolute: 0.4 10*3/uL (ref 0.0–0.5)
Eosinophils Relative: 4 %
HCT: 40.9 % (ref 36.0–46.0)
Hemoglobin: 13.2 g/dL (ref 12.0–15.0)
Immature Granulocytes: 2 %
Lymphocytes Relative: 9 %
Lymphs Abs: 1.1 10*3/uL (ref 0.7–4.0)
MCH: 27.4 pg (ref 26.0–34.0)
MCHC: 32.3 g/dL (ref 30.0–36.0)
MCV: 84.9 fL (ref 80.0–100.0)
Monocytes Absolute: 0.4 10*3/uL (ref 0.1–1.0)
Monocytes Relative: 4 %
Neutro Abs: 9.3 10*3/uL — ABNORMAL HIGH (ref 1.7–7.7)
Neutrophils Relative %: 78 %
Platelets: 360 10*3/uL (ref 150–400)
RBC: 4.82 MIL/uL (ref 3.87–5.11)
RDW: 23.2 % — ABNORMAL HIGH (ref 11.5–15.5)
WBC: 11.9 10*3/uL — ABNORMAL HIGH (ref 4.0–10.5)
nRBC: 0.2 % (ref 0.0–0.2)

## 2020-10-04 MED ORDER — RUXOLITINIB PHOSPHATE 20 MG PO TABS: 20.0000 mg | ORAL_TABLET | Freq: Two times a day (BID) | ORAL | 0 refills | Status: DC

## 2020-10-04 NOTE — Telephone Encounter (Signed)
Oral Oncology Patient Advocate Encounter  Prior Authorization for Courtney Buckley has been approved.    PA# [id number] Effective dates: 11/03/19 through 11/01/21  Patients co-pay is $100  Oral Oncology Clinic will continue to follow.   [place] Patient [place] Phone [phone] Fax [phone] 10/04/2020 3:38 PM

## 2020-10-04 NOTE — Telephone Encounter (Signed)
Oral Oncology Patient Advocate Encounter  Received notification from [facility] that prior authorization for Courtney Buckley is required.  PA submitted on CoverMyMeds Key [id number] Status is pending  Oral Oncology Clinic will continue to follow.  [place] Patient [place] Phone [phone] Fax [phone] 10/04/2020 11:55 AM

## 2020-10-07 ENCOUNTER — Telehealth: Payer: Self-pay

## 2020-10-07 ENCOUNTER — Telehealth: Payer: Self-pay | Admitting: Pharmacist

## 2020-10-07 NOTE — Telephone Encounter (Addendum)
Oral Oncology Pharmacist Encounter  Received new prescription for Jakafi (ruxolitinib) for the treatment of myelofibrosis, JAK2 positive, planned until disease progression or unacceptable drug toxicity.  Prescription dose and frequency assessed for appropriateness. Appropriate for therapy initiation.   CBC w/ Diff from 10/04/20 and CMP from 09/13/20 assessed, labs OK for treatment initiation.  Current medication list in Epic reviewed, no relevant/significant DDIs with Jakafi identified.  Evaluated chart and no patient barriers to medication adherence noted.   Prescription has been e-scribed to the [facility] for benefits analysis and approval.  Oral Oncology Clinic will continue to follow for insurance authorization, copayment issues, initial counseling and start date.  Leron Croak, PharmD, BCPS Hematology/Oncology Clinical Pharmacist [facility] [phone] 10/07/2020 8:58 AM

## 2020-10-07 NOTE — Telephone Encounter (Signed)
Faxed referral to Dr. Jari Sportsman at [facility].  PH: [phone] FX: [phone]  Confirmation fax transmitted received.

## 2020-10-09 MED FILL — JAKAFI 20 MG TABLET: 20 | 30 days supply | Qty: 60 | Fill #0

## 2020-10-09 NOTE — Telephone Encounter (Signed)
Oral Chemotherapy Pharmacist Encounter   Attempted to reach patient to provide update and offer for initial counseling on oral medication: Jakafi (ruxolitinib).   No answer. Left voicemail for patient to call back to discuss details of medication acquisition and initial counseling session.  Leron Croak, PharmD, BCPS Hematology/Oncology Clinical Pharmacist [facility] [phone] 10/09/2020 9:45 AM

## 2020-10-09 NOTE — Telephone Encounter (Signed)
Oral Chemotherapy Pharmacist Encounter  I spoke with patient for overview of: Jakafi (ruxolitinib) for the treatment of myelofibrosis, JAK2 positive, planned duration until disease progression or unacceptable toxicity.   Counseled patient on administration, dosing, side effects, monitoring, drug-food interactions, safe handling, storage, and disposal.  Patient will take Jakafi 20mg  tablets, 1 tablet by mouth 2 times daily, with or without food.  Patient knows to avoid grapefruit and grapefruit juice.  Jakafi start date: 10/11/20  Adverse effects include but are not limited to: decreased blood counts, increased lipid profile, increased cholesterol, increased liver enzymes, dizziness, headache, diarrhea, increased risk of some infections (including bacterial, mycobacterial, fungal, or viral), and increased risk of non-melanoma skin cancers.    Reviewed with patient importance of keeping a medication schedule and plan for any missed doses. No barriers to medication adherence identified.  Medication reconciliation performed and medication/allergy list updated.  Insurance authorization for [name] has been obtained. Test claim at the pharmacy revealed copayment $100 for 1st fill of Jakafi. Patient will pick up [name] from [place] on 10/10/20.  Patient informed the pharmacy will reach out 5-7 days prior to needing next fill of jakafi to coordinate continued medication acquisition to prevent break in therapy.  All questions answered.  [name] voiced understanding and appreciation.   Medication education handout placed in mail for patient. Patient knows to call the office with questions or concerns. Oral Chemotherapy Clinic phone number provided to patient.   Leron Croak, PharmD, BCPS Hematology/Oncology Clinical Pharmacist [facility] [phone] 10/09/2020 9:56 AM

## 2020-10-22 ENCOUNTER — Encounter (HOSPITAL_COMMUNITY): Payer: Self-pay | Admitting: Hematology and Oncology

## 2020-11-05 DIAGNOSIS — Z7682 Awaiting organ transplant status: Secondary | ICD-10-CM | POA: Diagnosis not present

## 2020-11-05 DIAGNOSIS — K7469 Other cirrhosis of liver: Secondary | ICD-10-CM | POA: Diagnosis not present

## 2020-11-05 DIAGNOSIS — D7581 Myelofibrosis: Secondary | ICD-10-CM | POA: Diagnosis not present

## 2020-11-05 DIAGNOSIS — I81 Portal vein thrombosis: Secondary | ICD-10-CM | POA: Diagnosis not present

## 2020-11-05 DIAGNOSIS — K7460 Unspecified cirrhosis of liver: Secondary | ICD-10-CM | POA: Diagnosis not present

## 2020-11-08 ENCOUNTER — Ambulatory Visit: Payer: Medicare PPO | Admitting: Hematology and Oncology

## 2020-11-11 DIAGNOSIS — D7581 Myelofibrosis: Secondary | ICD-10-CM | POA: Diagnosis not present

## 2020-11-14 DIAGNOSIS — R161 Splenomegaly, not elsewhere classified: Secondary | ICD-10-CM | POA: Diagnosis not present

## 2020-11-14 DIAGNOSIS — Z79899 Other long term (current) drug therapy: Secondary | ICD-10-CM | POA: Diagnosis not present

## 2020-11-14 DIAGNOSIS — L299 Pruritus, unspecified: Secondary | ICD-10-CM | POA: Diagnosis not present

## 2020-11-14 DIAGNOSIS — I81 Portal vein thrombosis: Secondary | ICD-10-CM | POA: Diagnosis not present

## 2020-11-14 DIAGNOSIS — K7460 Unspecified cirrhosis of liver: Secondary | ICD-10-CM | POA: Diagnosis not present

## 2020-11-14 DIAGNOSIS — D7581 Myelofibrosis: Secondary | ICD-10-CM | POA: Diagnosis not present

## 2020-11-14 DIAGNOSIS — D471 Chronic myeloproliferative disease: Secondary | ICD-10-CM | POA: Diagnosis not present

## 2020-11-14 DIAGNOSIS — G43909 Migraine, unspecified, not intractable, without status migrainosus: Secondary | ICD-10-CM | POA: Diagnosis not present

## 2020-11-14 DIAGNOSIS — D75838 Other thrombocytosis: Secondary | ICD-10-CM | POA: Diagnosis not present

## 2020-11-15 ENCOUNTER — Ambulatory Visit: Payer: Medicare PPO | Admitting: Hematology and Oncology

## 2020-12-05 ENCOUNTER — Other Ambulatory Visit: Payer: Self-pay | Admitting: Hematology and Oncology

## 2020-12-06 MED FILL — JAKAFI 20 MG TABLET: 20 | 30 days supply | Qty: 60 | Fill #0

## 2020-12-13 DIAGNOSIS — D7581 Myelofibrosis: Secondary | ICD-10-CM | POA: Diagnosis not present

## 2020-12-19 DIAGNOSIS — D471 Chronic myeloproliferative disease: Secondary | ICD-10-CM | POA: Diagnosis not present

## 2020-12-19 DIAGNOSIS — D7581 Myelofibrosis: Secondary | ICD-10-CM | POA: Diagnosis not present

## 2020-12-27 ENCOUNTER — Encounter: Payer: Self-pay | Admitting: Physician Assistant

## 2020-12-27 ENCOUNTER — Other Ambulatory Visit: Payer: Self-pay

## 2020-12-27 ENCOUNTER — Ambulatory Visit: Payer: Medicare PPO | Admitting: Physician Assistant

## 2020-12-27 VITALS — BP 108/68 | HR 76 | Temp 97.3°F | Ht 66.0 in | Wt 168.4 lb

## 2020-12-27 DIAGNOSIS — K7469 Other cirrhosis of liver: Secondary | ICD-10-CM | POA: Diagnosis not present

## 2020-12-27 DIAGNOSIS — R011 Cardiac murmur, unspecified: Secondary | ICD-10-CM

## 2020-12-27 DIAGNOSIS — F5101 Primary insomnia: Secondary | ICD-10-CM | POA: Diagnosis not present

## 2020-12-27 DIAGNOSIS — D7581 Myelofibrosis: Secondary | ICD-10-CM

## 2020-12-27 DIAGNOSIS — F320 Major depressive disorder, single episode, mild: Secondary | ICD-10-CM | POA: Diagnosis not present

## 2020-12-27 MED ORDER — TRAZODONE HCL 50 MG PO TABS: 50.0000 mg | ORAL_TABLET | Freq: Every day | ORAL | 0 refills | Status: DC

## 2020-12-27 NOTE — Progress Notes (Signed)
New Patient Office Visit  Subjective:  Patient ID: Courtney Buckley, female    DOB: 29-Jul-1951  Age: 70 y.o. MRN: [id number]  CC:  Chief Complaint  Patient presents with  . Establish Care  . Myelofibrosis    10/04/2020    HPI Courtney Buckley presents for new patient establishment. 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  Diagnosis of Myelofibrosis 10/04/20 - sees Dr. Joan Buckley. Taking Jakafi 10 mg BID.  Sees Dr. Hilarie Buckley for her cirrhosis and portal vein thrombosis.   Acute concerns: Insomnia - has tried Unisom without relief. Averages about 5 hours per night. Trouble falling asleep. Worse since cancer diagnosis. She would like to try something different.   Past Medical History:  Diagnosis Date  . Cirrhosis ([place])   . Colon polyps   . Depression   . Esophageal varices ([place])   . Gastritis   . Internal hemorrhoids   . Migraine   . Portal hypertensive gastropathy ([place])   . PVT (portal vein thrombosis)     Past Surgical History:  Procedure Laterality Date  . LIVER BIOPSY      Family History  Problem Relation Age of Onset  . Cancer - Lung Father   . Diabetes Mellitus II Maternal Aunt   . Diabetes Mellitus II Maternal Uncle   . Stomach cancer Maternal Aunt   . Colon cancer Neg Hx   . Rectal cancer Neg Hx   . Esophageal cancer Neg Hx   . Liver disease Neg Hx   . Pancreatic cancer Neg Hx     Social History   Socioeconomic History  . Marital status: Divorced    Spouse name: Not on file  . Number of children: Not on file  . Years of education: Not on file  . Highest education level: Not on file  Occupational History  . Not on file  Tobacco Use  . Smoking status: Former Smoker    Types: Cigarettes  . Smokeless tobacco: Never Used  Vaping Use  . Vaping Use: Never used  Substance and Sexual Activity  . Alcohol use: Yes    Comment: Occasionally-1 acoholic beverage every 1-2 weeks  . Drug use: No  . Sexual activity: Not on file  Other Topics Concern  . Not on  file  Social History Narrative  . Not on file   Social Determinants of Health   Financial Resource Strain: Not on file  Food Insecurity: Not on file  Transportation Needs: Not on file  Physical Activity: Not on file  Stress: Not on file  Social Connections: Not on file  Intimate Partner Violence: Not on file    ROS Review of Systems  Constitutional: Negative for activity change, appetite change, fever and unexpected weight change.  HENT: Negative for congestion.   Eyes: Negative for visual disturbance.  Respiratory: Negative for apnea, cough and shortness of breath.   Cardiovascular: Negative for chest pain, palpitations and leg swelling.  Gastrointestinal: Positive for constipation. Negative for abdominal pain, blood in stool and diarrhea.  Endocrine: Negative for polydipsia, polyphagia and polyuria.  Genitourinary: Negative for dysuria and pelvic pain.  Musculoskeletal: Negative for arthralgias.  Skin: Negative for rash.  Neurological: Positive for dizziness (slightly unstead on feet from new medication). Negative for weakness and headaches.  Hematological: Negative for adenopathy. Does not bruise/bleed easily.  Psychiatric/Behavioral: Positive for sleep disturbance. Negative for suicidal ideas. The patient is not nervous/anxious.     Objective:   Today's Vitals: BP 108/68   Pulse 76  Temp (!) 97.3 F (36.3 C) (Temporal)   Ht 5\' 6"  (1.676 m)   Wt 168 lb 6.4 oz (76.4 kg)   SpO2 100%   BMI 27.18 kg/m   Physical Exam Vitals and nursing note reviewed.  Constitutional:      Appearance: Normal appearance. She is normal weight.  HENT:     Head: Normocephalic and atraumatic.  Eyes:     Extraocular Movements: Extraocular movements intact.     Pupils: Pupils are equal, round, and reactive to light.  Cardiovascular:     Rate and Rhythm: Normal rate and regular rhythm.     Heart sounds: Murmur heard.   Systolic murmur is present with a grade of 4/6.   Pulmonary:      Effort: Pulmonary effort is normal.     Breath sounds: Normal breath sounds.  Abdominal:     Palpations: There is hepatomegaly and splenomegaly.  Musculoskeletal:     Right lower leg: No edema.     Left lower leg: No edema.  Skin:    General: Skin is warm and dry.  Neurological:     Mental Status: She is alert.  Psychiatric:        Mood and Affect: Mood and affect normal.     Assessment & Plan:   Problem List Items Addressed This Visit      Other   Myelofibrosis ([place]) - Primary    Other Visit Diagnoses    Primary insomnia       Depression, major, single episode, mild ([place])       Relevant Medications   traZODone (DESYREL) 50 MG tablet   Heart murmur       Relevant Orders   ECHOCARDIOGRAM COMPLETE   Other cirrhosis of liver [facility])          Outpatient Encounter Medications as of 12/27/2020  Medication Sig  . JAKAFI 20 MG tablet TAKE 1 TABLET (20 MG TOTAL) BY MOUTH 2 (TWO) TIMES DAILY.  . Multiple Vitamin (MULTIVITAMIN ADULT PO) Take by mouth.  . traZODone (DESYREL) 50 MG tablet Take 1 tablet (50 mg total) by mouth at bedtime.  . [DISCONTINUED] pantoprazole (PROTONIX) 40 MG tablet Take 40 mg by mouth daily.   No facility-administered encounter medications on file as of 12/27/2020.    Follow-up: Return in about 4 weeks (around 01/24/2021) for recheck on insomnia.   1. Myelofibrosis ([place]) Continue regular f/up with Dr. Joan Buckley. Currently taking Jakafi.  2. Primary insomnia This has been a problem for a while even before her cancer diagnosis.  Unisom over-the-counter is currently not working.  I will send in trazodone for her to try starting at 50 mg every night.  Side effects were discussed with patient.  3. Depression, major, single episode, mild ([place]) This is secondary to the cancer diagnosis.  Denies any suicidal ideation.  Hopefully the trazodone will help her sleep better as well as curb some of the depression.  We will recheck on this in about 4 to 6 weeks.  4. Heart  murmur Significant murmur heard on exam today.  She said this has never been looked into before.  I am going to start with an echocardiogram of her heart.  5. Other cirrhosis of liver (H[facility]) She is going to follow-up with Dr. Hilarie Buckley.  This visit occurred during the SARS-CoV-2 public health emergency.  Safety protocols were in place, including screening questions prior to the visit, additional usage of staff PPE, and extensive cleaning of exam room while observing appropriate  contact time as indicated for disinfecting solutions.    Soniyah Mcglory M Tarynn Garling, PA-C

## 2020-12-27 NOTE — Patient Instructions (Signed)
Great to meet you today!  Please start on the trazodone 50 mg at bedtime for insomnia.  Call if you have any problems with this medication or send a message through my chart. Someone will call to set up the echocardiogram.  I will follow-up with you in about 4 to 6 weeks.      Insomnia Insomnia is a sleep disorder that makes it difficult to fall asleep or stay asleep. Insomnia can cause fatigue, low energy, difficulty concentrating, mood swings, and poor performance at work or school. There are three different ways to classify insomnia:  Difficulty falling asleep.  Difficulty staying asleep.  Waking up too early in the morning. Any type of insomnia can be long-term (chronic) or short-term (acute). Both are common. Short-term insomnia usually lasts for three months or less. Chronic insomnia occurs at least three times a week for longer than three months. What are the causes? Insomnia may be caused by another condition, situation, or substance, such as:  Anxiety.  Certain medicines.  Gastroesophageal reflux disease (GERD) or other gastrointestinal conditions.  Asthma or other breathing conditions.  Restless legs syndrome, sleep apnea, or other sleep disorders.  Chronic pain.  Menopause.  Stroke.  Abuse of alcohol, tobacco, or illegal drugs.  Mental health conditions, such as depression.  Caffeine.  Neurological disorders, such as Alzheimer's disease.  An overactive thyroid (hyperthyroidism). Sometimes, the cause of insomnia may not be known. What increases the risk? Risk factors for insomnia include:  Gender. Women are affected more often than men.  Age. Insomnia is more common as you get older.  Stress.  Lack of exercise.  Irregular work schedule or working night shifts.  Traveling between different time zones.  Certain medical and mental health conditions. What are the signs or symptoms? If you have insomnia, the main symptom is having trouble falling  asleep or having trouble staying asleep. This may lead to other symptoms, such as:  Feeling fatigued or having low energy.  Feeling nervous about going to sleep.  Not feeling rested in the morning.  Having trouble concentrating.  Feeling irritable, anxious, or depressed. How is this diagnosed? This condition may be diagnosed based on:  Your symptoms and medical history. Your health care provider may ask about: ? Your sleep habits. ? Any medical conditions you have. ? Your mental health.  A physical exam. How is this treated? Treatment for insomnia depends on the cause. Treatment may focus on treating an underlying condition that is causing insomnia. Treatment may also include:  Medicines to help you sleep.  Counseling or therapy.  Lifestyle adjustments to help you sleep better. Follow these instructions at home: Eating and drinking  Limit or avoid alcohol, caffeinated beverages, and cigarettes, especially close to bedtime. These can disrupt your sleep.  Do not eat a large meal or eat spicy foods right before bedtime. This can lead to digestive discomfort that can make it hard for you to sleep.   Sleep habits  Keep a sleep diary to help you and your health care provider figure out what could be causing your insomnia. Write down: ? When you sleep. ? When you wake up during the night. ? How well you sleep. ? How rested you feel the next day. ? Any side effects of medicines you are taking. ? What you eat and drink.  Make your bedroom a dark, comfortable place where it is easy to fall asleep. ? Put up shades or blackout curtains to block light from outside. ? Use  a white noise machine to block noise. ? Keep the temperature cool.  Limit screen use before bedtime. This includes: ? Watching TV. ? Using your smartphone, tablet, or computer.  Stick to a routine that includes going to bed and waking up at the same times every day and night. This can help you fall asleep  faster. Consider making a quiet activity, such as reading, part of your nighttime routine.  Try to avoid taking naps during the day so that you sleep better at night.  Get out of bed if you are still awake after 15 minutes of trying to sleep. Keep the lights down, but try reading or doing a quiet activity. When you feel sleepy, go back to bed.   General instructions  Take over-the-counter and prescription medicines only as told by your health care provider.  Exercise regularly, as told by your health care provider. Avoid exercise starting several hours before bedtime.  Use relaxation techniques to manage stress. Ask your health care provider to suggest some techniques that may work well for you. These may include: ? Breathing exercises. ? Routines to release muscle tension. ? Visualizing peaceful scenes.  Make sure that you drive carefully. Avoid driving if you feel very sleepy.  Keep all follow-up visits as told by your health care provider. This is important. Contact a health care provider if:  You are tired throughout the day.  You have trouble in your daily routine due to sleepiness.  You continue to have sleep problems, or your sleep problems get worse. Get help right away if:  You have serious thoughts about hurting yourself or someone else. If you ever feel like you may hurt yourself or others, or have thoughts about taking your own life, get help right away. You can go to your nearest emergency department or call:  Your local emergency services (911 in the [place]  A suicide crisis helpline, such as the [facility] at 570-888-9165. This is open 24 hours a day. Summary  Insomnia is a sleep disorder that makes it difficult to fall asleep or stay asleep.  Insomnia can be long-term (chronic) or short-term (acute).  Treatment for insomnia depends on the cause. Treatment may focus on treating an underlying condition that is causing insomnia.  Keep  a sleep diary to help you and your health care provider figure out what could be causing your insomnia. This information is not intended to replace advice given to you by your health care provider. Make sure you discuss any questions you have with your health care provider. Document Revised: 08/29/2020 Document Reviewed: 08/29/2020 Elsevier Patient Education  2021 [employer].

## 2021-01-01 ENCOUNTER — Encounter: Payer: Self-pay | Admitting: Physician Assistant

## 2021-01-02 DIAGNOSIS — D7581 Myelofibrosis: Secondary | ICD-10-CM | POA: Diagnosis not present

## 2021-01-03 NOTE — Telephone Encounter (Signed)
Called patient and LVM to call back and get scheduled.

## 2021-01-03 NOTE — Telephone Encounter (Signed)
Will you please call patient to schedule virtual video visit or telephone visit about this? Thanks.

## 2021-01-06 ENCOUNTER — Encounter: Payer: Self-pay | Admitting: Physician Assistant

## 2021-01-06 ENCOUNTER — Ambulatory Visit: Payer: Medicare PPO | Admitting: Physician Assistant

## 2021-01-06 ENCOUNTER — Other Ambulatory Visit: Payer: Self-pay

## 2021-01-06 VITALS — BP 110/70 | HR 75 | Temp 98.1°F | Ht 66.0 in | Wt 178.2 lb

## 2021-01-06 DIAGNOSIS — R42 Dizziness and giddiness: Secondary | ICD-10-CM

## 2021-01-06 DIAGNOSIS — D7581 Myelofibrosis: Secondary | ICD-10-CM

## 2021-01-06 DIAGNOSIS — F320 Major depressive disorder, single episode, mild: Secondary | ICD-10-CM

## 2021-01-06 DIAGNOSIS — R110 Nausea: Secondary | ICD-10-CM | POA: Diagnosis not present

## 2021-01-06 DIAGNOSIS — F5101 Primary insomnia: Secondary | ICD-10-CM

## 2021-01-06 MED ORDER — TRAZODONE HCL 100 MG PO TABS: 100.0000 mg | ORAL_TABLET | Freq: Every day | ORAL | 0 refills | Status: DC

## 2021-01-06 NOTE — Patient Instructions (Signed)
Good to see you today. Please keep your appointment f/up as scheduled with me.  We will increase the Trazodone to 100 mg at bedtime. Let me know if this causes any increase in dizziness or other symptoms before then. If that is the case, then I will send in a different medication to try such as Hydroxyzine.  Please let me know what Dr. Joan Mayans says as well.

## 2021-01-06 NOTE — Progress Notes (Signed)
Established Patient Office Visit  Subjective:  Patient ID: Courtney Buckley, female    DOB: 07/23/1951  Age: 70 y.o. MRN: [id number]  CC:  Chief Complaint  Patient presents with  . Medication Problem    HPI Courtney Buckley presents for discussion about her medications.  She tried trazodone 50 mg for insomnia and had messaged me through my chart that she did not feel like it was working and she was having some nausea issues.  Today she tells me though that she had tried 1 and half tablets in the last few nights she has slept really well.  She had previously been taking 2 Unisom at bedtime for years without much benefit and says she wonders if she was having some withdrawal symptoms from stopping the Unisom when she started the trazodone.  She would like to try the trazodone a little bit longer for her insomnia issues.  Patient also tells me today she is having some "equilibrium issues". Feels like she walks at angle sometimes and loses balances if she bends over /changes position.  She is not exactly sure when these changes started happening but feels like it has been over the last few weeks. (She informed me of this dizziness at last visit as well and thought it may have started when she started on Jakafi.)   01/02/21 Results from Dr. Joan Mayans WBC 3.6 Hemoglobin 8.0 Platelets 122   Past Medical History:  Diagnosis Date  . Cirrhosis ([place])   . Colon polyps   . Depression   . Esophageal varices ([place])   . Gastritis   . Internal hemorrhoids   . Migraine   . Portal hypertensive gastropathy ([place])   . PVT (portal vein thrombosis)     Past Surgical History:  Procedure Laterality Date  . LIVER BIOPSY      Family History  Problem Relation Age of Onset  . Cancer - Lung Father   . Diabetes Mellitus II Maternal Aunt   . Diabetes Mellitus II Maternal Uncle   . Stomach cancer Maternal Aunt   . Colon cancer Neg Hx   . Rectal cancer Neg Hx   . Esophageal cancer Neg Hx   . Liver  disease Neg Hx   . Pancreatic cancer Neg Hx     Social History   Socioeconomic History  . Marital status: Legally Separated    Spouse name: Not on file  . Number of children: Not on file  . Years of education: Not on file  . Highest education level: Not on file  Occupational History  . Not on file  Tobacco Use  . Smoking status: Former Smoker    Types: Cigarettes  . Smokeless tobacco: Never Used  Vaping Use  . Vaping Use: Never used  Substance and Sexual Activity  . Alcohol use: Yes    Comment: Occasionally-1 acoholic beverage every 1-2 weeks  . Drug use: No  . Sexual activity: Not on file  Other Topics Concern  . Not on file  Social History Narrative  . Not on file   Social Determinants of Health   Financial Resource Strain: Not on file  Food Insecurity: Not on file  Transportation Needs: Not on file  Physical Activity: Not on file  Stress: Not on file  Social Connections: Not on file  Intimate Partner Violence: Not on file    Outpatient Medications Prior to Visit  Medication Sig Dispense Refill  . JAKAFI 20 MG tablet TAKE 1 TABLET (20 MG TOTAL) BY MOUTH 2 (  TWO) TIMES DAILY. 60 tablet 0  . Multiple Vitamin (MULTIVITAMIN ADULT PO) Take by mouth.    . traZODone (DESYREL) 50 MG tablet Take 1 tablet (50 mg total) by mouth at bedtime. 30 tablet 0   No facility-administered medications prior to visit.    No Known Allergies  ROS Review of Systems  Constitutional: Negative for activity change, appetite change, fever and unexpected weight change.  HENT: Negative for congestion.   Eyes: Negative for visual disturbance.  Respiratory: Negative for apnea, cough and shortness of breath.   Cardiovascular: Negative for chest pain, palpitations and leg swelling.  Gastrointestinal: Positive for constipation. Negative for abdominal pain, blood in stool and diarrhea.  Endocrine: Negative for polydipsia, polyphagia and polyuria.  Genitourinary: Negative for dysuria and pelvic  pain.  Musculoskeletal: Negative for arthralgias.  Skin: Negative for rash.  Neurological: Positive for dizziness (unsteady feeling). Negative for weakness and headaches.  Hematological: Negative for adenopathy. Does not bruise/bleed easily.  Psychiatric/Behavioral: Positive for sleep disturbance. Negative for suicidal ideas. The patient is not nervous/anxious.       Objective:    Physical Exam Vitals and nursing note reviewed.  Constitutional:      Appearance: Normal appearance. She is normal weight.  HENT:     Head: Normocephalic and atraumatic.  Eyes:     Extraocular Movements: Extraocular movements intact.     Pupils: Pupils are equal, round, and reactive to light.  Cardiovascular:     Rate and Rhythm: Normal rate and regular rhythm.     Heart sounds: Murmur heard.   Systolic murmur is present with a grade of 4/6.   Pulmonary:     Effort: Pulmonary effort is normal.     Breath sounds: Normal breath sounds.  Musculoskeletal:     Right lower leg: No edema.     Left lower leg: No edema.  Skin:    General: Skin is warm and dry.  Neurological:     Mental Status: She is alert.     Cranial Nerves: Cranial nerves are intact.     Motor: No weakness, tremor, atrophy or pronator drift.     Coordination: Romberg sign negative. Coordination normal. Finger-Nose-Finger Test and Heel to [facility] Test normal.  Psychiatric:        Mood and Affect: Mood and affect normal.     BP 110/70   Pulse 75   Temp 98.1 F (36.7 C)   Ht 5\' 6"  (1.676 m)   Wt 178 lb 3.2 oz (80.8 kg)   SpO2 98%   BMI 28.76 kg/m  Wt Readings from Last 3 Encounters:  01/06/21 178 lb 3.2 oz (80.8 kg)  12/27/20 168 lb 6.4 oz (76.4 kg)  10/04/20 173 lb 1.6 oz (78.5 kg)     Health Maintenance Due  Topic Date Due  . DEXA SCAN  Never done  . PNA vac Low Risk Adult (2 of 2 - PPSV23) 05/09/2017  . MAMMOGRAM  01/28/2018  . TETANUS/TDAP  08/27/2018    There are no preventive care reminders to display for this  patient.  Lab Results  Component Value Date   TSH 2.20 04/03/2020   Lab Results  Component Value Date   WBC 11.9 (H) 10/04/2020   HGB 13.2 10/04/2020   HCT 40.9 10/04/2020   MCV 84.9 10/04/2020   PLT 360 10/04/2020   Lab Results  Component Value Date   NA 142 09/13/2020   K 3.6 09/13/2020   CO2 25 09/13/2020   GLUCOSE 110 (H)  09/13/2020   BUN 10 09/13/2020   CREATININE 0.61 09/13/2020   BILITOT 3.3 (H) 09/13/2020   ALKPHOS 117 09/13/2020   AST 34 09/13/2020   ALT 15 09/13/2020   PROT 4.6 (L) 09/13/2020   ALBUMIN 2.9 (L) 09/13/2020   CALCIUM 8.1 (L) 09/13/2020   ANIONGAP 5 09/13/2020   GFR 90.00 05/31/2020      Assessment & Plan:   Problem List Items Addressed This Visit      Other   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 traZODone (DESYREL) 100 MG tablet    Sig: Take 1 tablet (100 mg total) by mouth at bedtime.    Dispense:  30 tablet    Refill:  0    Pt requests non-child proof bottle due to arthritis in hands and difficulty opening.    Follow-up: No follow-ups on file.   1. Myelofibrosis ([place]) She has follow-up in a few weeks with her oncologist.  2. Primary insomnia We will try to increase the trazodone to 100 mg at bedtime.  If this goes well then we can continue this course.  Otherwise I talked to her about hydroxyzine and that might be another option to try.  I expressed my concern about lorazepam and benzodiazepine use for insomnia and she is agreeable and understanding that this is probably not a good option.  3. Depression, major, single episode, mild (HCC) Trazodone may be helping this somewhat.  We will still need to explore this further and consider additional therapy if this is persistent.  4. Nausea 5.  Dizziness Unsure if these symptoms are coming from her cancer treatment or just stopping the Unisom or maybe even the trazodone that was started.  She is able to eat and she is not losing weight.  She is going to discuss with her oncologist as well.  If the increase in trazodone worsens the symptom then obviously will stop the trazodone. No focal neurologic findings on exam.  This visit occurred during the SARS-CoV-2 public health emergency.  Safety protocols were in place, including screening questions prior to the visit, additional usage of staff PPE, and extensive cleaning of exam room while observing appropriate contact time as indicated for disinfecting solutions.   Kanya Potteiger M Eamonn Sermeno, PA-C

## 2021-01-09 DIAGNOSIS — I81 Portal vein thrombosis: Secondary | ICD-10-CM | POA: Diagnosis not present

## 2021-01-09 DIAGNOSIS — D649 Anemia, unspecified: Secondary | ICD-10-CM | POA: Diagnosis not present

## 2021-01-09 DIAGNOSIS — R5383 Other fatigue: Secondary | ICD-10-CM | POA: Diagnosis not present

## 2021-01-09 DIAGNOSIS — K7460 Unspecified cirrhosis of liver: Secondary | ICD-10-CM | POA: Diagnosis not present

## 2021-01-09 DIAGNOSIS — R0602 Shortness of breath: Secondary | ICD-10-CM | POA: Diagnosis not present

## 2021-01-09 DIAGNOSIS — R161 Splenomegaly, not elsewhere classified: Secondary | ICD-10-CM | POA: Diagnosis not present

## 2021-01-09 DIAGNOSIS — L299 Pruritus, unspecified: Secondary | ICD-10-CM | POA: Diagnosis not present

## 2021-01-09 DIAGNOSIS — D7581 Myelofibrosis: Secondary | ICD-10-CM | POA: Diagnosis not present

## 2021-01-09 DIAGNOSIS — D6489 Other specified anemias: Secondary | ICD-10-CM | POA: Diagnosis not present

## 2021-01-13 ENCOUNTER — Other Ambulatory Visit: Payer: Self-pay | Admitting: Physician Assistant

## 2021-01-13 MED ORDER — HYDROXYZINE HCL 25 MG PO TABS: 25.0000 mg | ORAL_TABLET | Freq: Every evening | ORAL | 0 refills | Status: DC

## 2021-01-20 ENCOUNTER — Encounter: Payer: Self-pay | Admitting: Physician Assistant

## 2021-01-23 ENCOUNTER — Other Ambulatory Visit: Payer: Self-pay

## 2021-01-23 ENCOUNTER — Other Ambulatory Visit: Payer: Self-pay | Admitting: Physician Assistant

## 2021-01-23 ENCOUNTER — Ambulatory Visit (HOSPITAL_COMMUNITY): Payer: Medicare PPO | Attending: Cardiovascular Disease

## 2021-01-23 DIAGNOSIS — R011 Cardiac murmur, unspecified: Secondary | ICD-10-CM | POA: Insufficient documentation

## 2021-01-23 LAB — ECHOCARDIOGRAM COMPLETE
AR max vel: 2.58 cm2
AV Area VTI: 2.77 cm2
AV Area mean vel: 2.54 cm2
AV Mean grad: 11.2 mmHg
AV Peak grad: 19.1 mmHg
Ao pk vel: 2.18 m/s
Area-P 1/2: 2.2 cm2
S' Lateral: 3 cm

## 2021-01-23 MED ORDER — SERTRALINE HCL 25 MG PO TABS: 25.0000 mg | ORAL_TABLET | Freq: Every day | ORAL | 0 refills | Status: DC

## 2021-01-24 DIAGNOSIS — D471 Chronic myeloproliferative disease: Secondary | ICD-10-CM | POA: Diagnosis not present

## 2021-01-27 ENCOUNTER — Encounter: Payer: Self-pay | Admitting: Physician Assistant

## 2021-01-27 ENCOUNTER — Other Ambulatory Visit: Payer: Self-pay

## 2021-01-27 ENCOUNTER — Ambulatory Visit (INDEPENDENT_AMBULATORY_CARE_PROVIDER_SITE_OTHER): Payer: Medicare PPO | Admitting: Physician Assistant

## 2021-01-27 VITALS — BP 106/58 | HR 79 | Temp 98.2°F | Ht 66.0 in | Wt 163.2 lb

## 2021-01-27 DIAGNOSIS — F5101 Primary insomnia: Secondary | ICD-10-CM

## 2021-01-27 DIAGNOSIS — F320 Major depressive disorder, single episode, mild: Secondary | ICD-10-CM

## 2021-01-27 MED ORDER — HYDROXYZINE HCL 50 MG PO TABS: 50.0000 mg | ORAL_TABLET | Freq: Every day | ORAL | 3 refills | Status: DC

## 2021-01-27 MED ORDER — SERTRALINE HCL 25 MG PO TABS: 25.0000 mg | ORAL_TABLET | Freq: Every day | ORAL | 3 refills | Status: DC

## 2021-01-27 NOTE — Progress Notes (Signed)
Established Patient Office Visit  Subjective:  Patient ID: Courtney Buckley, female    DOB: 06/23/1951  Age: 70 y.o. MRN: [id number]  CC:  Chief Complaint  Patient presents with  . Insomnia    HPI Courtney Buckley presents for medication recheck. Pt states that she has started on Sertraline 25 mg daily and so far does not have any side effects. She does feel like she might have a little more energy and slightly less anxiety / depression. She is still having trouble falling asleep at night. She is now taking Hydroxyzine 50 mg at bedtime.   Past Medical History:  Diagnosis Date  . Cirrhosis ([place])   . Colon polyps   . Depression   . Esophageal varices ([place])   . Gastritis   . Internal hemorrhoids   . Migraine   . Portal hypertensive gastropathy ([place])   . PVT (portal vein thrombosis)     Past Surgical History:  Procedure Laterality Date  . LIVER BIOPSY      Family History  Problem Relation Age of Onset  . Cancer - Lung Father   . Diabetes Mellitus II Maternal Aunt   . Diabetes Mellitus II Maternal Uncle   . Stomach cancer Maternal Aunt   . Colon cancer Neg Hx   . Rectal cancer Neg Hx   . Esophageal cancer Neg Hx   . Liver disease Neg Hx   . Pancreatic cancer Neg Hx     Social History   Socioeconomic History  . Marital status: Significant Other    Spouse name: Not on file  . Number of children: Not on file  . Years of education: Not on file  . Highest education level: Not on file  Occupational History  . Not on file  Tobacco Use  . Smoking status: Former Smoker    Types: Cigarettes  . Smokeless tobacco: Never Used  Vaping Use  . Vaping Use: Never used  Substance and Sexual Activity  . Alcohol use: Yes    Comment: Occasionally-1 acoholic beverage every 1-2 weeks  . Drug use: No  . Sexual activity: Not on file  Other Topics Concern  . Not on file  Social History Narrative  . Not on file   Social Determinants of Health   Financial Resource  Strain: Not on file  Food Insecurity: Not on file  Transportation Needs: Not on file  Physical Activity: Not on file  Stress: Not on file  Social Connections: Not on file  Intimate Partner Violence: Not on file    Outpatient Medications Prior to Visit  Medication Sig Dispense Refill  . hydrOXYzine (ATARAX/VISTARIL) 25 MG tablet Take 1 tablet (25 mg total) by mouth at bedtime. 30 tablet 0  . JAKAFI 20 MG tablet TAKE 1 TABLET (20 MG TOTAL) BY MOUTH 2 (TWO) TIMES DAILY. 60 tablet 0  . Multiple Vitamin (MULTIVITAMIN ADULT PO) Take by mouth.    . sertraline (ZOLOFT) 25 MG tablet Take 1 tablet (25 mg total) by mouth daily. 30 tablet 0  . traZODone (DESYREL) 100 MG tablet Take 1 tablet (100 mg total) by mouth at bedtime. 30 tablet 0   No facility-administered medications prior to visit.    No Known Allergies  ROS Review of Systems  Respiratory: Negative for chest tightness and shortness of breath.   Cardiovascular: Negative for chest pain and palpitations.  Psychiatric/Behavioral: Positive for sleep disturbance. Negative for agitation, behavioral problems and confusion. The patient is nervous/anxious.       Objective:  Physical Exam Vitals and nursing note reviewed.  Constitutional:      Appearance: Normal appearance.  Neurological:     Mental Status: She is alert and oriented to person, place, and time.  Psychiatric:        Mood and Affect: Mood normal.        Behavior: Behavior normal.        Thought Content: Thought content normal.        Judgment: Judgment normal.   TALK ONLY TODAY   BP (!) 106/58   Pulse 79   Temp 98.2 F (36.8 C)   Ht 5\' 6"  (1.676 m)   Wt 163 lb 3.2 oz (74 kg)   SpO2 99%   BMI 26.34 kg/m  Wt Readings from Last 3 Encounters:  01/27/21 163 lb 3.2 oz (74 kg)  01/06/21 178 lb 3.2 oz (80.8 kg)  12/27/20 168 lb 6.4 oz (76.4 kg)     Health Maintenance Due  Topic Date Due  . DEXA SCAN  Never done  . PNA vac Low Risk Adult (2 of 2 - PPSV23)  05/09/2017  . MAMMOGRAM  01/28/2018  . TETANUS/TDAP  08/27/2018  . COVID-19 Vaccine (4 - Booster for Pfizer series) 01/27/2021    There are no preventive care reminders to display for this patient.  Lab Results  Component Value Date   TSH 2.20 04/03/2020   Lab Results  Component Value Date   WBC 11.9 (H) 10/04/2020   HGB 13.2 10/04/2020   HCT 40.9 10/04/2020   MCV 84.9 10/04/2020   PLT 360 10/04/2020   Lab Results  Component Value Date   NA 142 09/13/2020   K 3.6 09/13/2020   CO2 25 09/13/2020   GLUCOSE 110 (H) 09/13/2020   BUN 10 09/13/2020   CREATININE 0.61 09/13/2020   BILITOT 3.3 (H) 09/13/2020   ALKPHOS 117 09/13/2020   AST 34 09/13/2020   ALT 15 09/13/2020   PROT 4.6 (L) 09/13/2020   ALBUMIN 2.9 (L) 09/13/2020   CALCIUM 8.1 (L) 09/13/2020   ANIONGAP 5 09/13/2020   GFR 90.00 05/31/2020     Assessment & Plan:   Problem List Items Addressed This Visit   None   Visit Diagnoses    Primary insomnia    -  Primary   Depression, major, single episode, mild (HCC)       Relevant Medications   hydrOXYzine (ATARAX/VISTARIL) 50 MG tablet   sertraline (ZOLOFT) 25 MG tablet      Meds ordered this encounter  Medications  . hydrOXYzine (ATARAX/VISTARIL) 50 MG tablet    Sig: Take 1 tablet (50 mg total) by mouth at bedtime.    Dispense:  30 tablet    Refill:  3  . sertraline (ZOLOFT) 25 MG tablet    Sig: Take 1 tablet (25 mg total) by mouth daily.    Dispense:  30 tablet    Refill:  3    Follow-up: Return in about 4 months (around 05/29/2021) for Med recheck with Gehrig Patras .   1. Primary insomnia 2. Depression, major, single episode, mild ([place]) Will continue on Hydroxyzine 50 mg at bedtime as it does help her to stay asleep through the night. I am hoping that as Zoloft continues to work in her system, this will help her anxiety and depression, which will in turn help her to fall asleep easier. Informed patient that we may consider increasing Zoloft to 50 mg in a few  weeks if needed. She will keep me updated  through [facility].  This visit occurred during the SARS-CoV-2 public health emergency.  Safety protocols were in place, including screening questions prior to the visit, additional usage of staff PPE, and extensive cleaning of exam room while observing appropriate contact time as indicated for disinfecting solutions.   Jaionna Weisse M Saveah Bahar, PA-C

## 2021-01-27 NOTE — Patient Instructions (Signed)
Please keep me updated through [facility] on your progress!

## 2021-01-30 DIAGNOSIS — H2511 Age-related nuclear cataract, right eye: Secondary | ICD-10-CM | POA: Diagnosis not present

## 2021-02-06 DIAGNOSIS — Z79899 Other long term (current) drug therapy: Secondary | ICD-10-CM | POA: Diagnosis not present

## 2021-02-06 DIAGNOSIS — D7581 Myelofibrosis: Secondary | ICD-10-CM | POA: Diagnosis not present

## 2021-02-06 DIAGNOSIS — R161 Splenomegaly, not elsewhere classified: Secondary | ICD-10-CM | POA: Diagnosis not present

## 2021-02-06 DIAGNOSIS — R1012 Left upper quadrant pain: Secondary | ICD-10-CM | POA: Diagnosis not present

## 2021-02-07 DIAGNOSIS — H2512 Age-related nuclear cataract, left eye: Secondary | ICD-10-CM | POA: Diagnosis not present

## 2021-02-10 ENCOUNTER — Telehealth: Payer: Self-pay

## 2021-02-10 NOTE — Telephone Encounter (Signed)
Rx request 

## 2021-02-10 NOTE — Telephone Encounter (Signed)
..   LAST APPOINTMENT DATE: 01/27/2021   NEXT APPOINTMENT DATE:@8 /03/2021  MEDICATION:traZODone (DESYREL) 100 MG tablet [[id number]   PHARMACY:[id number] [place], [place]

## 2021-02-11 ENCOUNTER — Other Ambulatory Visit: Payer: Self-pay

## 2021-02-11 ENCOUNTER — Other Ambulatory Visit: Payer: Self-pay | Admitting: Physician Assistant

## 2021-02-11 MED ORDER — HYDROXYZINE HCL 50 MG PO TABS: 50.0000 mg | ORAL_TABLET | Freq: Every day | ORAL | 0 refills | Status: DC

## 2021-02-11 NOTE — Telephone Encounter (Signed)
Patient needed clarification on Rx that she is taking...resolved taking Hydroxyzine

## 2021-03-01 DIAGNOSIS — J22 Unspecified acute lower respiratory infection: Secondary | ICD-10-CM | POA: Diagnosis not present

## 2021-03-01 DIAGNOSIS — Z20822 Contact with and (suspected) exposure to covid-19: Secondary | ICD-10-CM | POA: Diagnosis not present

## 2021-03-01 DIAGNOSIS — R0981 Nasal congestion: Secondary | ICD-10-CM | POA: Diagnosis not present

## 2021-03-01 DIAGNOSIS — R0989 Other specified symptoms and signs involving the circulatory and respiratory systems: Secondary | ICD-10-CM | POA: Diagnosis not present

## 2021-03-01 DIAGNOSIS — R059 Cough, unspecified: Secondary | ICD-10-CM | POA: Diagnosis not present

## 2021-03-03 DIAGNOSIS — D638 Anemia in other chronic diseases classified elsewhere: Secondary | ICD-10-CM | POA: Diagnosis not present

## 2021-03-03 DIAGNOSIS — D471 Chronic myeloproliferative disease: Secondary | ICD-10-CM | POA: Diagnosis not present

## 2021-03-03 DIAGNOSIS — D7581 Myelofibrosis: Secondary | ICD-10-CM | POA: Diagnosis not present

## 2021-03-07 DIAGNOSIS — R059 Cough, unspecified: Secondary | ICD-10-CM | POA: Diagnosis not present

## 2021-03-07 DIAGNOSIS — Z20822 Contact with and (suspected) exposure to covid-19: Secondary | ICD-10-CM | POA: Diagnosis not present

## 2021-03-07 DIAGNOSIS — R5383 Other fatigue: Secondary | ICD-10-CM | POA: Diagnosis not present

## 2021-03-07 DIAGNOSIS — R1030 Lower abdominal pain, unspecified: Secondary | ICD-10-CM | POA: Diagnosis not present

## 2021-03-12 ENCOUNTER — Other Ambulatory Visit: Payer: Self-pay | Admitting: Physician Assistant

## 2021-03-13 MED ORDER — HYDROXYZINE HCL 50 MG PO TABS: 50.0000 mg | ORAL_TABLET | Freq: Every day | ORAL | 0 refills | Status: DC

## 2021-03-13 MED ORDER — SERTRALINE HCL 25 MG PO TABS: 25.0000 mg | ORAL_TABLET | Freq: Every day | ORAL | 3 refills | Status: DC

## 2021-03-17 DIAGNOSIS — D471 Chronic myeloproliferative disease: Secondary | ICD-10-CM | POA: Diagnosis not present

## 2021-04-02 DIAGNOSIS — Z20822 Contact with and (suspected) exposure to covid-19: Secondary | ICD-10-CM | POA: Diagnosis not present

## 2021-04-03 DIAGNOSIS — D7581 Myelofibrosis: Secondary | ICD-10-CM | POA: Diagnosis not present

## 2021-04-03 DIAGNOSIS — D471 Chronic myeloproliferative disease: Secondary | ICD-10-CM | POA: Diagnosis not present

## 2021-04-03 DIAGNOSIS — Z79899 Other long term (current) drug therapy: Secondary | ICD-10-CM | POA: Diagnosis not present

## 2021-04-11 DIAGNOSIS — D471 Chronic myeloproliferative disease: Secondary | ICD-10-CM | POA: Diagnosis not present

## 2021-04-11 DIAGNOSIS — D638 Anemia in other chronic diseases classified elsewhere: Secondary | ICD-10-CM | POA: Diagnosis not present

## 2021-04-25 DIAGNOSIS — D6489 Other specified anemias: Secondary | ICD-10-CM | POA: Diagnosis not present

## 2021-04-25 DIAGNOSIS — D7581 Myelofibrosis: Secondary | ICD-10-CM | POA: Diagnosis not present

## 2021-04-25 DIAGNOSIS — D638 Anemia in other chronic diseases classified elsewhere: Secondary | ICD-10-CM | POA: Diagnosis not present

## 2021-05-08 DIAGNOSIS — I81 Portal vein thrombosis: Secondary | ICD-10-CM | POA: Diagnosis not present

## 2021-05-08 DIAGNOSIS — Z79899 Other long term (current) drug therapy: Secondary | ICD-10-CM | POA: Diagnosis not present

## 2021-05-08 DIAGNOSIS — R161 Splenomegaly, not elsewhere classified: Secondary | ICD-10-CM | POA: Diagnosis not present

## 2021-05-08 DIAGNOSIS — R519 Headache, unspecified: Secondary | ICD-10-CM | POA: Diagnosis not present

## 2021-05-08 DIAGNOSIS — R110 Nausea: Secondary | ICD-10-CM | POA: Diagnosis not present

## 2021-05-08 DIAGNOSIS — Z8616 Personal history of COVID-19: Secondary | ICD-10-CM | POA: Diagnosis not present

## 2021-05-08 DIAGNOSIS — D471 Chronic myeloproliferative disease: Secondary | ICD-10-CM | POA: Diagnosis not present

## 2021-05-08 DIAGNOSIS — G43909 Migraine, unspecified, not intractable, without status migrainosus: Secondary | ICD-10-CM | POA: Diagnosis not present

## 2021-05-08 DIAGNOSIS — R42 Dizziness and giddiness: Secondary | ICD-10-CM | POA: Diagnosis not present

## 2021-05-08 DIAGNOSIS — F32A Depression, unspecified: Secondary | ICD-10-CM | POA: Diagnosis not present

## 2021-05-08 DIAGNOSIS — D75839 Thrombocytosis, unspecified: Secondary | ICD-10-CM | POA: Diagnosis not present

## 2021-05-08 DIAGNOSIS — Z5111 Encounter for antineoplastic chemotherapy: Secondary | ICD-10-CM | POA: Diagnosis not present

## 2021-05-08 DIAGNOSIS — D7581 Myelofibrosis: Secondary | ICD-10-CM | POA: Diagnosis not present

## 2021-05-16 DIAGNOSIS — R110 Nausea: Secondary | ICD-10-CM | POA: Diagnosis not present

## 2021-05-16 DIAGNOSIS — K8020 Calculus of gallbladder without cholecystitis without obstruction: Secondary | ICD-10-CM | POA: Diagnosis not present

## 2021-05-22 ENCOUNTER — Ambulatory Visit: Payer: Medicare PPO

## 2021-05-26 ENCOUNTER — Ambulatory Visit (INDEPENDENT_AMBULATORY_CARE_PROVIDER_SITE_OTHER): Payer: Medicare PPO

## 2021-05-26 DIAGNOSIS — E2839 Other primary ovarian failure: Secondary | ICD-10-CM

## 2021-05-26 DIAGNOSIS — Z1231 Encounter for screening mammogram for malignant neoplasm of breast: Secondary | ICD-10-CM

## 2021-05-26 DIAGNOSIS — Z0000 Encounter for general adult medical examination without abnormal findings: Secondary | ICD-10-CM | POA: Diagnosis not present

## 2021-05-26 NOTE — Progress Notes (Signed)
Virtual Visit via Telephone Note  I connected with  Courtney Buckley on 05/26/21 at 11:00 AM EDT by telephone and verified that I am speaking with the correct person using two identifiers.  Medicare Annual Wellness visit completed telephonically due to Covid-19 pandemic.   Persons participating in this call: This Health Coach and this patient.   Location: Patient: Home Provider: Office    I discussed the limitations, risks, security and privacy concerns of performing an evaluation and management service by telephone and the availability of in person appointments. The patient expressed understanding and agreed to proceed.  Unable to perform video visit due to video visit attempted and failed and/or patient does not have video capability.   Some vital signs may be absent or patient reported.   Willette Brace, LPN   Subjective:   Courtney Buckley is a 70 y.o. female who presents for Medicare Annual (Subsequent) preventive examination.  Review of Systems           Objective:    There were no vitals filed for this visit. There is no height or weight on file to calculate BMI.  Advanced Directives 10/04/2020 09/13/2020  Does Patient Have a Medical Advance Directive? No No  Would patient like information on creating a medical advance directive? No - Patient declined No - Patient declined    Current Medications (verified) Outpatient Encounter Medications as of 05/26/2021  Medication Sig   hydrOXYzine (ATARAX/VISTARIL) 50 MG tablet Take 1 tablet (50 mg total) by mouth at bedtime.   Multiple Vitamin (MULTIVITAMIN ADULT PO) Take by mouth.   ruxolitinib phosphate (JAKAFI) 20 MG tablet TAKE 1 TABLET (20 MG TOTAL) BY MOUTH 2 (TWO) TIMES DAILY.   sertraline (ZOLOFT) 25 MG tablet Take 1 tablet (25 mg total) by mouth daily.   No facility-administered encounter medications on file as of 05/26/2021.    Allergies (verified) Patient has no known allergies.   History: Past Medical  History:  Diagnosis Date   Cirrhosis ([place])    Colon polyps    Depression    Esophageal varices ([facility])    Gastritis    Internal hemorrhoids    Migraine    Portal hypertensive gastropathy ([facility])    PVT (portal vein thrombosis)    Past Surgical History:  Procedure Laterality Date   LIVER BIOPSY     Family History  Problem Relation Age of Onset   Cancer - Lung Father    Diabetes Mellitus II Maternal Aunt    Diabetes Mellitus II Maternal Uncle    Stomach cancer Maternal Aunt    Colon cancer Neg Hx    Rectal cancer Neg Hx    Esophageal cancer Neg Hx    Liver disease Neg Hx    Pancreatic cancer Neg Hx    Social History   Socioeconomic History   Marital status: Significant Other    Spouse name: Not on file   Number of children: Not on file   Years of education: Not on file   Highest education level: Not on file  Occupational History   Not on file  Tobacco Use   Smoking status: Former    Types: Cigarettes   Smokeless tobacco: Never  Vaping Use   Vaping Use: Never used  Substance and Sexual Activity   Alcohol use: Yes    Comment: Occasionally-1 acoholic beverage every 1-2 weeks   Drug use: No   Sexual activity: Not on file  Other Topics Concern   Not on file  Social History Narrative  Not on file   Social Determinants of Health   Financial Resource Strain: Not on file  Food Insecurity: Not on file  Transportation Needs: Not on file  Physical Activity: Not on file  Stress: Not on file  Social Connections: Not on file    Tobacco Counseling Counseling given: Not Answered   Clinical Intake:                 Diabetic?No         Activities of Daily Living No flowsheet data found.  Patient Care Team: Allwardt, Randa Evens, PA-C as PCP - General (Physician Assistant)  Indicate any recent Medical Services you may have received from other than [facility] providers in the past year (date may be approximate).     Assessment:   This is a routine wellness  examination for Courtney Buckley.  Hearing/Vision screen No results found.  Dietary issues and exercise activities discussed:     Goals Addressed   None    Depression Screen No flowsheet data found.  Fall Risk Fall Risk  12/27/2020  Falls in the past year? 0  Number falls in past yr: 0  Injury with Fall? 0  Comment Just bruising    FALL RISK PREVENTION PERTAINING TO THE HOME:  Any stairs in or around the home? No  If so, are there any without handrails? No  Home free of loose throw rugs in walkways, pet beds, electrical cords, etc? Yes  Adequate lighting in your home to reduce risk of falls? Yes   ASSISTIVE DEVICES UTILIZED TO PREVENT FALLS:  Life alert? No  Use of a cane, walker or w/c? No  Grab bars in the bathroom? No  Shower chair or bench in shower? No  Elevated toilet seat or a handicapped toilet? No   TIMED UP AND GO:  Was the test performed? No     Cognitive Function:        Immunizations Immunization History  Administered Date(s) Administered   Fluad Quad(high Dose 65+) 07/25/2020   Hepb-cpg 06/02/2019, 07/27/2019   Influenza, High Dose Seasonal PF 08/05/2018, 08/10/2019   PFIZER(Purple Top)SARS-COV-2 Vaccination 12/15/2019, 01/09/2020, 07/30/2020   Zoster Recombinat (Shingrix) 08/10/2019, 05/04/2020    TDAP status: Due, Education has been provided regarding the importance of this vaccine. Advised may receive this vaccine at local pharmacy or Health Dept. Aware to provide a copy of the vaccination record if obtained from local pharmacy or Health Dept. Verbalized acceptance and understanding.  Flu Vaccine status: Up to date  Pneumococcal vaccine status: Due, Education has been provided regarding the importance of this vaccine. Advised may receive this vaccine at local pharmacy or Health Dept. Aware to provide a copy of the vaccination record if obtained from local pharmacy or Health Dept. Verbalized acceptance and understanding.  Covid-19 vaccine status:  Completed vaccines  Qualifies for Shingles Vaccine? Yes   Zostavax completed Yes   Shingrix Completed?: Yes  Screening Tests Health Maintenance  Topic Date Due   TETANUS/TDAP  Never done   DEXA SCAN  Never done   PNA vac Low Risk Adult (1 of 2 - PCV13) Never done   MAMMOGRAM  01/28/2018   INFLUENZA VACCINE  06/02/2021   COVID-19 Vaccine  Completed   Hepatitis C Screening  Completed   Zoster Vaccines- Shingrix  Completed   HPV VACCINES  Aged Out    Health Maintenance  Health Maintenance Due  Topic Date Due   TETANUS/TDAP  Never done   DEXA SCAN  Never done  PNA vac Low Risk Adult (1 of 2 - PCV13) Never done   MAMMOGRAM  01/28/2018    Colorectal cancer screening: No longer required.   Mammogram status: Ordered 05/26/21. Pt provided with contact info and advised to call to schedule appt.   Bone Density status: Ordered 05/26/21. Pt provided with contact info and advised to call to schedule appt.  Additional Screening:  Hepatitis C Screening:  Completed 12/07/18  Vision Screening: Recommended annual ophthalmology exams for early detection of glaucoma and other disorders of the eye. Is the patient up to date with their annual eye exam?  Yes  Who is the provider or what is the name of the office in which the patient attends annual eye exams? Dr Alanda Slim If pt is not established with a provider, would they like to be referred to a provider to establish care? No .   Dental Screening: Recommended annual dental exams for proper oral hygiene  Community Resource Referral / Chronic Care Management: CRR required this visit?  No   CCM required this visit?  No      Plan:     I have personally reviewed and noted the following in the patient's chart:   Medical and social history Use of alcohol, tobacco or illicit drugs  Current medications and supplements including opioid prescriptions.  Functional ability and status Nutritional status Physical activity Advanced  directives List of other physicians Hospitalizations, surgeries, and ER visits in previous 12 months Vitals Screenings to include cognitive, depression, and falls Referrals and appointments  In addition, I have reviewed and discussed with patient certain preventive protocols, quality metrics, and best practice recommendations. A written personalized care plan for preventive services as well as general preventive health recommendations were provided to patient.     Willette Brace, LPN   [id number]   Nurse Notes: None

## 2021-05-26 NOTE — Patient Instructions (Signed)
[name] , Thank you for taking time to come for your Medicare Wellness Visit. I appreciate your ongoing commitment to your health goals. Please review the following plan we discussed and let me know if I can assist you in the future.   Screening recommendations/referrals: Colonoscopy: No longer required  Mammogram: Order placed 05/26/21 Bone Density: order placed 05/26/21 Recommended yearly ophthalmology/optometry visit for glaucoma screening and checkup Recommended yearly dental visit for hygiene and checkup  Vaccinations: Influenza vaccine: Due after 06/02/21 Pneumococcal vaccine: Due and discussed Tdap vaccine: Due and discussed Shingles vaccine: Completed 08/10/19 & 05/04/20   Covid-19:Completed 2/12, 3/9, 07/30/20 & 02/05/21   Advanced directives: Please bring a copy of your health care power of attorney and living will to the office at your convenience.  Conditions/risks identified: Survive bone cancer and continue to walk   Next appointment: Follow up in one year for your annual wellness visit    Preventive Care 65 Years and Older, Female Preventive care refers to lifestyle choices and visits with your health care provider that can promote health and wellness. What does preventive care include? A yearly physical exam. This is also called an annual well check. Dental exams once or twice a year. Routine eye exams. Ask your health care provider how often you should have your eyes checked. Personal lifestyle choices, including: Daily care of your teeth and gums. Regular physical activity. Eating a healthy diet. Avoiding tobacco and drug use. Limiting alcohol use. Practicing safe sex. Taking low-dose aspirin every day. Taking vitamin and mineral supplements as recommended by your health care provider. What happens during an annual well check? The services and screenings done by your health care provider during your annual well check will depend on your age, overall health,  lifestyle risk factors, and family history of disease. Counseling  Your health care provider may ask you questions about your: Alcohol use. Tobacco use. Drug use. Emotional well-being. Home and relationship well-being. Sexual activity. Eating habits. History of falls. Memory and ability to understand (cognition). Work and work Statistician. Reproductive health. Screening  You may have the following tests or measurements: Height, weight, and BMI. Blood pressure. Lipid and cholesterol levels. These may be checked every 5 years, or more frequently if you are over 46 years old. Skin check. Lung cancer screening. You may have this screening every year starting at age 64 if you have a 30-pack-year history of smoking and currently smoke or have quit within the past 15 years. Fecal occult blood test (FOBT) of the stool. You may have this test every year starting at age 25. Flexible sigmoidoscopy or colonoscopy. You may have a sigmoidoscopy every 5 years or a colonoscopy every 10 years starting at age 73. Hepatitis C blood test. Hepatitis B blood test. Sexually transmitted disease (STD) testing. Diabetes screening. This is done by checking your blood sugar (glucose) after you have not eaten for a while (fasting). You may have this done every 1-3 years. Bone density scan. This is done to screen for osteoporosis. You may have this done starting at age 64. Mammogram. This may be done every 1-2 years. Talk to your health care provider about how often you should have regular mammograms. Talk with your health care provider about your test results, treatment options, and if necessary, the need for more tests. Vaccines  Your health care provider may recommend certain vaccines, such as: Influenza vaccine. This is recommended every year. Tetanus, diphtheria, and acellular pertussis (Tdap, Td) vaccine. You may need a Td  booster every 10 years. Zoster vaccine. You may need this after age  89. Pneumococcal 13-valent conjugate (PCV13) vaccine. One dose is recommended after age 75. Pneumococcal polysaccharide (PPSV23) vaccine. One dose is recommended after age 88. Talk to your health care provider about which screenings and vaccines you need and how often you need them. This information is not intended to replace advice given to you by your health care provider. Make sure you discuss any questions you have with your health care provider. Document Released: 11/15/2015 Document Revised: 07/08/2016 Document Reviewed: 08/20/2015 Elsevier Interactive Patient Education  2017 [place] Prevention in the Home Falls can cause injuries. They can happen to people of all ages. There are many things you can do to make your home safe and to help prevent falls. What can I do on the outside of my home? Regularly fix the edges of walkways and driveways and fix any cracks. Remove anything that might make you trip as you walk through a door, such as a raised step or threshold. Trim any bushes or trees on the path to your home. Use bright outdoor lighting. Clear any walking paths of anything that might make someone trip, such as rocks or tools. Regularly check to see if handrails are loose or broken. Make sure that both sides of any steps have handrails. Any raised decks and porches should have guardrails on the edges. Have any leaves, snow, or ice cleared regularly. Use sand or salt on walking paths during winter. Clean up any spills in your garage right away. This includes oil or grease spills. What can I do in the bathroom? Use night lights. Install grab bars by the toilet and in the tub and shower. Do not use towel bars as grab bars. Use non-skid mats or decals in the tub or shower. If you need to sit down in the shower, use a plastic, non-slip stool. Keep the floor dry. Clean up any water that spills on the floor as soon as it happens. Remove soap buildup in the tub or shower  regularly. Attach bath mats securely with double-sided non-slip rug tape. Do not have throw rugs and other things on the floor that can make you trip. What can I do in the bedroom? Use night lights. Make sure that you have a light by your bed that is easy to reach. Do not use any sheets or blankets that are too big for your bed. They should not hang down onto the floor. Have a firm chair that has side arms. You can use this for support while you get dressed. Do not have throw rugs and other things on the floor that can make you trip. What can I do in the kitchen? Clean up any spills right away. Avoid walking on wet floors. Keep items that you use a lot in easy-to-reach places. If you need to reach something above you, use a strong step stool that has a grab bar. Keep electrical cords out of the way. Do not use floor polish or wax that makes floors slippery. If you must use wax, use non-skid floor wax. Do not have throw rugs and other things on the floor that can make you trip. What can I do with my stairs? Do not leave any items on the stairs. Make sure that there are handrails on both sides of the stairs and use them. Fix handrails that are broken or loose. Make sure that handrails are as long as the stairways. Check any carpeting  to make sure that it is firmly attached to the stairs. Fix any carpet that is loose or worn. Avoid having throw rugs at the top or bottom of the stairs. If you do have throw rugs, attach them to the floor with carpet tape. Make sure that you have a light switch at the top of the stairs and the bottom of the stairs. If you do not have them, ask someone to add them for you. What else can I do to help prevent falls? Wear shoes that: Do not have high heels. Have rubber bottoms. Are comfortable and fit you well. Are closed at the toe. Do not wear sandals. If you use a stepladder: Make sure that it is fully opened. Do not climb a closed stepladder. Make sure that  both sides of the stepladder are locked into place. Ask someone to hold it for you, if possible. Clearly mark and make sure that you can see: Any grab bars or handrails. First and last steps. Where the edge of each step is. Use tools that help you move around (mobility aids) if they are needed. These include: Canes. Walkers. Scooters. Crutches. Turn on the lights when you go into a dark area. Replace any light bulbs as soon as they burn out. Set up your furniture so you have a clear path. Avoid moving your furniture around. If any of your floors are uneven, fix them. If there are any pets around you, be aware of where they are. Review your medicines with your doctor. Some medicines can make you feel dizzy. This can increase your chance of falling. Ask your doctor what other things that you can do to help prevent falls. This information is not intended to replace advice given to you by your health care provider. Make sure you discuss any questions you have with your health care provider. Document Released: 08/15/2009 Document Revised: 03/26/2016 Document Reviewed: 11/23/2014 Elsevier Interactive Patient Education  2017 [employer].

## 2021-06-05 DIAGNOSIS — D7581 Myelofibrosis: Secondary | ICD-10-CM | POA: Diagnosis not present

## 2021-06-06 ENCOUNTER — Ambulatory Visit: Payer: Medicare PPO | Admitting: Physician Assistant

## 2021-06-13 ENCOUNTER — Encounter: Payer: Self-pay | Admitting: Physician Assistant

## 2021-06-13 ENCOUNTER — Ambulatory Visit: Payer: Medicare PPO | Admitting: Physician Assistant

## 2021-06-13 ENCOUNTER — Other Ambulatory Visit: Payer: Self-pay

## 2021-06-13 VITALS — BP 118/72 | HR 75 | Temp 98.3°F | Ht 66.0 in | Wt 173.2 lb

## 2021-06-13 DIAGNOSIS — Z1231 Encounter for screening mammogram for malignant neoplasm of breast: Secondary | ICD-10-CM

## 2021-06-13 DIAGNOSIS — K7469 Other cirrhosis of liver: Secondary | ICD-10-CM | POA: Diagnosis not present

## 2021-06-13 DIAGNOSIS — F5101 Primary insomnia: Secondary | ICD-10-CM

## 2021-06-13 DIAGNOSIS — F320 Major depressive disorder, single episode, mild: Secondary | ICD-10-CM

## 2021-06-13 DIAGNOSIS — R110 Nausea: Secondary | ICD-10-CM | POA: Diagnosis not present

## 2021-06-13 DIAGNOSIS — Z9189 Other specified personal risk factors, not elsewhere classified: Secondary | ICD-10-CM | POA: Diagnosis not present

## 2021-06-13 MED ORDER — ZOLPIDEM TARTRATE 5 MG PO TABS: 5.0000 mg | ORAL_TABLET | Freq: Every evening | ORAL | 0 refills | Status: DC | PRN

## 2021-06-13 NOTE — Progress Notes (Signed)
Established Patient Office Visit  Subjective:  Patient ID: Courtney Buckley, female    DOB: 08-17-1951  Age: 70 y.o. MRN: [id number]  CC:  Chief Complaint  Patient presents with   Referral   Insomnia    HPI Courtney Buckley presents for recheck.   Patient states that she is still not sleeping well at all. Previous trials for insomnia included Unisom, Trazodone, hydroxyzine, and Zoloft, all without any relief. Currently averaging about 3-4 hours per night.  Occasionally she will have one good night of rest.  States that she feels anxious in the evenings and has a hard time winding down.  She has been having issues with sleep since her cancer diagnosis.  She also reports that she is still having some issues with nausea approximately 3-4 times per week in the mornings.  She says Dr. Joan Mayans unsure of cause, but she goes back to see her next week.  In the meantime she has a prescription for Zofran and it takes a few hours for this to work.  Also requesting referral to GI locally for her history of liver cirrhosis.  Past Medical History:  Diagnosis Date   Cirrhosis ([place])    Colon polyps    Depression    Esophageal varices (HCC)    Gastritis    Internal hemorrhoids    Migraine    Portal hypertensive gastropathy (HCC)    PVT (portal vein thrombosis)     Past Surgical History:  Procedure Laterality Date   LIVER BIOPSY      Family History  Problem Relation Age of Onset   Cancer - Lung Father    Diabetes Mellitus II Maternal Aunt    Diabetes Mellitus II Maternal Uncle    Stomach cancer Maternal Aunt    Colon cancer Neg Hx    Rectal cancer Neg Hx    Esophageal cancer Neg Hx    Liver disease Neg Hx    Pancreatic cancer Neg Hx     Social History   Socioeconomic History   Marital status: Significant Other    Spouse name: Not on file   Number of children: Not on file   Years of education: Not on file   Highest education level: Not on file  Occupational History    Not on file  Tobacco Use   Smoking status: Former    Types: Cigarettes   Smokeless tobacco: Never  Vaping Use   Vaping Use: Never used  Substance and Sexual Activity   Alcohol use: Not Currently    Comment: Occasionally-1 acoholic beverage every 1-2 weeks   Drug use: No   Sexual activity: Not on file  Other Topics Concern   Not on file  Social History Narrative   Not on file   Social Determinants of Health   Financial Resource Strain: Low Risk    Difficulty of Paying Living Expenses: Not hard at all  Food Insecurity: No Food Insecurity   Worried About Charity fundraiser in the Last Year: Never true   Ran Out of Food in the Last Year: Never true  Transportation Needs: No Transportation Needs   Lack of Transportation (Medical): No   Lack of Transportation (Non-Medical): No  Physical Activity: Insufficiently Active   Days of Exercise per Week: 3 days   Minutes of Exercise per Session: 20 min  Stress: No Stress Concern Present   Feeling of Stress : Only a little  Social Connections: Moderately Isolated   Frequency of Communication with Friends and Family:  More than three times a week   Frequency of Social Gatherings with Friends and Family: More than three times a week   Attends Religious Services: Never   Marine scientist or Organizations: No   Attends Music therapist: Never   Marital Status: Married  Human resources officer Violence: Not At Risk   Fear of Current or Ex-Partner: No   Emotionally Abused: No   Physically Abused: No   Sexually Abused: No    Outpatient Medications Prior to Visit  Medication Sig Dispense Refill   Multiple Vitamin (MULTIVITAMIN ADULT PO) Take by mouth.     ruxolitinib phosphate (JAKAFI) 20 MG tablet TAKE 1 TABLET (20 MG TOTAL) BY MOUTH 2 (TWO) TIMES DAILY. 60 tablet 0   No facility-administered medications prior to visit.    No Known Allergies  ROS Review of Systems  Respiratory:  Negative for chest tightness and  shortness of breath.   Cardiovascular:  Negative for chest pain and palpitations.  Gastrointestinal:  Positive for nausea. Negative for abdominal pain.  Psychiatric/Behavioral:  Positive for sleep disturbance. Negative for agitation, behavioral problems and confusion. The patient is nervous/anxious.      Objective:    Physical Exam Vitals and nursing note reviewed.  Constitutional:      General: She is not in acute distress.    Appearance: Normal appearance. She is normal weight.  HENT:     Head: Normocephalic.     Right Ear: External ear normal.     Left Ear: External ear normal.     Nose: Nose normal.     Mouth/Throat:     Mouth: Mucous membranes are moist.  Eyes:     Extraocular Movements: Extraocular movements intact.     Conjunctiva/sclera: Conjunctivae normal.     Pupils: Pupils are equal, round, and reactive to light.  Cardiovascular:     Rate and Rhythm: Normal rate and regular rhythm.     Pulses: Normal pulses.     Heart sounds: No murmur heard. Pulmonary:     Effort: Pulmonary effort is normal.     Breath sounds: Normal breath sounds.  Abdominal:     General: Abdomen is flat. Bowel sounds are normal.     Palpations: Abdomen is soft.     Tenderness: There is no abdominal tenderness.  Musculoskeletal:        General: Normal range of motion.     Cervical back: Normal range of motion.  Skin:    General: Skin is warm.  Neurological:     General: No focal deficit present.     Mental Status: She is alert and oriented to person, place, and time.     Gait: Gait normal.  Psychiatric:        Mood and Affect: Mood normal.        Behavior: Behavior normal.    BP 118/72   Pulse 75   Temp 98.3 F (36.8 C)   Ht '5\' 6"'$  (1.676 m)   Wt 173 lb 3.2 oz (78.6 kg)   SpO2 97%   BMI 27.96 kg/m  Wt Readings from Last 3 Encounters:  06/13/21 173 lb 3.2 oz (78.6 kg)  01/27/21 163 lb 3.2 oz (74 kg)  01/06/21 178 lb 3.2 oz (80.8 kg)     Health Maintenance Due  Topic Date  Due   TETANUS/TDAP  Never done   DEXA SCAN  Never done   PNA vac Low Risk Adult (1 of 2 - PCV13) Never done  MAMMOGRAM  01/28/2018   INFLUENZA VACCINE  06/02/2021    There are no preventive care reminders to display for this patient.  Lab Results  Component Value Date   TSH 2.20 04/03/2020   Lab Results  Component Value Date   WBC 11.9 (H) 10/04/2020   HGB 13.2 10/04/2020   HCT 40.9 10/04/2020   MCV 84.9 10/04/2020   PLT 360 10/04/2020   Lab Results  Component Value Date   NA 142 09/13/2020   K 3.6 09/13/2020   CO2 25 09/13/2020   GLUCOSE 110 (H) 09/13/2020   BUN 10 09/13/2020   CREATININE 0.61 09/13/2020   BILITOT 3.3 (H) 09/13/2020   ALKPHOS 117 09/13/2020   AST 34 09/13/2020   ALT 15 09/13/2020   PROT 4.6 (L) 09/13/2020   ALBUMIN 2.9 (L) 09/13/2020   CALCIUM 8.1 (L) 09/13/2020   ANIONGAP 5 09/13/2020   GFR 90.00 05/31/2020   No results found for: CHOL No results found for: HDL No results found for: LDLCALC No results found for: TRIG No results found for: CHOLHDL No results found for: HGBA1C    Assessment & Plan:   Problem List Items Addressed This Visit   None Visit Diagnoses     Primary insomnia    -  Primary   Depression, major, single episode, mild ([place])       Nausea       Relevant Orders   Ambulatory referral to Gastroenterology   Other cirrhosis of liver ([place])       Relevant Orders   Ambulatory referral to Gastroenterology   Breast cancer screening by mammogram       Relevant Orders   MM 3D SCREEN BREAST BILATERAL   At risk for decreased bone density       Relevant Orders   DG Bone Density       Meds ordered this encounter  Medications   zolpidem (AMBIEN) 5 MG tablet    Sig: Take 1 tablet (5 mg total) by mouth at bedtime as needed for sleep.    Dispense:  15 tablet    Refill:  0    Follow-up: Return in about 6 months (around 12/14/2021) for med check.   Plan: Referrals placed today for DEXA, mammogram, and GI. We are going to  try Ambien 5 mg for sleep.  Risk versus benefits discussed.  According to beers criteria, not recommended to go over 5 mg.  If this does not work we will consider clonazepam at bedtime as this may be a result of her anxiety.  She is going to see Dr. Joan Mayans next week and continue work-up on her intermittent nausea.   Alycen Mack M Peighton Mehra, PA-C

## 2021-06-13 NOTE — Patient Instructions (Signed)
Good to see you again! Someone will call to set up DEXA scan and mammogram. Referral placed for GI locally as well. Try the Ambien for sleep - send me a message about this. If no relief, will try Klonopin next. Talk with Dr. Joan Mayans about your ongoing nausea.

## 2021-06-18 ENCOUNTER — Encounter: Payer: Self-pay | Admitting: Physician Assistant

## 2021-06-19 ENCOUNTER — Other Ambulatory Visit: Payer: Self-pay

## 2021-06-19 NOTE — Telephone Encounter (Signed)
Patient in requesting 30 day supply of Ambien 5 mg with refill. Reached out to [facility] regarding referral

## 2021-06-24 ENCOUNTER — Other Ambulatory Visit: Payer: Self-pay | Admitting: Physician Assistant

## 2021-06-24 MED ORDER — ZOLPIDEM TARTRATE 5 MG PO TABS: 5.0000 mg | ORAL_TABLET | Freq: Every evening | ORAL | 2 refills | Status: DC | PRN

## 2021-06-30 ENCOUNTER — Encounter: Payer: Self-pay | Admitting: Physician Assistant

## 2021-07-02 ENCOUNTER — Other Ambulatory Visit: Payer: Self-pay

## 2021-07-02 ENCOUNTER — Encounter: Payer: Self-pay | Admitting: Physician Assistant

## 2021-07-02 ENCOUNTER — Emergency Department (HOSPITAL_BASED_OUTPATIENT_CLINIC_OR_DEPARTMENT_OTHER)
Admission: EM | Admit: 2021-07-02 | Discharge: 2021-07-02 | Disposition: A | Discharge status: Home / Self Care | Payer: Medicare PPO | Attending: Emergency Medicine | Admitting: Emergency Medicine

## 2021-07-02 ENCOUNTER — Ambulatory Visit: Payer: Medicare PPO | Admitting: Physician Assistant

## 2021-07-02 ENCOUNTER — Emergency Department (HOSPITAL_BASED_OUTPATIENT_CLINIC_OR_DEPARTMENT_OTHER): Payer: Medicare PPO

## 2021-07-02 ENCOUNTER — Encounter (HOSPITAL_BASED_OUTPATIENT_CLINIC_OR_DEPARTMENT_OTHER): Payer: Self-pay | Admitting: *Deleted

## 2021-07-02 VITALS — BP 109/69 | HR 72 | Temp 98.6°F | Ht 66.0 in | Wt 181.4 lb

## 2021-07-02 DIAGNOSIS — K8020 Calculus of gallbladder without cholecystitis without obstruction: Secondary | ICD-10-CM | POA: Diagnosis not present

## 2021-07-02 DIAGNOSIS — R609 Edema, unspecified: Secondary | ICD-10-CM | POA: Diagnosis not present

## 2021-07-02 DIAGNOSIS — R1012 Left upper quadrant pain: Secondary | ICD-10-CM | POA: Diagnosis not present

## 2021-07-02 DIAGNOSIS — M7989 Other specified soft tissue disorders: Secondary | ICD-10-CM

## 2021-07-02 DIAGNOSIS — E7220 Disorder of urea cycle metabolism, unspecified: Secondary | ICD-10-CM | POA: Insufficient documentation

## 2021-07-02 DIAGNOSIS — R7989 Other specified abnormal findings of blood chemistry: Secondary | ICD-10-CM

## 2021-07-02 DIAGNOSIS — D7581 Myelofibrosis: Secondary | ICD-10-CM | POA: Diagnosis not present

## 2021-07-02 DIAGNOSIS — R600 Localized edema: Secondary | ICD-10-CM | POA: Insufficient documentation

## 2021-07-02 DIAGNOSIS — Z87891 Personal history of nicotine dependence: Secondary | ICD-10-CM | POA: Insufficient documentation

## 2021-07-02 DIAGNOSIS — D649 Anemia, unspecified: Secondary | ICD-10-CM

## 2021-07-02 DIAGNOSIS — R109 Unspecified abdominal pain: Secondary | ICD-10-CM | POA: Diagnosis not present

## 2021-07-02 DIAGNOSIS — R9431 Abnormal electrocardiogram [ECG] [EKG]: Secondary | ICD-10-CM | POA: Diagnosis not present

## 2021-07-02 LAB — URINALYSIS, ROUTINE W REFLEX MICROSCOPIC
Bilirubin Urine: NEGATIVE
Glucose, UA: NEGATIVE mg/dL
Hgb urine dipstick: NEGATIVE
Ketones, ur: NEGATIVE mg/dL
Leukocytes,Ua: NEGATIVE
Nitrite: NEGATIVE
Protein, ur: NEGATIVE mg/dL
Specific Gravity, Urine: 1.011 (ref 1.005–1.030)
pH: 7 (ref 5.0–8.0)

## 2021-07-02 LAB — CBC WITH DIFFERENTIAL/PLATELET
Abs Immature Granulocytes: 0.03 10*3/uL (ref 0.00–0.07)
Basophils Absolute: 0.1 10*3/uL (ref 0.0–0.1)
Basophils Relative: 2 %
Eosinophils Absolute: 0.1 10*3/uL (ref 0.0–0.5)
Eosinophils Relative: 3 %
HCT: 27.9 % — ABNORMAL LOW (ref 36.0–46.0)
Hemoglobin: 8.8 g/dL — ABNORMAL LOW (ref 12.0–15.0)
Immature Granulocytes: 1 %
Lymphocytes Relative: 22 %
Lymphs Abs: 1 10*3/uL (ref 0.7–4.0)
MCH: 28.6 pg (ref 26.0–34.0)
MCHC: 31.5 g/dL (ref 30.0–36.0)
MCV: 90.6 fL (ref 80.0–100.0)
Monocytes Absolute: 0.3 10*3/uL (ref 0.1–1.0)
Monocytes Relative: 6 %
Neutro Abs: 3.1 10*3/uL (ref 1.7–7.7)
Neutrophils Relative %: 66 %
Platelets: 174 10*3/uL (ref 150–400)
RBC: 3.08 MIL/uL — ABNORMAL LOW (ref 3.87–5.11)
RDW: 24.6 % — ABNORMAL HIGH (ref 11.5–15.5)
WBC: 4.7 10*3/uL (ref 4.0–10.5)
nRBC: 0.4 % — ABNORMAL HIGH (ref 0.0–0.2)

## 2021-07-02 LAB — COMPREHENSIVE METABOLIC PANEL
ALT: 19 U/L (ref 0–44)
AST: 31 U/L (ref 15–41)
Albumin: 3.3 g/dL — ABNORMAL LOW (ref 3.5–5.0)
Alkaline Phosphatase: 77 U/L (ref 38–126)
Anion gap: 7 (ref 5–15)
BUN: 13 mg/dL (ref 8–23)
CO2: 24 mmol/L (ref 22–32)
Calcium: 8.3 mg/dL — ABNORMAL LOW (ref 8.9–10.3)
Chloride: 111 mmol/L (ref 98–111)
Creatinine, Ser: 0.64 mg/dL (ref 0.44–1.00)
GFR, Estimated: >60 mL/min (ref >60)
Glucose, Bld: 84 mg/dL (ref 70–99)
Potassium: 4 mmol/L (ref 3.5–5.1)
Sodium: 142 mmol/L (ref 135–145)
Total Bilirubin: 5.3 mg/dL — ABNORMAL HIGH (ref 0.3–1.2)
Total Protein: 4.9 g/dL — ABNORMAL LOW (ref 6.5–8.1)

## 2021-07-02 LAB — PROTIME-INR
INR: 1.8 — ABNORMAL HIGH (ref 0.8–1.2)
Prothrombin Time: 21 seconds — ABNORMAL HIGH (ref 11.4–15.2)

## 2021-07-02 LAB — BRAIN NATRIURETIC PEPTIDE: B Natriuretic Peptide: 226.9 pg/mL — ABNORMAL HIGH (ref 0.0–100.0)

## 2021-07-02 LAB — LIPASE, BLOOD: Lipase: <10 U/L — ABNORMAL LOW (ref 11–51)

## 2021-07-02 LAB — AMMONIA: Ammonia: 87 umol/L — ABNORMAL HIGH (ref 9–35)

## 2021-07-02 MED ORDER — FUROSEMIDE 20 MG PO TABS: 20.0000 mg | ORAL_TABLET | Freq: Every day | ORAL | 0 refills | Status: DC

## 2021-07-02 MED ORDER — IOHEXOL 350 MG/ML SOLN
75.0000 mL | Freq: Once | INTRAVENOUS | Status: AC | PRN
  Administered 2021-07-02: 75 mL via INTRAVENOUS

## 2021-07-02 NOTE — ED Triage Notes (Signed)
Bilateral Lower extremity edema for 2 weeks.  Pt gained 8 lbs in 2 weeks.  Was sent by her PCP to be checked.

## 2021-07-02 NOTE — ED Notes (Signed)
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

## 2021-07-02 NOTE — Patient Instructions (Signed)
I am worried about possible bilateral DVT. Go straight to ED for evaluation now.

## 2021-07-02 NOTE — Discharge Instructions (Addendum)
Your work-up today ruled out blood clot as a cause of the lower extremity swelling.  The blood work did show some other findings including anemia, elevated ammonia, and elevated BNP which is likely reflective of the swelling in her legs.  The CT scan did not show a compressive problem but did show some gallstones.  Due to your otherwise reassuring vital signs and normal kidney function, we agreed it was reasonable to have you take a diuretic for the next few days and see your primary team.  If any symptoms change or worsen acutely, please return to the nearest emergency department.

## 2021-07-02 NOTE — Progress Notes (Signed)
Acute Office Visit  Subjective:    Patient ID: Courtney Buckley, female    DOB: 11-Feb-1951, 70 y.o.   MRN: [id number]  Chief Complaint  Patient presents with   Foot Swelling    HPI Patient is in today for bilateral foot swelling x 2 week, L > R. She is trying to drink a lot of water. Usually worse in the mornings. No CP or SOB. No orthopnea. Says she feels bloated, but denies any pain. No falls or surgeries. She has myelofibrosis and is currently on [place]. Family hx of DVT. No personal hx of DVT. No personal hx CHF.   Wt Readings from Last 3 Encounters:  07/02/21 181 lb 6.4 oz (82.3 kg)  06/13/21 173 lb 3.2 oz (78.6 kg)  01/27/21 163 lb 3.2 oz (74 kg)     Past Medical History:  Diagnosis Date   Cirrhosis ([facility])    Colon polyps    Depression    Esophageal varices ([facility])    Gastritis    Internal hemorrhoids    Migraine    Portal hypertensive gastropathy (HCC)    PVT (portal vein thrombosis)     Past Surgical History:  Procedure Laterality Date   LIVER BIOPSY      Family History  Problem Relation Age of Onset   Cancer - Lung Father    Diabetes Mellitus II Maternal Aunt    Diabetes Mellitus II Maternal Uncle    Stomach cancer Maternal Aunt    Colon cancer Neg Hx    Rectal cancer Neg Hx    Esophageal cancer Neg Hx    Liver disease Neg Hx    Pancreatic cancer Neg Hx     Social History   Socioeconomic History   Marital status: Significant Other    Spouse name: Not on file   Number of children: Not on file   Years of education: Not on file   Highest education level: Not on file  Occupational History   Not on file  Tobacco Use   Smoking status: Former    Types: Cigarettes   Smokeless tobacco: Never  Vaping Use   Vaping Use: Never used  Substance and Sexual Activity   Alcohol use: Not Currently    Comment: Occasionally-1 acoholic beverage every 1-2 weeks   Drug use: No   Sexual activity: Not on file  Other Topics Concern   Not on file  Social  History Narrative   Not on file   Social Determinants of Health   Financial Resource Strain: Low Risk    Difficulty of Paying Living Expenses: Not hard at all  Food Insecurity: No Food Insecurity   Worried About Charity fundraiser in the Last Year: Never true   Arboriculturist in the Last Year: Never true  Transportation Needs: No Transportation Needs   Lack of Transportation (Medical): No   Lack of Transportation (Non-Medical): No  Physical Activity: Insufficiently Active   Days of Exercise per Week: 3 days   Minutes of Exercise per Session: 20 min  Stress: No Stress Concern Present   Feeling of Stress : Only a little  Social Connections: Moderately Isolated   Frequency of Communication with Friends and Family: More than three times a week   Frequency of Social Gatherings with Friends and Family: More than three times a week   Attends Religious Services: Never   Marine scientist or Organizations: No   Attends Archivist Meetings: Never   Marital Status:  Married  Human resources officer Violence: Not At Risk   Fear of Current or Ex-Partner: No   Emotionally Abused: No   Physically Abused: No   Sexually Abused: No    Outpatient Medications Prior to Visit  Medication Sig Dispense Refill   Multiple Vitamin (MULTIVITAMIN ADULT PO) Take by mouth.     ruxolitinib phosphate (JAKAFI) 20 MG tablet TAKE 1 TABLET (20 MG TOTAL) BY MOUTH 2 (TWO) TIMES DAILY. 60 tablet 0   zolpidem (AMBIEN) 5 MG tablet Take 1 tablet (5 mg total) by mouth at bedtime as needed for sleep. 30 tablet 2   No facility-administered medications prior to visit.    No Known Allergies  Review of Systems REFER TO HPI FOR PERTINENT POSITIVES AND NEGATIVES     Objective:    Physical Exam Vitals and nursing note reviewed.  Constitutional:      Appearance: Normal appearance. She is normal weight. She is not toxic-appearing.  HENT:     Head: Normocephalic and atraumatic.     Right Ear: Tympanic  membrane, ear canal and external ear normal.     Left Ear: Tympanic membrane, ear canal and external ear normal.     Nose: Nose normal.     Mouth/Throat:     Mouth: Mucous membranes are moist.  Eyes:     Extraocular Movements: Extraocular movements intact.     Conjunctiva/sclera: Conjunctivae normal.     Pupils: Pupils are equal, round, and reactive to light.  Cardiovascular:     Rate and Rhythm: Normal rate and regular rhythm.     Pulses: Normal pulses.     Heart sounds: Murmur (previously known murmur) heard.  Pulmonary:     Effort: Pulmonary effort is normal.     Breath sounds: Normal breath sounds.  Abdominal:     General: Abdomen is flat. Bowel sounds are normal.     Palpations: Abdomen is soft.  Musculoskeletal:        General: Normal range of motion.     Cervical back: Normal range of motion and neck supple.     Right lower leg: Edema (3+ pitting; Homan's sign negative) present.     Left lower leg: Edema (3+ pitting; Homan's sign negative) present.  Skin:    General: Skin is warm and dry.  Neurological:     General: No focal deficit present.     Mental Status: She is alert and oriented to person, place, and time.  Psychiatric:        Mood and Affect: Mood normal.        Behavior: Behavior normal.        Thought Content: Thought content normal.        Judgment: Judgment normal.    Ht '5\' 6"'$  (1.676 m)   BMI 27.96 kg/m  Wt Readings from Last 3 Encounters:  06/13/21 173 lb 3.2 oz (78.6 kg)  01/27/21 163 lb 3.2 oz (74 kg)  01/06/21 178 lb 3.2 oz (80.8 kg)    Health Maintenance Due  Topic Date Due   TETANUS/TDAP  Never done   DEXA SCAN  Never done   PNA vac Low Risk Adult (1 of 2 - PCV13) Never done   MAMMOGRAM  01/28/2018   INFLUENZA VACCINE  06/02/2021    There are no preventive care reminders to display for this patient.   Lab Results  Component Value Date   TSH 2.20 04/03/2020   Lab Results  Component Value Date   WBC 11.9 (H) 10/04/2020  HGB 13.2  10/04/2020   HCT 40.9 10/04/2020   MCV 84.9 10/04/2020   PLT 360 10/04/2020   Lab Results  Component Value Date   NA 142 09/13/2020   K 3.6 09/13/2020   CO2 25 09/13/2020   GLUCOSE 110 (H) 09/13/2020   BUN 10 09/13/2020   CREATININE 0.61 09/13/2020   BILITOT 3.3 (H) 09/13/2020   ALKPHOS 117 09/13/2020   AST 34 09/13/2020   ALT 15 09/13/2020   PROT 4.6 (L) 09/13/2020   ALBUMIN 2.9 (L) 09/13/2020   CALCIUM 8.1 (L) 09/13/2020   ANIONGAP 5 09/13/2020   GFR 90.00 05/31/2020   No results found for: CHOL No results found for: HDL No results found for: LDLCALC No results found for: TRIG No results found for: CHOLHDL No results found for: HGBA1C     Assessment & Plan:   Problem List Items Addressed This Visit   None  1. Myelofibrosis ([place]) 2. Acute edema Modified Wells Score of 3. Discussed with patient although, rare, she may have bilateral DVT. Advised ED now for work-up. She is agreeable and will go straight to [facility] on [address].    Lucas Winograd M Chrishana Spargur, PA-C

## 2021-07-02 NOTE — ED Provider Notes (Signed)
[facility] Provider Note   CSN: [id number] Arrival date & time: 07/02/21  1341     History Chief Complaint  Patient presents with   Leg Swelling    Courtney Buckley is a 70 y.o. female.  The history is provided by the patient and medical records. No language interpreter was used.  Illness Location:  Bilateral lower extremity edema Severity:  Moderate Onset quality:  Gradual Duration:  2 weeks Timing:  Constant Progression:  Worsening Chronicity:  New Context:  Pulse of abdominal discomfort Associated symptoms: abdominal pain and diarrhea   Associated symptoms: no chest pain, no congestion, no cough, no fatigue (no increase from he rbaseline), no fever, no headaches, no loss of consciousness, no nausea, no rhinorrhea, no shortness of breath, no vomiting and no wheezing       Past Medical History:  Diagnosis Date   Cirrhosis ([place])    Colon polyps    Depression    Esophageal varices ([facility])    Gastritis    Internal hemorrhoids    Migraine    Portal hypertensive gastropathy ([facility])    PVT (portal vein thrombosis)     Patient Active Problem List   Diagnosis Date Noted   Myelofibrosis ([place]) 10/04/2020    Past Surgical History:  Procedure Laterality Date   LIVER BIOPSY       OB History     Gravida  2   Para  2   Term      Preterm      AB      Living         SAB      IAB      Ectopic      Multiple      Live Births              Family History  Problem Relation Age of Onset   Cancer - Lung Father    Diabetes Mellitus II Maternal Aunt    Stomach cancer Maternal Aunt    Diabetes Mellitus II Maternal Uncle    Colon cancer Neg Hx    Rectal cancer Neg Hx    Esophageal cancer Neg Hx    Liver disease Neg Hx    Pancreatic cancer Neg Hx     Social History   Tobacco Use   Smoking status: Former    Types: Cigarettes   Smokeless tobacco: Never  Vaping Use   Vaping Use: Never used  Substance Use Topics    Alcohol use: Not Currently    Comment: Occasionally-1 acoholic beverage every 1-2 weeks   Drug use: No    Home Medications Prior to Admission medications   Medication Sig Start Date End Date Taking? Authorizing Provider  JAKAFI 10 MG tablet Take 10 mg by mouth 2 (two) times daily. 06/11/21  Yes [provider]  Multiple Vitamin (MULTIVITAMIN ADULT PO) Take by mouth.   Yes [provider]  zolpidem (AMBIEN) 5 MG tablet Take 1 tablet (5 mg total) by mouth at bedtime as needed for sleep. 06/28/21 07/28/21 Yes Allwardt, Randa Evens, PA-C    Allergies    Patient has no known allergies.  Review of Systems   Review of Systems  Constitutional:  Negative for chills, diaphoresis, fatigue (no increase from he rbaseline) and fever.  HENT:  Negative for congestion and rhinorrhea.   Respiratory:  Negative for cough, shortness of breath and wheezing.   Cardiovascular:  Positive for leg swelling. Negative for chest pain and palpitations.  Gastrointestinal:  Positive for abdominal pain and diarrhea. Negative for abdominal distention, constipation, nausea and vomiting.  Genitourinary:  Negative for dysuria and flank pain.  Musculoskeletal:  Negative for back pain, neck pain and neck stiffness.  Neurological:  Negative for loss of consciousness and headaches.  Psychiatric/Behavioral:  Negative for agitation.   All other systems reviewed and are negative.  Physical Exam Updated Vital Signs BP (!) 148/62 (BP Location: Right Arm)   Pulse 72   Temp 98.2 F (36.8 C)   Resp 16   Ht '5\' 6"'$  (1.676 m)   Wt 81.8 kg   SpO2 97%   BMI 29.12 kg/m   Physical Exam Vitals and nursing note reviewed.  Constitutional:      General: She is not in acute distress.    Appearance: She is well-developed. She is not ill-appearing, toxic-appearing or diaphoretic.  HENT:     Head: Normocephalic and atraumatic.     Mouth/Throat:     Mouth: Mucous membranes are moist.     Pharynx: No oropharyngeal  exudate or posterior oropharyngeal erythema.  Eyes:     Pupils: Pupils are equal, round, and reactive to light.  Cardiovascular:     Rate and Rhythm: Normal rate and regular rhythm.     Heart sounds: Murmur heard.  Pulmonary:     Effort: Pulmonary effort is normal. No respiratory distress.     Breath sounds: Normal breath sounds. No wheezing, rhonchi or rales.  Chest:     Chest wall: No tenderness.  Abdominal:     General: Abdomen is flat. Bowel sounds are normal. There is no distension.     Palpations: Abdomen is soft.     Tenderness: There is abdominal tenderness. There is no right CVA tenderness, left CVA tenderness, guarding or rebound.  Musculoskeletal:     Cervical back: Neck supple. No tenderness.     Right lower leg: Edema present.     Left lower leg: Edema present.  Skin:    General: Skin is warm and dry.     Coloration: Skin is jaundiced (possibly faintly) and pale.     Findings: No bruising, erythema or rash.  Neurological:     General: No focal deficit present.     Mental Status: She is alert.  Psychiatric:        Mood and Affect: Mood normal.    ED Results / Procedures / Treatments   Labs (all labs ordered are listed, but only abnormal results are displayed) Labs Reviewed  CBC WITH DIFFERENTIAL/PLATELET - Abnormal; Notable for the following components:      Result Value   RBC 3.08 (*)    Hemoglobin 8.8 (*)    HCT 27.9 (*)    RDW 24.6 (*)    nRBC 0.4 (*)    All other components within normal limits  COMPREHENSIVE METABOLIC PANEL - Abnormal; Notable for the following components:   Calcium 8.3 (*)    Total Protein 4.9 (*)    Albumin 3.3 (*)    Total Bilirubin 5.3 (*)    All other components within normal limits  PROTIME-INR - Abnormal; Notable for the following components:   Prothrombin Time 21.0 (*)    INR 1.8 (*)    All other components within normal limits  BRAIN NATRIURETIC PEPTIDE - Abnormal; Notable for the following components:   B Natriuretic  Peptide 226.9 (*)    All other components within normal limits  LIPASE, BLOOD - Abnormal; Notable for the following components:  Lipase <10 (*)    All other components within normal limits  AMMONIA - Abnormal; Notable for the following components:   Ammonia 87 (*)    All other components within normal limits  URINE CULTURE  URINALYSIS, ROUTINE W REFLEX MICROSCOPIC    EKG EKG Interpretation  Date/Time:  Wednesday July 02 2021 16:15:18 EDT Ventricular Rate:  67 PR Interval:  181 QRS Duration: 111 QT Interval:  441 QTC Calculation: 466 R Axis:   -11 Text Interpretation: Sinus rhythm Atrial premature complex Left ventricular hypertrophy No prior ECG for comparison. No STEMI Confirmed by Antony Blackbird [phone]) on 07/02/2021 4:39:39 PM  Radiology CT ABDOMEN PELVIS W CONTRAST  Result Date: 07/02/2021 CLINICAL DATA:  nonlocalized Left-sided abdominal pain, history of myelofibrosis with spleen problems in the past, new edema in the legs concerning for some mechanical obstruction on her IVC versus other intra-abdominal abscess. Cirrhosis. EXAM: CT ABDOMEN AND PELVIS WITH CONTRAST TECHNIQUE: Multidetector CT imaging of the abdomen and pelvis was performed using the standard protocol following bolus administration of intravenous contrast. CONTRAST:  27m OMNIPAQUE IOHEXOL 350 MG/ML SOLN COMPARISON:  MR abdomen 10/03/2020. FINDINGS: Lower chest: No acute abnormality. Hepatobiliary: Redemonstration of a 1.3 cm right hepatic lobe hypodense lesion which is consistent with a known hemangioma better evaluated on MR abdomen 10/03/2020. Hyperdensity within the gallbladder lumen likely represents gallstones. No gallbladder wall thickening or pericholecystic fluid. No biliary dilatation. Pancreas: No focal lesion. Normal pancreatic contour. No surrounding inflammatory changes. No main pancreatic ductal dilatation. Spleen: Spleen is enlarged measuring up to 17 cm. No focal splenic lesion. Adrenals/Urinary  Tract: No adrenal nodule bilaterally. Bilateral kidneys enhance symmetrically. No hydronephrosis. No hydroureter. The urinary bladder is unremarkable. On delayed imaging, there is no urothelial wall thickening and there are no filling defects in the opacified portions of the bilateral collecting systems or ureters. Stomach/Bowel: Stomach is within normal limits. No evidence of small bowel wall thickening or dilatation. Diffuse bowel wall thickening and hypodense edema of the ascending colon. The appendix is not definitely identified. Vascular/Lymphatic: Nonvisualized main portal vein consistent with known chronic thrombus with associated cavernous transformation of the portal vein. Venous collaterals. Large splenorenal shunt. No abdominal aorta or iliac aneurysm. Mild atherosclerotic plaque of the aorta and its branches. No abdominal, pelvic, or inguinal lymphadenopathy. Reproductive: Uterus and bilateral adnexa are unremarkable. Other: Trace volume simple free fluid. No intraperitoneal free gas. No organized fluid collection. Musculoskeletal: No abdominal wall hernia or abnormality. No suspicious lytic or blastic osseous lesions. No acute displaced fracture. Multilevel degenerative changes of the spine. IMPRESSION: 1. Cirrhosis with portal hypertension. 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 2. Trace volume simple free fluid. 3. Hyperdensity within the gallbladder lumen likely represents gallstones or gallbladder sludge. Consider nonemergent right upper quadrant ultrasound for further evaluation. Electronically Signed   By: MIven FinnM.D.   On: 07/02/2021 20:26   [place] Img Lower Bilateral (DVT)  Result Date: 07/02/2021 CLINICAL DATA:  Lower extremity edema. EXAM: Bilateral LOWER EXTREMITY VENOUS DOPPLER ULTRASOUND TECHNIQUE: Gray-scale sonography with compression, as well as color and duplex ultrasound, were performed to evaluate  the deep venous system(s) from the level of the common femoral vein through the popliteal and proximal calf veins. COMPARISON:  None. FINDINGS:  PLEASE NOTE THAT THE NAME ON THE ULTRASOUND IMAGES IS INCORRECT. THESE IMAGES OBTAINED ARE ON Tz[facility] LOWRY-BOYLAN. THIS HAS BEEN CONFIRMED BY THE SONOGRAPHER, BUT THERE IS NO WAY TO CHANGE THE BURNED IN  NAME ON THE IMAGES.  VENOUS Normal compressibility of the common femoral, superficial femoral, and popliteal veins, as well as the visualized calf veins. Visualized portions of profunda femoral vein and great saphenous vein unremarkable. No filling defects to suggest DVT on grayscale or color Doppler imaging. Doppler waveforms show normal direction of venous flow, normal respiratory plasticity and response to augmentation. Limited views of the contralateral common femoral vein are unremarkable. OTHER None. Limitations: none IMPRESSION: 1. No evidence of lower extremity DVT on either side. Electronically Signed   By: Van Clines M.D.   On: 07/02/2021 17:59    Procedures Procedures   Medications Ordered in ED Medications  iohexol (OMNIPAQUE) 350 MG/ML injection 75 mL (75 mLs Intravenous Contrast Given 07/02/21 2010)    ED Course  I have reviewed the triage vital signs and the nursing notes.  Pertinent labs & imaging results that were available during my care of the patient were reviewed by me and considered in my medical decision making (see chart for details).    MDM Rules/Calculators/A&P                           Rylynn Minniear is a 70 y.o. female with a past medical history significant for myelofibrosis, cirrhosis, previous chronic portal vein thrombosis, esophageal varices, migraines, and depression who presents at the direction of her PCP for DVT rule out and evaluation of bilateral leg edema and several days of abdominal discomfort.  Patient reports that for the last 2 weeks, she has had swelling in both her legs.  She denies any pain in  her legs and denies any history of DVT.  She says that  she does not seem to be having swelling in her abdomen or her arms but family thought that she was looking slightly more yellow than baseline.  She says that for the last 3 to 4 days she has been having a stitch like pain in her left upper quadrant and abdomen that is new.  She reports that her medications caused her spleen to swell and decrease in size and she thinks that could be what is going on.  She reports otherwise no fevers, chills, congestion, cough, chest pain, shortness of breath, palpitations, nausea, vomiting, or constipation.  She does report some diarrhea.  She also reports she has no urinary symptoms but urinates frequently at her baseline.  She denies history of heart failure but does report she thinks she is gained 8 pounds in 2 weeks.  On exam, lungs are clear and chest is nontender.  She does have a murmur.  Abdomen is slightly tender in the left upper quadrant but normal bowel sounds appreciated.  Legs are edematous bilaterally without tenderness.  Intact DP and PT pulse.  Intact sensation and strength.  No other tenderness on exam.  Clinically I do feel need to rule out DVTs given the PCP concern and her history of some clotting and her myelofibrosis.  With her abdominal discomfort, we will get some screening labs however anticipate we may need to do a CT scan to further evaluate to make sure if there is no DVT discovered, she does not have some compressive etiology on her IVC causing her bilateral lower extremity swelling as well as her abdominal discomfort.  Anticipate reassessment after work-up.  7:35 PM Patient's work-up began to return for ammonia is elevated at 87 but she is denying any altered mental status.  INR is up to 1.8 from  1.6.  BNP is elevated to 26 but there is no prior for comparison.  Kidney function is normal as are other electrolytes similar to prior.  Bilirubin is rising up to 5.3.  Urinalysis does not show  infection.  No leukocytosis.  Lipase not elevated.  DVT ultrasound negative.  Clinically I am still concerned about the abdominal pain, and the edema in the legs which now that the ultrasounds are negative, it is likely either due to new heart failure, fluid overload versus some obstructive abnormality in her abdomen with her discomfort.  I am also concerned about the rising ammonia, INR, BNP, and dropping hemoglobin.  Unclear etiology at this time as to this constellation of findings.  Will get CT abdomen pelvis to look for compressive pathology and will have a shared decision-making conversation with patient about disposition.    CT scan showed gallstones but otherwise did not show any critical abnormalities.  We discussed outpatient ultrasound for follow-up.  Had a shared decision-making conversation with patient about some of the concerning findings including the anemia, elevated ammonia, elevated bilirubin, elevated BNP, and her symptoms however her vital signs were stable and reassuring and she would like to go home.  She will call her doctor for close follow-up as well as repeat blood work and if any symptoms change or worsen, she understands return for likely admission and further management.  She asked for a diuretic to help with the edema and given her normal kidney function, we agreed to give her a low-dose of Lasix which she reports she has tolerated in the past previously.  She will also talk to her primary doctor about adjusting this medication if needed.  Patient agreeable plan of care and was discharged in good condition.  Final Clinical Impression(s) / ED Diagnoses Final diagnoses:  Edema, unspecified type  Left upper quadrant abdominal pain  Anemia, unspecified type  Elevated brain natriuretic peptide (BNP) level  Gallstones  Increased ammonia level    Rx / DC Orders ED Discharge Orders          Ordered    furosemide (LASIX) 20 MG tablet  Daily        07/02/21 2300             Clinical Impression: 1. Edema, unspecified type   2. Leg swelling   3. Left upper quadrant abdominal pain   4. Anemia, unspecified type   5. Elevated brain natriuretic peptide (BNP) level   6. Gallstones   7. Increased ammonia level     Disposition: Discharge  Condition: Good  I have discussed the results, Dx and Tx plan with the pt(& family if present). He/she/they expressed understanding and agree(s) with the plan. Discharge instructions discussed at great length. Strict return precautions discussed and pt &/or family have verbalized understanding of the instructions. No further questions at time of discharge.    Discharge Medication List as of 07/02/2021 11:02 PM     START taking these medications   Details  furosemide (LASIX) 20 MG tablet Take 1 tablet (20 mg total) by mouth daily., Starting Wed 07/02/2021, Print        Follow Up: Allwardt, Randa Evens, PA-C [place] [address] [facility] [place] [phone]       Tanekia Ryans, Gwenyth Allegra, MD 07/03/21 [phone]

## 2021-07-04 ENCOUNTER — Encounter: Payer: Self-pay | Admitting: Physician Assistant

## 2021-07-04 LAB — URINE CULTURE: Culture: <10000 — AB

## 2021-07-10 DIAGNOSIS — I81 Portal vein thrombosis: Secondary | ICD-10-CM | POA: Diagnosis not present

## 2021-07-10 DIAGNOSIS — F329 Major depressive disorder, single episode, unspecified: Secondary | ICD-10-CM | POA: Diagnosis not present

## 2021-07-10 DIAGNOSIS — R140 Abdominal distension (gaseous): Secondary | ICD-10-CM | POA: Diagnosis not present

## 2021-07-10 DIAGNOSIS — K7460 Unspecified cirrhosis of liver: Secondary | ICD-10-CM | POA: Diagnosis not present

## 2021-07-10 DIAGNOSIS — D7581 Myelofibrosis: Secondary | ICD-10-CM | POA: Diagnosis not present

## 2021-07-10 DIAGNOSIS — R600 Localized edema: Secondary | ICD-10-CM | POA: Diagnosis not present

## 2021-07-10 DIAGNOSIS — D471 Chronic myeloproliferative disease: Secondary | ICD-10-CM | POA: Diagnosis not present

## 2021-07-10 DIAGNOSIS — L299 Pruritus, unspecified: Secondary | ICD-10-CM | POA: Diagnosis not present

## 2021-07-10 DIAGNOSIS — R161 Splenomegaly, not elsewhere classified: Secondary | ICD-10-CM | POA: Diagnosis not present

## 2021-07-11 ENCOUNTER — Other Ambulatory Visit: Payer: Self-pay

## 2021-07-11 MED ORDER — HYDROXYZINE HCL 25 MG PO TABS: 25.0000 mg | ORAL_TABLET | Freq: Every evening | ORAL | 1 refills | Status: DC

## 2021-07-14 ENCOUNTER — Encounter: Payer: Self-pay | Admitting: Physician Assistant

## 2021-07-14 ENCOUNTER — Ambulatory Visit: Payer: Medicare PPO | Admitting: Physician Assistant

## 2021-07-14 ENCOUNTER — Other Ambulatory Visit: Payer: Self-pay

## 2021-07-14 VITALS — BP 112/66 | HR 70 | Temp 98.2°F | Ht 66.0 in | Wt 180.2 lb

## 2021-07-14 DIAGNOSIS — F5101 Primary insomnia: Secondary | ICD-10-CM

## 2021-07-14 DIAGNOSIS — K7469 Other cirrhosis of liver: Secondary | ICD-10-CM

## 2021-07-14 DIAGNOSIS — R7989 Other specified abnormal findings of blood chemistry: Secondary | ICD-10-CM | POA: Diagnosis not present

## 2021-07-14 DIAGNOSIS — R600 Localized edema: Secondary | ICD-10-CM

## 2021-07-14 DIAGNOSIS — K7460 Unspecified cirrhosis of liver: Secondary | ICD-10-CM

## 2021-07-14 DIAGNOSIS — D7581 Myelofibrosis: Secondary | ICD-10-CM | POA: Diagnosis not present

## 2021-07-14 DIAGNOSIS — R609 Edema, unspecified: Secondary | ICD-10-CM

## 2021-07-14 DIAGNOSIS — R269 Unspecified abnormalities of gait and mobility: Secondary | ICD-10-CM | POA: Diagnosis not present

## 2021-07-14 LAB — AMMONIA: Ammonia: 75 umol/L — ABNORMAL HIGH (ref 11–35)

## 2021-07-14 MED ORDER — ZOLPIDEM TARTRATE 5 MG PO TABS: 5.0000 mg | ORAL_TABLET | Freq: Every evening | ORAL | 2 refills | Status: DC | PRN

## 2021-07-14 MED ORDER — FUROSEMIDE 20 MG PO TABS: 20.0000 mg | ORAL_TABLET | Freq: Two times a day (BID) | ORAL | 0 refills | Status: DC

## 2021-07-14 NOTE — Patient Instructions (Addendum)
I will reach out to Dr. Joan Mayans about your balance issues and ECHO report. Recheck ammonia level today. Ambien refilled to fill on 07/28/21. Referrals to hepatology and PT. Rx for Lasix 20 mg twice daily, take with potassium  F/up in 3 months with me

## 2021-07-14 NOTE — Progress Notes (Signed)
Established Patient Office Visit  Subjective:  Patient ID: Courtney Buckley, female    DOB: Jun 21, 1951  Age: 70 y.o. MRN: [id number]  CC:  Chief Complaint  Patient presents with   Medication Refill   Referral    HPI Courtney Buckley presents for emergency department follow-up on 07/02/2021.  I sent her that day from our office to the emergency department for acute bilateral lower extremity edema and abdominal pain.  She had Doppler ultrasound done of bilateral lower extremities, which was negative for DVT.  She had a CT of her abdomen done which showed gallstones but no other critical abnormalities.  Outpatient ultrasound for follow-up was discussed with the patient.  She did have elevated ammonia level, elevated bilirubin, elevated BNP, and anemia on her labs, but her vital signs were stable and reassuring. She was discharged on Lasix and told to follow-up with [place] in good condition.  Still having edema, especially in left ankle, otherwise states she feels good. She saw her oncologist last Thursday and was encouraged to stay on the Lasix. She also encouraged cardiology and hepatology referrals. PT referral also advised for her equilibrium issues, says she has been staggering to the right occasionally and she is not sure when this really started.  She fell at the park the other day because of being off balance.  Denies any weakness on side, headaches, dizziness.  She also denies any chest pain or shortness of breath.  Past Medical History:  Diagnosis Date   Cirrhosis ([place])    Colon polyps    Depression    Esophageal varices (HCC)    Gastritis    Internal hemorrhoids    Migraine    Portal hypertensive gastropathy (HCC)    PVT (portal vein thrombosis)     Past Surgical History:  Procedure Laterality Date   LIVER BIOPSY      Family History  Problem Relation Age of Onset   Cancer - Lung Father    Diabetes Mellitus II Maternal Aunt    Stomach cancer Maternal Aunt    Diabetes  Mellitus II Maternal Uncle    Colon cancer Neg Hx    Rectal cancer Neg Hx    Esophageal cancer Neg Hx    Liver disease Neg Hx    Pancreatic cancer Neg Hx     Social History   Socioeconomic History   Marital status: Significant Other    Spouse name: Not on file   Number of children: Not on file   Years of education: Not on file   Highest education level: Not on file  Occupational History   Not on file  Tobacco Use   Smoking status: Former    Types: Cigarettes   Smokeless tobacco: Never  Vaping Use   Vaping Use: Never used  Substance and Sexual Activity   Alcohol use: Not Currently    Comment: Occasionally-1 acoholic beverage every 1-2 weeks   Drug use: No   Sexual activity: Not on file  Other Topics Concern   Not on file  Social History Narrative   Not on file   Social Determinants of Health   Financial Resource Strain: Low Risk    Difficulty of Paying Living Expenses: Not hard at all  Food Insecurity: No Food Insecurity   Worried About Charity fundraiser in the Last Year: Never true   Ran Out of Food in the Last Year: Never true  Transportation Needs: No Transportation Needs   Lack of Transportation (Medical): No  Lack of Transportation (Non-Medical): No  Physical Activity: Insufficiently Active   Days of Exercise per Week: 3 days   Minutes of Exercise per Session: 20 min  Stress: No Stress Concern Present   Feeling of Stress : Only a little  Social Connections: Moderately Isolated   Frequency of Communication with Friends and Family: More than three times a week   Frequency of Social Gatherings with Friends and Family: More than three times a week   Attends Religious Services: Never   Marine scientist or Organizations: No   Attends Music therapist: Never   Marital Status: Married  Human resources officer Violence: Not At Risk   Fear of Current or Ex-Partner: No   Emotionally Abused: No   Physically Abused: No   Sexually Abused: No     Outpatient Medications Prior to Visit  Medication Sig Dispense Refill   furosemide (LASIX) 20 MG tablet Take 1 tablet (20 mg total) by mouth daily. 14 tablet 0   hydrOXYzine (ATARAX/VISTARIL) 25 MG tablet Take 1 tablet (25 mg total) by mouth at bedtime. 90 tablet 1   JAKAFI 10 MG tablet Take 10 mg by mouth 2 (two) times daily.     Multiple Vitamin (MULTIVITAMIN ADULT PO) Take by mouth.     zolpidem (AMBIEN) 5 MG tablet Take 1 tablet (5 mg total) by mouth at bedtime as needed for sleep. 30 tablet 2   No facility-administered medications prior to visit.    No Known Allergies  ROS Review of Systems REFER TO HPI FOR PERTINENT POSITIVES AND NEGATIVES    Objective:    Physical Exam Vitals and nursing note reviewed.  Constitutional:      General: She is not in acute distress.    Appearance: Normal appearance. She is normal weight. She is not toxic-appearing.  HENT:     Head: Normocephalic and atraumatic.     Right Ear: Tympanic membrane, ear canal and external ear normal.     Left Ear: Tympanic membrane, ear canal and external ear normal.     Nose: Nose normal.     Mouth/Throat:     Mouth: Mucous membranes are moist.  Eyes:     Extraocular Movements: Extraocular movements intact.     Conjunctiva/sclera: Conjunctivae normal.     Pupils: Pupils are equal, round, and reactive to light.  Cardiovascular:     Rate and Rhythm: Normal rate and regular rhythm.     Pulses: Normal pulses.     Heart sounds: No murmur heard. Pulmonary:     Effort: Pulmonary effort is normal.     Breath sounds: Normal breath sounds.  Abdominal:     General: Abdomen is flat. Bowel sounds are normal. There is no distension.     Palpations: Abdomen is soft. There is no mass.     Tenderness: There is no abdominal tenderness.  Musculoskeletal:        General: Normal range of motion.     Cervical back: Normal range of motion and neck supple.     Right lower leg: Edema (1+ pitting; Homan's sign negative)  present.     Left lower leg: Edema (1+ pitting; Homan's sign negative) present.  Skin:    General: Skin is warm and dry.  Neurological:     Mental Status: She is alert and oriented to person, place, and time.     Cranial Nerves: Cranial nerves are intact.     Motor: No weakness or abnormal muscle tone.  Coordination: Romberg sign positive.     Gait: Tandem walk abnormal.  Psychiatric:        Mood and Affect: Mood normal.        Behavior: Behavior normal.        Thought Content: Thought content normal.        Judgment: Judgment normal.    BP 112/66   Pulse 70   Temp 98.2 F (36.8 C)   Ht '5\' 6"'$  (1.676 m)   Wt 180 lb 3.2 oz (81.7 kg)   SpO2 99%   BMI 29.09 kg/m  Wt Readings from Last 3 Encounters:  07/14/21 180 lb 3.2 oz (81.7 kg)  07/02/21 180 lb 7.1 oz (81.8 kg)  07/02/21 181 lb 6.4 oz (82.3 kg)     Health Maintenance Due  Topic Date Due   TETANUS/TDAP  Never done   DEXA SCAN  Never done   PNA vac Low Risk Adult (1 of 2 - PCV13) Never done   MAMMOGRAM  01/28/2018   INFLUENZA VACCINE  06/02/2021    There are no preventive care reminders to display for this patient.  Lab Results  Component Value Date   TSH 2.20 04/03/2020   Lab Results  Component Value Date   WBC 4.7 07/02/2021   HGB 8.8 (L) 07/02/2021   HCT 27.9 (L) 07/02/2021   MCV 90.6 07/02/2021   PLT 174 07/02/2021   Lab Results  Component Value Date   NA 142 07/02/2021   K 4.0 07/02/2021   CO2 24 07/02/2021   GLUCOSE 84 07/02/2021   BUN 13 07/02/2021   CREATININE 0.64 07/02/2021   BILITOT 5.3 (H) 07/02/2021   ALKPHOS 77 07/02/2021   AST 31 07/02/2021   ALT 19 07/02/2021   PROT 4.9 (L) 07/02/2021   ALBUMIN 3.3 (L) 07/02/2021   CALCIUM 8.3 (L) 07/02/2021   ANIONGAP 7 07/02/2021   GFR 90.00 05/31/2020     Assessment & Plan:   Problem List Items Addressed This Visit   None   -Several concerns addressed with the patient today. -There is still not a clear answer of what is causing her  bilateral lower extremity edema.  She did have an echo done on January 23, 2021 which showed ejection fraction of 60 to 65% and grade 2 diastolic dysfunction.  We will plan to repeat echocardiogram and also refer to cardiology at this time.  She knows to present to the emergency department again in the case of sudden chest pain or shortness of breath, or sudden severe swelling in 1 leg versus the other. -Lasix 20 mg was also refilled today and will we will increase this to twice daily.  Suggested she take this with a banana. -I was able to connect with her oncologist Dr. Joan Mayans via phone conversation.  She did not think that her symptoms were related to the [facility]. -I also asked if she had had a head MRI or CT done in the last year that we did not have in our system. Dr. Joan Mayans was also concerned about her intermittent balance issues and agreed that MRI of the brain with and without contrast was a good course of action.  I will go ahead and send this order today. -Referral to hepatology for her liver cirrhosis.  We will plan to repeat ammonia level just to make sure this is not gone any higher.  She was alert and oriented x3 on exam today. -She has been doing well with intermittent use of Ambien 5 mg  for her chronic insomnia.  She is going to be going out of town in the next few weeks and I have printed a refill for her to start on 07/28/2021.   Follow-up: No follow-ups on file.    Blaire Hodsdon M Ilisa Hayworth, PA-C

## 2021-07-15 ENCOUNTER — Telehealth: Payer: Self-pay

## 2021-07-15 NOTE — Telephone Encounter (Signed)
Pt called regarding a scan on her brain. She stated that Alyssa mentioned it yesterday but she is confused on who will be doing it. Please Advise.

## 2021-07-17 NOTE — Telephone Encounter (Signed)
Notified patient of results.Patient voices understanding.

## 2021-07-17 NOTE — Telephone Encounter (Signed)
Left voice message for patient to call clinic.  

## 2021-08-05 ENCOUNTER — Ambulatory Visit: Payer: Medicare PPO | Admitting: Physical Therapy

## 2021-08-07 ENCOUNTER — Other Ambulatory Visit: Payer: Self-pay

## 2021-08-07 ENCOUNTER — Ambulatory Visit
Admission: RE | Admit: 2021-08-07 | Discharge: 2021-08-07 | Disposition: A | Discharge status: Home / Self Care | Payer: Medicare PPO | Source: Ambulatory Visit | Attending: Physician Assistant | Admitting: Physician Assistant

## 2021-08-07 DIAGNOSIS — Z1231 Encounter for screening mammogram for malignant neoplasm of breast: Secondary | ICD-10-CM | POA: Diagnosis not present

## 2021-08-12 ENCOUNTER — Encounter: Payer: Self-pay | Admitting: Physician Assistant

## 2021-08-13 ENCOUNTER — Encounter: Payer: Self-pay | Admitting: Physician Assistant

## 2021-08-25 ENCOUNTER — Telehealth: Payer: Self-pay

## 2021-08-25 NOTE — Telephone Encounter (Signed)
Triage nurse sent patient through for scheduling.    I have scheduled patient for 08/26/21 at 8:30am.

## 2021-08-26 ENCOUNTER — Ambulatory Visit: Payer: Medicare PPO | Admitting: Physician Assistant

## 2021-08-26 ENCOUNTER — Ambulatory Visit (HOSPITAL_COMMUNITY)
Admission: RE | Admit: 2021-08-26 | Discharge: 2021-08-26 | Disposition: A | Discharge status: Home / Self Care | Payer: Medicare PPO | Source: Ambulatory Visit | Attending: Physician Assistant | Admitting: Physician Assistant

## 2021-08-26 ENCOUNTER — Encounter (HOSPITAL_COMMUNITY): Payer: Self-pay

## 2021-08-26 ENCOUNTER — Other Ambulatory Visit: Payer: Self-pay

## 2021-08-26 ENCOUNTER — Telehealth: Payer: Self-pay | Admitting: *Deleted

## 2021-08-26 ENCOUNTER — Ambulatory Visit: Payer: Medicare PPO | Admitting: Physical Therapy

## 2021-08-26 VITALS — BP 133/73 | HR 65 | Temp 98.2°F | Ht 66.0 in | Wt 181.2 lb

## 2021-08-26 DIAGNOSIS — D7581 Myelofibrosis: Secondary | ICD-10-CM

## 2021-08-26 DIAGNOSIS — R109 Unspecified abdominal pain: Secondary | ICD-10-CM | POA: Diagnosis not present

## 2021-08-26 DIAGNOSIS — R1084 Generalized abdominal pain: Secondary | ICD-10-CM | POA: Diagnosis not present

## 2021-08-26 DIAGNOSIS — K7469 Other cirrhosis of liver: Secondary | ICD-10-CM

## 2021-08-26 DIAGNOSIS — R110 Nausea: Secondary | ICD-10-CM | POA: Diagnosis not present

## 2021-08-26 DIAGNOSIS — R17 Unspecified jaundice: Secondary | ICD-10-CM | POA: Insufficient documentation

## 2021-08-26 LAB — CBC WITH DIFFERENTIAL/PLATELET
Basophils Absolute: 0.1 10*3/uL (ref 0.0–0.1)
Basophils Relative: 2 % (ref 0.0–3.0)
Eosinophils Absolute: 0.2 10*3/uL (ref 0.0–0.7)
Eosinophils Relative: 3.5 % (ref 0.0–5.0)
HCT: 31 % — ABNORMAL LOW (ref 36.0–46.0)
Hemoglobin: 10.1 g/dL — ABNORMAL LOW (ref 12.0–15.0)
Lymphocytes Relative: 10.8 % — ABNORMAL LOW (ref 12.0–46.0)
Lymphs Abs: 0.7 10*3/uL (ref 0.7–4.0)
MCHC: 32.4 g/dL (ref 30.0–36.0)
MCV: 89.4 fl (ref 78.0–100.0)
Monocytes Absolute: 0.3 10*3/uL (ref 0.1–1.0)
Monocytes Relative: 5 % (ref 3.0–12.0)
Neutro Abs: 5.2 10*3/uL (ref 1.4–7.7)
Neutrophils Relative %: 78.7 % — ABNORMAL HIGH (ref 43.0–77.0)
Platelets: 264 10*3/uL (ref 150.0–400.0)
RBC: 3.47 Mil/uL — ABNORMAL LOW (ref 3.87–5.11)
RDW: 25.9 % — ABNORMAL HIGH (ref 11.5–15.5)
WBC: 6.6 10*3/uL (ref 4.0–10.5)

## 2021-08-26 LAB — POCT URINALYSIS DIPSTICK
Bilirubin, UA: NEGATIVE
Blood, UA: NEGATIVE
Glucose, UA: NEGATIVE
Ketones, UA: NEGATIVE
Leukocytes, UA: NEGATIVE
Nitrite, UA: NEGATIVE
Protein, UA: NEGATIVE
Spec Grav, UA: 1.02 (ref 1.010–1.025)
Urobilinogen, UA: 1 E.U./dL — AB
pH, UA: 6 (ref 5.0–8.0)

## 2021-08-26 LAB — COMPREHENSIVE METABOLIC PANEL
ALT: 23 U/L (ref 0–35)
AST: 36 U/L (ref 0–37)
Albumin: 3.8 g/dL (ref 3.5–5.2)
Alkaline Phosphatase: 90 U/L (ref 39–117)
BUN: 17 mg/dL (ref 6–23)
CO2: 26 mEq/L (ref 19–32)
Calcium: 8.7 mg/dL (ref 8.4–10.5)
Chloride: 109 mEq/L (ref 96–112)
Creatinine, Ser: 0.58 mg/dL (ref 0.40–1.20)
GFR: 90.88 mL/min (ref >60.00)
Glucose, Bld: 95 mg/dL (ref 70–99)
Potassium: 4 mEq/L (ref 3.5–5.1)
Sodium: 142 mEq/L (ref 135–145)
Total Bilirubin: 4.6 mg/dL — ABNORMAL HIGH (ref 0.2–1.2)
Total Protein: 5.5 g/dL — ABNORMAL LOW (ref 6.0–8.3)

## 2021-08-26 LAB — LIPASE: Lipase: 8 U/L — ABNORMAL LOW (ref 11.0–59.0)

## 2021-08-26 LAB — AMYLASE: Amylase: 20 U/L — ABNORMAL LOW (ref 27–131)

## 2021-08-26 MED ORDER — IOHEXOL 350 MG/ML SOLN
75.0000 mL | Freq: Once | INTRAVENOUS | Status: AC | PRN
  Administered 2021-08-26: 75 mL via INTRAVENOUS

## 2021-08-26 NOTE — Progress Notes (Signed)
Subjective:    Patient ID: Courtney Buckley, female    DOB: 07-Aug-1951, 70 y.o.   MRN: [id number]  Chief Complaint  Patient presents with   Abdominal Pain    Nausea, pain radiates to back    HPI Patient is in today for lower abdominal pain x 5 weeks. Radiates to the back. Feels like an inner tube, bloated. Bowel movements do not affect the pain. States if she doesn't eat every 3-4 hours it will hurt worse. Also feels nauseated, no vomiting. Regular bowel movements, no blood in the stool. No urinary changes. No burning, frequency, or blood. No hx of abdominal surgeries.   Per pt, she spoke with oncologist Dr. Joan Mayans -suggested she see urologist and didn't think it had anything to do with her cancer.  She is requesting pain medication because she cannot sleep because of the pain.   Past Medical History:  Diagnosis Date   Cirrhosis ([place])    Colon polyps    Depression    Esophageal varices (HCC)    Gastritis    Internal hemorrhoids    Migraine    Portal hypertensive gastropathy (HCC)    PVT (portal vein thrombosis)     Past Surgical History:  Procedure Laterality Date   LIVER BIOPSY      Family History  Problem Relation Age of Onset   Cancer - Lung Father    Diabetes Mellitus II Maternal Aunt    Stomach cancer Maternal Aunt    Diabetes Mellitus II Maternal Uncle    Colon cancer Neg Hx    Rectal cancer Neg Hx    Esophageal cancer Neg Hx    Liver disease Neg Hx    Pancreatic cancer Neg Hx     Social History   Tobacco Use   Smoking status: Former    Types: Cigarettes   Smokeless tobacco: Never  Vaping Use   Vaping Use: Never used  Substance Use Topics   Alcohol use: Not Currently    Comment: Occasionally-1 acoholic beverage every 1-2 weeks   Drug use: No     No Known Allergies  Review of Systems REFER TO HPI FOR PERTINENT POSITIVES AND NEGATIVES      Objective:     BP 133/73   Pulse 65   Temp 98.2 F (36.8 C)   Ht 5\' 6"  (1.676 m)   Wt 181 lb  3.2 oz (82.2 kg)   SpO2 99%   BMI 29.25 kg/m   Wt Readings from Last 3 Encounters:  08/26/21 181 lb 3.2 oz (82.2 kg)  07/14/21 180 lb 3.2 oz (81.7 kg)  07/02/21 180 lb 7.1 oz (81.8 kg)    BP Readings from Last 3 Encounters:  08/27/21 123/90  08/26/21 133/73  07/14/21 112/66     Physical Exam Vitals and nursing note reviewed.  Constitutional:      Appearance: Normal appearance. She is normal weight.  HENT:     Head: Normocephalic and atraumatic.  Eyes:     Extraocular Movements: Extraocular movements intact.     Pupils: Pupils are equal, round, and reactive to light.  Cardiovascular:     Rate and Rhythm: Normal rate and regular rhythm.     Heart sounds: Murmur heard.  Systolic murmur is present with a grade of 4/6.  Pulmonary:     Effort: Pulmonary effort is normal.     Breath sounds: Normal breath sounds.  Abdominal:     General: Bowel sounds are normal.     Palpations: There  is hepatomegaly and splenomegaly.     Tenderness: There is generalized abdominal tenderness. There is no right CVA tenderness, left CVA tenderness, guarding or rebound. Negative signs include Murphy's sign, Rovsing's sign, McBurney's sign and obturator sign.  Musculoskeletal:     Right lower leg: No edema.     Left lower leg: No edema.  Skin:    General: Skin is warm and dry.     Comments: Generalized jaundiced appearance, no scleral icterus; baseline for patient  Neurological:     Mental Status: She is alert.  Psychiatric:        Mood and Affect: Affect normal. Mood is anxious and depressed.       Assessment & Plan:   Problem List Items Addressed This Visit       Other   Myelofibrosis ([place])   Relevant Orders   CT Abdomen Pelvis W Contrast (Completed)   CBC with Differential/Platelet (Completed)   Comprehensive metabolic panel (Completed)   Lipase (Completed)   Amylase (Completed)   POCT urinalysis dipstick (Completed)   Urine Culture (Completed)   Other Visit Diagnoses      Generalized abdominal pain    -  Primary   Relevant Orders   CT Abdomen Pelvis W Contrast (Completed)   CBC with Differential/Platelet (Completed)   Comprehensive metabolic panel (Completed)   Lipase (Completed)   Amylase (Completed)   POCT urinalysis dipstick (Completed)   Urine Culture (Completed)   Other cirrhosis of liver (HCC)       Relevant Orders   CT Abdomen Pelvis W Contrast (Completed)   CBC with Differential/Platelet (Completed)   Comprehensive metabolic panel (Completed)   Lipase (Completed)   Amylase (Completed)   POCT urinalysis dipstick (Completed)   Urine Culture (Completed)   Nausea       Relevant Orders   CT Abdomen Pelvis W Contrast (Completed)   CBC with Differential/Platelet (Completed)   Comprehensive metabolic panel (Completed)   Lipase (Completed)   Amylase (Completed)   POCT urinalysis dipstick (Completed)   Urine Culture (Completed)   Jaundice       Relevant Orders   CT Abdomen Pelvis W Contrast (Completed)   CBC with Differential/Platelet (Completed)   Comprehensive metabolic panel (Completed)   Lipase (Completed)   Amylase (Completed)   POCT urinalysis dipstick (Completed)   Urine Culture (Completed)       Plan: -Outpatient STAT labs and CT abd/pelvis to make sure no changes from previous results, which I reviewed with her again today (from 07/02/21). -Informed her I could not treat her pain until we have a better answer in regards to the source of the pain. -Encouraged her to keep pushing fluids. -Low threshold for ED   Montrelle Eddings M Seynabou Fults, PA-C

## 2021-08-26 NOTE — Telephone Encounter (Signed)
Noted. See results. Thank you.

## 2021-08-26 NOTE — Telephone Encounter (Signed)
Alyssa, received call from [facility] regarding pt. They have left the pt leave and the report is in Epic please review.

## 2021-08-26 NOTE — Telephone Encounter (Signed)
Received call from [place] with [facility] dept calling about pt's report. Megan said [place] ordered and was told to call with report. She said the report is lengthy don't know if you want to write it down or just look in Epic. She said the pt has left. Told her okay I will send Alyssa a message saying report is in Epic to review. Megan verbalized understanding.

## 2021-08-27 ENCOUNTER — Telehealth (INDEPENDENT_AMBULATORY_CARE_PROVIDER_SITE_OTHER): Payer: Medicare PPO | Admitting: Physician Assistant

## 2021-08-27 ENCOUNTER — Encounter: Payer: Self-pay | Admitting: Physician Assistant

## 2021-08-27 ENCOUNTER — Emergency Department (HOSPITAL_COMMUNITY)
Admission: EM | Admit: 2021-08-27 | Discharge: 2021-08-28 | Disposition: A | Discharge status: Home / Self Care | Payer: Medicare PPO | Attending: Emergency Medicine | Admitting: Emergency Medicine

## 2021-08-27 ENCOUNTER — Encounter (HOSPITAL_COMMUNITY): Payer: Self-pay

## 2021-08-27 ENCOUNTER — Other Ambulatory Visit: Payer: Self-pay

## 2021-08-27 DIAGNOSIS — K8000 Calculus of gallbladder with acute cholecystitis without obstruction: Secondary | ICD-10-CM

## 2021-08-27 DIAGNOSIS — Z87891 Personal history of nicotine dependence: Secondary | ICD-10-CM | POA: Diagnosis not present

## 2021-08-27 DIAGNOSIS — R110 Nausea: Secondary | ICD-10-CM

## 2021-08-27 DIAGNOSIS — Z20822 Contact with and (suspected) exposure to covid-19: Secondary | ICD-10-CM | POA: Insufficient documentation

## 2021-08-27 DIAGNOSIS — R112 Nausea with vomiting, unspecified: Secondary | ICD-10-CM | POA: Diagnosis not present

## 2021-08-27 DIAGNOSIS — K8020 Calculus of gallbladder without cholecystitis without obstruction: Secondary | ICD-10-CM | POA: Diagnosis not present

## 2021-08-27 DIAGNOSIS — D7581 Myelofibrosis: Secondary | ICD-10-CM

## 2021-08-27 DIAGNOSIS — R1011 Right upper quadrant pain: Secondary | ICD-10-CM | POA: Diagnosis not present

## 2021-08-27 DIAGNOSIS — R1084 Generalized abdominal pain: Secondary | ICD-10-CM

## 2021-08-27 DIAGNOSIS — K7469 Other cirrhosis of liver: Secondary | ICD-10-CM | POA: Diagnosis not present

## 2021-08-27 LAB — CBC WITH DIFFERENTIAL/PLATELET
Abs Immature Granulocytes: 0.05 10*3/uL (ref 0.00–0.07)
Basophils Absolute: 0.1 10*3/uL (ref 0.0–0.1)
Basophils Relative: 2 %
Eosinophils Absolute: 0.2 10*3/uL (ref 0.0–0.5)
Eosinophils Relative: 3 %
HCT: 29.8 % — ABNORMAL LOW (ref 36.0–46.0)
Hemoglobin: 9.6 g/dL — ABNORMAL LOW (ref 12.0–15.0)
Immature Granulocytes: 1 %
Lymphocytes Relative: 7 %
Lymphs Abs: 0.5 10*3/uL — ABNORMAL LOW (ref 0.7–4.0)
MCH: 29.4 pg (ref 26.0–34.0)
MCHC: 32.2 g/dL (ref 30.0–36.0)
MCV: 91.4 fL (ref 80.0–100.0)
Monocytes Absolute: 0.4 10*3/uL (ref 0.1–1.0)
Monocytes Relative: 5 %
Neutro Abs: 6.2 10*3/uL (ref 1.7–7.7)
Neutrophils Relative %: 82 %
Platelets: 232 10*3/uL (ref 150–400)
RBC: 3.26 MIL/uL — ABNORMAL LOW (ref 3.87–5.11)
RDW: 24.2 % — ABNORMAL HIGH (ref 11.5–15.5)
WBC: 7.5 10*3/uL (ref 4.0–10.5)
nRBC: 0.3 % — ABNORMAL HIGH (ref 0.0–0.2)

## 2021-08-27 LAB — URINE CULTURE
MICRO NUMBER:: 12547805
SPECIMEN QUALITY:: ADEQUATE

## 2021-08-27 LAB — COMPREHENSIVE METABOLIC PANEL
ALT: 24 U/L (ref 0–44)
AST: 37 U/L (ref 15–41)
Albumin: 3.5 g/dL (ref 3.5–5.0)
Alkaline Phosphatase: 74 U/L (ref 38–126)
Anion gap: 5 (ref 5–15)
BUN: 16 mg/dL (ref 8–23)
CO2: 24 mmol/L (ref 22–32)
Calcium: 8.4 mg/dL — ABNORMAL LOW (ref 8.9–10.3)
Chloride: 107 mmol/L (ref 98–111)
Creatinine, Ser: 0.47 mg/dL (ref 0.44–1.00)
GFR, Estimated: >60 mL/min (ref >60)
Glucose, Bld: 136 mg/dL — ABNORMAL HIGH (ref 70–99)
Potassium: 3.7 mmol/L (ref 3.5–5.1)
Sodium: 136 mmol/L (ref 135–145)
Total Bilirubin: 5 mg/dL — ABNORMAL HIGH (ref 0.3–1.2)
Total Protein: 5.6 g/dL — ABNORMAL LOW (ref 6.5–8.1)

## 2021-08-27 LAB — LIPASE, BLOOD: Lipase: 27 U/L (ref 11–51)

## 2021-08-27 MED ORDER — HYDROCODONE-ACETAMINOPHEN 10-325 MG PO TABS: 1.0000 | ORAL_TABLET | Freq: Three times a day (TID) | ORAL | 0 refills | Status: AC | PRN

## 2021-08-27 MED ORDER — PROMETHAZINE HCL 12.5 MG PO TABS: 12.5000 mg | ORAL_TABLET | Freq: Three times a day (TID) | ORAL | 0 refills | Status: DC | PRN

## 2021-08-27 NOTE — ED Provider Notes (Signed)
Emergency Medicine Provider Triage Evaluation Note  Courtney Buckley , a 71 y.o. female  was evaluated in triage.  Pt complains of abdominal pain.  States she was recently diagnosed with cholelithiasis.  She has been experiencing waxing and waning abdominal pain as well as nausea/vomiting for weeks.  She was prescribed promethazine as well as Norco and told that if her symptoms continue to be refractory to come back to the emergency department for surgical consultation.  Physical Exam  There were no vitals taken for this visit. Gen:   Awake, no distress   Resp:  Normal effort  MSK:   Moves extremities without difficulty  Other:    Medical Decision Making  Medically screening exam initiated at 10:45 PM.  Appropriate orders placed.  Courtney Buckley was informed that the remainder of the evaluation will be completed by another provider, this initial triage assessment does not replace that evaluation, and the importance of remaining in the ED until their evaluation is complete.   Rayna Sexton, PA-C 08/27/21 2246    Isla Pence, MD 08/27/21 [phone]

## 2021-08-27 NOTE — ED Triage Notes (Addendum)
Pt states she had a scan done yesterday that showed she has gallstones. Pt states her PCP wrote her prescriptions for pain and nausea, but that if the pain and nausea persist to come to the ED. Pt states she is in 10/10 pain. Pt states this has been going on for 5 weeks.

## 2021-08-27 NOTE — Telephone Encounter (Signed)
Virtual visit completed today.

## 2021-08-27 NOTE — Progress Notes (Signed)
Virtual Visit via Telephone Note  I connected with Courtney Buckley on 08/27/21 at 11:45 AM EDT by telephone and verified that I am speaking with the correct person using two identifiers.  Location: Patient: home Provider: Therapist, music at [employer]  I discussed the limitations, risks, security and privacy concerns of performing an evaluation and management service by telephone and the availability of in person appointments. I also discussed with the patient that there may be a patient responsible charge related to this service. The patient expressed understanding and agreed to proceed.   History of Present Illness: F/up from yesterday's visit to discuss CT results with patient. Still having same pain and nausea as discussed yesterday. She did not sleep well because of the pain. Zofran prn did not help her nausea.    Observations/Objective: Pleasant, A&Ox3; complains of pain   Assessment and Plan:  1. Calculus of gallbladder with acute cholecystitis without obstruction 2. Generalized abdominal pain 3. Myelofibrosis ([place]) 4. Other cirrhosis of liver ([place]) 5. Nausea I personally reviewed the CT impression report with patient via phone call today:   IMPRESSION: 1. No acute findings in the abdomen or pelvis. Specifically, no findings to explain the patient's history of abdominal pain. 2. 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 Interventional radiology consultation recommended. 3. Chronic portal vein occlusion with extensive venous collateralization in the left abdomen suggesting spleno renal shunt. 4. Cirrhosis. 5. Cholelithiasis. 6. Small volume free fluid in the cul-de-sac. 7. Uterine fibroid. 8. Stable 13 mm hypoattenuating lesion in the posterior right liver was characterized as hemangioma on MRI 10/03/2020.  Discussed with her possibility of gallbladder  cause of her 5 weeks of abdominal pain and nausea. Suggested urgent consult with general surgeon for second opinion; she is agreeable & referral sent to Dr. Rosendo Gros. Norco and phenergan as directed to help with pain and nausea. VERY LOW THRESHOLD for ED if she does not get any relief or symptoms acutely worsen.   Follow Up Instructions:    I discussed the assessment and treatment plan with the patient. The patient was provided an opportunity to ask questions and all were answered. The patient agreed with the plan and demonstrated an understanding of the instructions.   The patient was advised to call back or seek an in-person evaluation if the symptoms worsen or if the condition fails to improve as anticipated.  I provided 11 minutes & 55 seconds of non-face-to-face time during this encounter.   Alexina Niccoli M Marlowe Lawes, PA-C

## 2021-08-28 ENCOUNTER — Telehealth: Payer: Self-pay | Admitting: Internal Medicine

## 2021-08-28 ENCOUNTER — Encounter: Payer: Self-pay | Admitting: Physician Assistant

## 2021-08-28 ENCOUNTER — Telehealth: Payer: Self-pay

## 2021-08-28 ENCOUNTER — Encounter (HOSPITAL_COMMUNITY): Payer: Self-pay | Admitting: General Surgery

## 2021-08-28 DIAGNOSIS — R1084 Generalized abdominal pain: Secondary | ICD-10-CM | POA: Diagnosis not present

## 2021-08-28 DIAGNOSIS — K8020 Calculus of gallbladder without cholecystitis without obstruction: Secondary | ICD-10-CM | POA: Diagnosis not present

## 2021-08-28 LAB — RESP PANEL BY RT-PCR (FLU A&B, COVID) ARPGX2
Influenza A by PCR: NEGATIVE
Influenza B by PCR: NEGATIVE
SARS Coronavirus 2 by RT PCR: NEGATIVE

## 2021-08-28 LAB — BILIRUBIN, DIRECT: Bilirubin, Direct: 0.6 mg/dL — ABNORMAL HIGH (ref 0.0–0.2)

## 2021-08-28 MED ORDER — HYDROMORPHONE HCL 1 MG/ML IJ SOLN
1.0000 mg | Freq: Once | INTRAMUSCULAR | Status: AC
  Administered 2021-08-28: 1 mg via INTRAVENOUS
  Filled 2021-08-28: qty 1

## 2021-08-28 MED ORDER — SODIUM CHLORIDE 0.9 % IV BOLUS
1000.0000 mL | Freq: Once | INTRAVENOUS | Status: AC
  Administered 2021-08-28: 1000 mL via INTRAVENOUS

## 2021-08-28 MED ORDER — ONDANSETRON HCL 4 MG/2ML IJ SOLN
4.0000 mg | Freq: Once | INTRAMUSCULAR | Status: AC
  Administered 2021-08-28: 4 mg via INTRAVENOUS
  Filled 2021-08-28: qty 2

## 2021-08-28 NOTE — ED Provider Notes (Signed)
[facility] DEPT Provider Note   CSN: [id number] Arrival date & time: 08/27/21  2216     History Chief Complaint  Patient presents with   Cholelithiasis    Courtney Buckley is a 70 y.o. female.  HPI  70 year old female with a history of MDS, liver cirrhosis, esophageal varices, chronic portal venous thrombosis, recent diagnosis of cholelithiasis presenting to the emergency department with worsening nausea and vomiting.  Patient states that she has had roughly 5 weeks of right upper quadrant abdominal pain, sharp, radiating to her back with mild shortness of breath.  She had a CT scan on 10/25 which revealed cholelithiasis without evidence of cholecystitis.  The patient had a video visit with her PCP [facility] yesterday 10/26 and discussed the results of her CT scan which revealed cholelithiasis.  She was prescribed Norco and Phenergan and an outpatient ambulatory referral to general surgery was placed.  She was told to present to the emergency department in the event of worsening nausea and vomiting and worsening pain.  She had difficulty keeping her outpatient medications down and so presented overnight to the emergency department.  She last took Norco at 0400 and has not taken any Phenergan overnight.  Past Medical History:  Diagnosis Date   Cirrhosis ([place])    Colon polyps    Depression    Esophageal varices ([facility])    Gastritis    Internal hemorrhoids    Migraine    Portal hypertensive gastropathy ([facility])    PVT (portal vein thrombosis)     Patient Active Problem List   Diagnosis Date Noted   Myelofibrosis ([place]) 10/04/2020    Past Surgical History:  Procedure Laterality Date   LIVER BIOPSY       OB History     Gravida  2   Para  2   Term      Preterm      AB      Living         SAB      IAB      Ectopic      Multiple      Live Births              Family History  Problem Relation Age of Onset    Cancer - Lung Father    Diabetes Mellitus II Maternal Aunt    Stomach cancer Maternal Aunt    Diabetes Mellitus II Maternal Uncle    Colon cancer Neg Hx    Rectal cancer Neg Hx    Esophageal cancer Neg Hx    Liver disease Neg Hx    Pancreatic cancer Neg Hx     Social History   Tobacco Use   Smoking status: Former    Types: Cigarettes   Smokeless tobacco: Never  Vaping Use   Vaping Use: Never used  Substance Use Topics   Alcohol use: Not Currently    Comment: Occasionally-1 acoholic beverage every 1-2 weeks   Drug use: No    Home Medications Prior to Admission medications   Medication Sig Start Date End Date Taking? Authorizing Provider  furosemide (LASIX) 20 MG tablet Take 1 tablet (20 mg total) by mouth 2 (two) times daily. 07/14/21 08/26/21  Allwardt, Randa Evens, PA-C  HYDROcodone-acetaminophen (NORCO) 10-325 MG tablet Take 1 tablet by mouth every 8 (eight) hours as needed for up to 5 days. 08/27/21 09/01/21  Allwardt, Alyssa M, PA-C  JAKAFI 10 MG tablet Take 10 mg by mouth 2 (  two) times daily. 06/11/21   [provider]  Multiple Vitamin (MULTIVITAMIN ADULT PO) Take by mouth.    [provider]  promethazine (PHENERGAN) 12.5 MG tablet Take 1 tablet (12.5 mg total) by mouth every 8 (eight) hours as needed for nausea or vomiting. 08/27/21   Allwardt, Randa Evens, PA-C  zolpidem (AMBIEN) 5 MG tablet Take 1 tablet (5 mg total) by mouth at bedtime as needed for sleep. 07/28/21 08/27/21  Allwardt, Randa Evens, PA-C    Allergies    Patient has no known allergies.  Review of Systems   Review of Systems  Constitutional:  Negative for chills and fever.  HENT:  Negative for ear pain and sore throat.   Eyes:  Negative for pain and visual disturbance.  Respiratory:  Negative for cough and shortness of breath.   Cardiovascular:  Negative for chest pain and palpitations.  Gastrointestinal:  Positive for abdominal pain, nausea and vomiting.  Genitourinary:  Negative for  dysuria and hematuria.  Musculoskeletal:  Negative for arthralgias and back pain.  Skin:  Negative for color change and rash.  Neurological:  Negative for seizures and syncope.  All other systems reviewed and are negative.  Physical Exam Updated Vital Signs BP (!) 130/55 (BP Location: Right Arm)   Pulse 85   Temp 98 F (36.7 C) (Oral)   Resp 17   Ht 5\' 6"  (1.676 m)   Wt 81.6 kg   SpO2 95%   BMI 29.05 kg/m   Physical Exam Vitals and nursing note reviewed.  Constitutional:      General: She is not in acute distress.    Appearance: She is well-developed.  HENT:     Head: Normocephalic and atraumatic.  Eyes:     Conjunctiva/sclera: Conjunctivae normal.     Pupils: Pupils are equal, round, and reactive to light.  Cardiovascular:     Rate and Rhythm: Normal rate and regular rhythm.     Pulses: Normal pulses.     Heart sounds: No murmur heard. Pulmonary:     Effort: Pulmonary effort is normal. No respiratory distress.     Breath sounds: Normal breath sounds.  Abdominal:     General: There is no distension.     Palpations: Abdomen is soft.     Tenderness: There is abdominal tenderness in the right upper quadrant. There is no guarding or rebound. Negative signs include Murphy's sign.  Musculoskeletal:        General: No deformity or signs of injury.     Cervical back: Normal range of motion and neck supple.  Skin:    General: Skin is warm and dry.     Findings: No lesion or rash.  Neurological:     General: No focal deficit present.     Mental Status: She is alert and oriented to person, place, and time. Mental status is at baseline.     GCS: GCS eye subscore is 4. GCS verbal subscore is 5. GCS motor subscore is 6.    ED Results / Procedures / Treatments   Labs (all labs ordered are listed, but only abnormal results are displayed) Labs Reviewed  COMPREHENSIVE METABOLIC PANEL - Abnormal; Notable for the following components:      Result Value   Glucose, Bld 136 (*)     Calcium 8.4 (*)    Total Protein 5.6 (*)    Total Bilirubin 5.0 (*)    All other components within normal limits  CBC WITH DIFFERENTIAL/PLATELET - Abnormal; Notable for  the following components:   RBC 3.26 (*)    Hemoglobin 9.6 (*)    HCT 29.8 (*)    RDW 24.2 (*)    nRBC 0.3 (*)    Lymphs Abs 0.5 (*)    All other components within normal limits  BILIRUBIN, DIRECT - Abnormal; Notable for the following components:   Bilirubin, Direct 0.6 (*)    All other components within normal limits  RESP PANEL BY RT-PCR (FLU A&B, COVID) ARPGX2  LIPASE, BLOOD    EKG None  Radiology CT Abdomen Pelvis W Contrast  Result Date: 08/26/2021 CLINICAL DATA:  Abdominal pain. EXAM: CT ABDOMEN AND PELVIS WITH CONTRAST TECHNIQUE: Multidetector CT imaging of the abdomen and pelvis was performed using the standard protocol following bolus administration of intravenous contrast. CONTRAST:  44mL OMNIPAQUE IOHEXOL 350 MG/ML SOLN COMPARISON:  07/02/2021 FINDINGS: Lower chest: Unremarkable. Hepatobiliary: 13 mm hypoattenuating lesion in the posterior right liver is similar to prior and was characterized as hemangioma on MRI 10/03/2020. No arterial phase hyperenhancement within the liver parenchyma. Multiple gallstones again noted. No intrahepatic or extrahepatic biliary dilation. Pancreas: No focal mass lesion. No dilatation of the main duct. No intraparenchymal cyst. No peripancreatic edema. Spleen: Marked splenomegaly with extensive venous collateralization in the left abdomen. Adrenals/Urinary Tract: No adrenal nodule or mass. Kidneys unremarkable. No evidence for hydroureter. The urinary bladder appears normal for the degree of distention. Stomach/Bowel: Stomach is unremarkable. No gastric wall thickening. No evidence of outlet obstruction. Duodenum is normally positioned as is the ligament of Treitz. No small bowel wall thickening. No small bowel dilatation. The terminal ileum is normal. The appendix is not well  visualized, but there is no edema or inflammation in the region of the cecum. No gross colonic mass. No colonic wall thickening. Vascular/Lymphatic: No abdominal aortic aneurysm. Chronic portal vein occlusion again noted with extensive venous collateralization in the left abdomen suggesting spleno renal shunt. 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 There is no gastrohepatic or hepatoduodenal ligament lymphadenopathy. No retroperitoneal or mesenteric lymphadenopathy. No pelvic sidewall lymphadenopathy. Reproductive: Mass lesion in the uterine fundus is likely a fibroid. There is no adnexal mass. Other: Small volume free fluid in the cul-de-sac. Stable appearance of mesenteric congestion and trace fluid in the right paracolic gutter. Musculoskeletal: No worrisome lytic or sclerotic osseous abnormality. IMPRESSION: 1. No acute findings in the abdomen or pelvis. Specifically, no findings to explain the patient's history of abdominal pain. 2. 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 Interventional radiology consultation recommended. 3. Chronic portal vein occlusion with extensive venous collateralization in the left abdomen suggesting spleno renal shunt. 4. Cirrhosis. 5. Cholelithiasis. 6. Small volume free fluid in the cul-de-sac. 7. Uterine fibroid. 8. Stable 13 mm hypoattenuating lesion in the posterior right liver was characterized as hemangioma on MRI 10/03/2020. Electronically Signed   By: Misty Stanley M.D.   On: 08/26/2021 15:46    Procedures Procedures   Medications Ordered in ED Medications  HYDROmorphone (DILAUDID) injection 1 mg (1 mg Intravenous Given 08/28/21 0910)  sodium chloride 0.9 % bolus 1,000 mL (1,000 mLs Intravenous New Bag/Given 08/28/21 0908)  ondansetron (ZOFRAN)  injection 4 mg (4 mg Intravenous Given 08/28/21 0370)    ED Course  I have reviewed the triage vital signs and the nursing notes.  Pertinent  labs & imaging results that were available during my care of the patient were reviewed by me and considered in my medical decision making (see chart for details).    MDM Rules/Calculators/A&P                           70 year old female with a history of MDS, liver cirrhosis, esophageal varices, chronic portal venous thrombosis, recent diagnosis of cholelithiasis presenting to the emergency department with worsening nausea and vomiting.  Patient states that she has had roughly 5 weeks of right upper quadrant abdominal pain, sharp, radiating to her back with mild shortness of breath.  She had a CT scan on 10/25 which revealed cholelithiasis without evidence of cholecystitis.  The patient had a video visit with her PCP [facility] yesterday 10/26 and discussed the results of her CT scan which revealed cholelithiasis.  She was prescribed Norco and Phenergan and an outpatient ambulatory referral to general surgery was placed.  She was told to present to the emergency department in the event of worsening nausea and vomiting and worsening pain.  She had difficulty keeping her outpatient medications down and so presented overnight to the emergency department.  She last took Norco at 0400 and has not taken any Phenergan overnight.  On arrival, patient was afebrile, hemodynamically stable, saturating well on room air.  Physical exam significant for right upper quadrant tenderness with negative Murphy sign, no rebound or guarding.  Abdomen overall is soft.  Lower suspicion for acute cholecystitis at this time.  Initial labs overnight were significant for normal lipase, CBC without a leukocytosis, stable anemia to 9.6, CMP without significant electrolyte abnormality, normal LFTs, normal alkaline phosphatase, elevated T bili to 5.0.  IV access was obtained and the  patient was administered an IV fluid bolus, IV Dilaudid and IV Zofran for pain and nausea.  She describes 10 out of 10 right upper quadrant abdominal pain that comes in waves.  Given the patient's symptomatic cholelithiasis and failure of outpatient management, general surgery was consulted.  The patient's laboratory work-up was evaluated and appears to be at baseline.  She has a chronically elevated T bili.  Her hemoglobin appears stable at 9.6.  She has no leukocytosis or platelet abnormality.  Her CMP is without LFT elevation.  Lipase is normal.  She is hemodynamically stable, afebrile, not septic appearing, tolerating oral intake.  She had been holding oral intake and her home Phenergan in anticipation of possible cholecystectomy after discussion with her PCP.  Dental surgery evaluated the patient.  Her CT scan was reviewed by general surgery, no findings concerning for cholecystitis.  The patient also has no indication on physical exam or laboratory work-up concerning for cholecystitis.  Cholelithiasis has been present on imaging in 2021 and on.  General surgery did not feel that cholelithiasis is a source of her current symptoms.  Felt that her symptoms were more attributable to cirrhosis.  Recommended current pain management per for PCP, follow-up with gastroenterology and possibly hepatologist outpatient per PCP referral.  Tolerating oral intake, stable for discharge.  Final Clinical Impression(s) / ED Diagnoses Final diagnoses:  Gallstones  RUQ pain  Nausea and vomiting, unspecified vomiting type    Rx / DC Orders ED Discharge Orders     None        Regan Lemming, MD 08/28/21 1124

## 2021-08-28 NOTE — Discharge Instructions (Addendum)
You were evaluated in the Emergency Department and after careful evaluation, we did not find any emergent condition requiring admission or further testing in the hospital.  Your exam/testing today was overall reassuring. Please follow-up outpatient regarding your RUQ pain, cirrhosis, chronic portal venous thrombosis. You were evaluated by general surgery regarding your RUQ pain and known gallstones. Your labs are at baseline. As you are tolerating oral intake, do not feel that inpatient hospitalization is warranted at this time. Recommend follow-up with your PCP regarding your referral to a hepatologist.  Please return to the Emergency Department if you experience any worsening of your condition.  Thank you for allowing [place] to be a part of your care.

## 2021-08-28 NOTE — Telephone Encounter (Signed)
Noted and agreed, thank you. 

## 2021-08-28 NOTE — Consult Note (Signed)
Consult Note  Courtney Buckley 1950-12-02  [id number].    Requesting MD: Dr. Armandina Gemma Chief Complaint/Reason for Consult: cholelithiasis, abdominal pain, nausea  HPI:  70 year old female with history of cirrhosis, gastritis, portal hypertensive gastropathy, portal vein thrombosis who presented to the emergency department due to ongoing and steadily worsening abdominal pain, nausea, emesis.  Symptoms have been ongoing for 5 weeks and she has seen her PCP.  Emesis has developed in the last week.   She had a CT scan on 10/25 which showed no acute findings in the abdomen or pelvis, known chronic portal vein occlusion with extensive venous collateralization in the left abdomen suggesting splenorenal shunt, cirrhosis, cholelithiasis, stable 13 mm hypoattenuating lesion in the posterior right liver (hemangioma on MRI 2021).   Work up in ED with normal WBC, t bili elevated to 5.0 (chronically elevated) CT imaging reviewed with patient by PCP yesterday with concern for cholelithiasis as a source of her symptoms.  She was given pain and antiemetic medication and referral made to general surgery for further evaluation.  Her pain continues despite pain medications prompting her presentation to the emergency department.  Patient states her pain occurs at night and she awakens with it.  No correlation with p.o. intake.  Pain is most significant in the lower abdomen and radiates into her back. She does have less severe diffuse abdominal pain in upper abdomen including over LUQ (history of splenomegaly). She has had issues with intermittent constipation in the past but bowel movements normal currently.  She denies any recent fever or chills.  ROS otherwise as below  Substance use: Denies tobacco and alcohol use Allergies: NKDA  Blood thinners: None Past Surgeries: No history of abdominal surgeries   ROS: Review of Systems  Constitutional:  Negative for chills and fever.  Respiratory:  Negative  for cough, shortness of breath and wheezing.   Cardiovascular:  Positive for leg swelling. Negative for chest pain and palpitations.  Gastrointestinal:  Positive for abdominal pain, nausea and vomiting. Negative for constipation and diarrhea.  Genitourinary: Negative.    Family History  Problem Relation Age of Onset   Cancer - Lung Father    Diabetes Mellitus II Maternal Aunt    Stomach cancer Maternal Aunt    Diabetes Mellitus II Maternal Uncle    Colon cancer Neg Hx    Rectal cancer Neg Hx    Esophageal cancer Neg Hx    Liver disease Neg Hx    Pancreatic cancer Neg Hx     Past Medical History:  Diagnosis Date   Cirrhosis ([place])    Colon polyps    Depression    Esophageal varices ([facility])    Gastritis    Internal hemorrhoids    Migraine    Portal hypertensive gastropathy ([facility])    PVT (portal vein thrombosis)     Past Surgical History:  Procedure Laterality Date   LIVER BIOPSY      Social History:  reports that she has quit smoking. Her smoking use included cigarettes. She has never used smokeless tobacco. She reports that she does not currently use alcohol. She reports that she does not use drugs.  Allergies: No Known Allergies  (Not in a hospital admission)   Blood pressure (!) 130/55, pulse 85, temperature 98 F (36.7 C), temperature source Oral, resp. rate 17, height 5\' 6"  (1.676 m), weight 81.6 kg, SpO2 95 %. Physical Exam:  General: pleasant, WD, female who is laying in bed in NAD HEENT: head is  normocephalic, atraumatic.  Sclera are noninjected.  Pupils equal and round.  Ears and nose without any masses or lesions.  Mouth is pink and moist Heart: regular, rate, and rhythm.  Normal s1,s2. No obvious murmurs, gallops, or rubs noted.  Palpable radial and pedal pulses bilaterally Lungs: CTAB, no wheezes, rhonchi, or rales noted.  Respiratory effort nonlabored Abd: soft, ND, +BS, no masses, hernias. Mild TTP diffusely greatest over lower quadrants and suprapubic  region. No guarding or rebound. Palpable hepatomegaly. Palpable splenomegaly. Negative murphy's sign MS: bilateral upper extremities are symmetrical with no cyanosis, clubbing, or edema. LLE with edema to level of ankle. No calf TTP bilaterally Skin: jaundiced. warm and dry with no masses, lesions, or rashes Neuro: Cranial nerves 2-12 grossly intact, sensation is normal throughout Psych: A&Ox3 with an appropriate affect.   Results for orders placed or performed during the hospital encounter of 08/27/21 (from the past 48 hour(s))  Comprehensive metabolic panel     Status: Abnormal   Collection Time: 08/27/21 11:01 PM  Result Value Ref Range   Sodium 136 135 - 145 mmol/L   Potassium 3.7 3.5 - 5.1 mmol/L   Chloride 107 98 - 111 mmol/L   CO2 24 22 - 32 mmol/L   Glucose, Bld 136 (H) 70 - 99 mg/dL    Comment: Glucose reference range applies only to samples taken after fasting for at least 8 hours.   BUN 16 8 - 23 mg/dL   Creatinine, Ser 0.47 0.44 - 1.00 mg/dL   Calcium 8.4 (L) 8.9 - 10.3 mg/dL   Total Protein 5.6 (L) 6.5 - 8.1 g/dL   Albumin 3.5 3.5 - 5.0 g/dL   AST 37 15 - 41 U/L   ALT 24 0 - 44 U/L   Alkaline Phosphatase 74 38 - 126 U/L   Total Bilirubin 5.0 (H) 0.3 - 1.2 mg/dL   GFR, Estimated >60 >60 mL/min    Comment: (NOTE) Calculated using the CKD-EPI Creatinine Equation (2021)    Anion gap 5 5 - 15    Comment: Performed at [facility], [place] [address] [place], [place] [address]  Lipase, blood     Status: None   Collection Time: 08/27/21 11:01 PM  Result Value Ref Range   Lipase 27 11 - 51 U/L    Comment: Performed at [facility], [place] [address] [place], [place] [address]  CBC with Differential     Status: Abnormal   Collection Time: 08/27/21 11:01 PM  Result Value Ref Range   WBC 7.5 4.0 - 10.5 K/uL   RBC 3.26 (L) 3.87 - 5.11 MIL/uL   Hemoglobin 9.6 (L) 12.0 - 15.0 g/dL   HCT 29.8 (L) 36.0 - 46.0 %   MCV 91.4 80.0 - 100.0 fL    MCH 29.4 26.0 - 34.0 pg   MCHC 32.2 30.0 - 36.0 g/dL   RDW 24.2 (H) 11.5 - 15.5 %   Platelets 232 150 - 400 K/uL    Comment: REPEATED TO VERIFY   nRBC 0.3 (H) 0.0 - 0.2 %   Neutrophils Relative % 82 %   Neutro Abs 6.2 1.7 - 7.7 K/uL   Lymphocytes Relative 7 %   Lymphs Abs 0.5 (L) 0.7 - 4.0 K/uL   Monocytes Relative 5 %   Monocytes Absolute 0.4 0.1 - 1.0 K/uL   Eosinophils Relative 3 %   Eosinophils Absolute 0.2 0.0 - 0.5 K/uL   Basophils Relative 2 %   Basophils Absolute 0.1 0.0 -  0.1 K/uL   Immature Granulocytes 1 %   Abs Immature Granulocytes 0.05 0.00 - 0.07 K/uL   Schistocytes PRESENT    Tear Drop Cells PRESENT    Polychromasia PRESENT     Comment: Performed at [facility], [place] [address] [place], [place] [address]  Bilirubin, direct     Status: Abnormal   Collection Time: 08/28/21  9:07 AM  Result Value Ref Range   Bilirubin, Direct 0.6 (H) 0.0 - 0.2 mg/dL    Comment: Performed at [facility], [place] [address] [place], [place] [address]  Resp Panel by RT-PCR (Flu A&B, Covid) Nasopharyngeal Swab     Status: None   Collection Time: 08/28/21  9:07 AM   Specimen: Nasopharyngeal Swab; Nasopharyngeal(NP) swabs in vial transport medium  Result Value Ref Range   SARS Coronavirus 2 by RT PCR NEGATIVE NEGATIVE    Comment: (NOTE) SARS-CoV-2 target nucleic acids are NOT DETECTED.  The SARS-CoV-2 RNA is generally detectable in upper respiratory specimens during the acute phase of infection. The lowest concentration of SARS-CoV-2 viral copies this assay can detect is 138 copies/mL. A negative result does not preclude SARS-Cov-2 infection and should not be used as the sole basis for treatment or other patient management decisions. A negative result may occur with  improper specimen collection/handling, submission of specimen other than nasopharyngeal swab, presence of viral mutation(s) within the areas targeted by this assay, and inadequate  number of viral copies(<138 copies/mL). A negative result must be combined with clinical observations, patient history, and epidemiological information. The expected result is Negative.  Fact Sheet for Patients:  EntrepreneurPulse.com.au  Fact Sheet for Healthcare Providers:  IncredibleEmployment.be  This test is no t yet approved or cleared by the [place] FDA and  has been authorized for detection and/or diagnosis of SARS-CoV-2 by FDA under an Emergency Use Authorization (EUA). This EUA will remain  in effect (meaning this test can be used) for the duration of the COVID-19 declaration under Section 564(b)(1) of the Act, 21 U.S.C.section 360bbb-3(b)(1), unless the authorization is terminated  or revoked sooner.       Influenza A by PCR NEGATIVE NEGATIVE   Influenza B by PCR NEGATIVE NEGATIVE    Comment: (NOTE) The Xpert Xpress SARS-CoV-2/FLU/RSV plus assay is intended as an aid in the diagnosis of influenza from Nasopharyngeal swab specimens and should not be used as a sole basis for treatment. Nasal washings and aspirates are unacceptable for Xpert Xpress SARS-CoV-2/FLU/RSV testing.  Fact Sheet for Patients: EntrepreneurPulse.com.au  Fact Sheet for Healthcare Providers: IncredibleEmployment.be  This test is not yet approved or cleared by the [place] FDA and has been authorized for detection and/or diagnosis of SARS-CoV-2 by FDA under an Emergency Use Authorization (EUA). This EUA will remain in effect (meaning this test can be used) for the duration of the COVID-19 declaration under Section 564(b)(1) of the Act, 21 U.S.C. section 360bbb-3(b)(1), unless the authorization is terminated or revoked.  Performed at [facility], [place] [address], [place], [place] [address]    CT Abdomen Pelvis W Contrast  Result Date: 08/26/2021 CLINICAL DATA:  Abdominal pain. EXAM: CT  ABDOMEN AND PELVIS WITH CONTRAST TECHNIQUE: Multidetector CT imaging of the abdomen and pelvis was performed using the standard protocol following bolus administration of intravenous contrast. CONTRAST:  51mL OMNIPAQUE IOHEXOL 350 MG/ML SOLN COMPARISON:  07/02/2021 FINDINGS: Lower chest: Unremarkable. Hepatobiliary: 13 mm hypoattenuating lesion in the posterior right liver is similar to prior and was characterized  as hemangioma on MRI 10/03/2020. No arterial phase hyperenhancement within the liver parenchyma. Multiple gallstones again noted. No intrahepatic or extrahepatic biliary dilation. Pancreas: No focal mass lesion. No dilatation of the main duct. No intraparenchymal cyst. No peripancreatic edema. Spleen: Marked splenomegaly with extensive venous collateralization in the left abdomen. Adrenals/Urinary Tract: No adrenal nodule or mass. Kidneys unremarkable. No evidence for hydroureter. The urinary bladder appears normal for the degree of distention. Stomach/Bowel: Stomach is unremarkable. No gastric wall thickening. No evidence of outlet obstruction. Duodenum is normally positioned as is the ligament of Treitz. No small bowel wall thickening. No small bowel dilatation. The terminal ileum is normal. The appendix is not well visualized, but there is no edema or inflammation in the region of the cecum. No gross colonic mass. No colonic wall thickening. Vascular/Lymphatic: No abdominal aortic aneurysm. Chronic portal vein occlusion again noted with extensive venous collateralization in the left abdomen suggesting spleno renal shunt. 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 There is no gastrohepatic or hepatoduodenal ligament lymphadenopathy. No retroperitoneal or mesenteric lymphadenopathy. No pelvic sidewall lymphadenopathy. Reproductive: Mass lesion in the uterine fundus is likely a fibroid.  There is no adnexal mass. Other: Small volume free fluid in the cul-de-sac. Stable appearance of mesenteric congestion and trace fluid in the right paracolic gutter. Musculoskeletal: No worrisome lytic or sclerotic osseous abnormality. IMPRESSION: 1. No acute findings in the abdomen or pelvis. Specifically, no findings to explain the patient's history of abdominal pain. 2. 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 Interventional radiology consultation recommended. 3. Chronic portal vein occlusion with extensive venous collateralization in the left abdomen suggesting spleno renal shunt. 4. Cirrhosis. 5. Cholelithiasis. 6. Small volume free fluid in the cul-de-sac. 7. Uterine fibroid. 8. Stable 13 mm hypoattenuating lesion in the posterior right liver was characterized as hemangioma on MRI 10/03/2020. Electronically Signed   By: Misty Stanley M.D.   On: 08/26/2021 15:46      Assessment/Plan Abdominal pain/nausea Ardine Eng B cirrhosis Chronic portal vein occlusion with collateralization Cholelithiasis:  on CT 10/25 without findings or signs/symptoms suspicious for cholecystitis. Cholelithiasis present on imaging in 2021 as well - do not think cholelithiasis is the source of her current symptoms and may be attributable to cirrhosis. Should this change or concern for cholecystitis arise then she would need further care at a tertiary care center with hepatology available - recommend continued pain management as per PCP with addition of heating pad and ibuprofen if cleared from PCP (remote history of peptic ulcer). We discussed importance of hydration and constipation management. GI and/or hepatology referral would likely be helpful - she has seen Dr. Hilarie Fredrickson with [facility] in the past   Winferd Humphrey, [facility] Surgery 08/28/2021, 10:27 AM Please see Amion for pager number during day hours  7:00am-4:30pm

## 2021-08-28 NOTE — Telephone Encounter (Signed)
Called last night re ab pain ct scan 08/26/21 abnormal rec f/u with PCP and if ab pain worse go to [facility] or [facility]

## 2021-08-28 NOTE — Telephone Encounter (Signed)
Patient went to [place].    Caller states his wife was seen today and put in a call for an emergency gallbladder surgery. Pt also had pain medication called in. Pt took it at 4pm. Pt is having severe abdominal pain. Translation No Nurse Assessment Nurse: Micki Riley, RN, Domenick Gong Date/Time (Eastern Time): 08/27/2021 9:31:08 PM Confirm and document reason for call. If symptomatic, describe symptoms. ---Caller states wife was seen earlier today, had an emergency gallbladder surgery consult called- has not heard from anyone yet. States pain medication was taken at 4pm, is rx every 8hrs. States she is not with patient right now, is going to pick her up to take her to the hospital- caller adding patient to this call. Pain right now is 9/10 constant . Denies fever or any other symptoms- feels warm to the touch. Was advised to go to ED if having severe pain, unsure which facility to go to (caller states '[facility] can't do surgery'.). Does the patient have any new or worsening symptoms? ---Yes Will a triage be completed? ---Yes Related visit to physician within the last 2 weeks? ---Yes Does the PT have any chronic conditions? (i.e. diabetes, asthma, this includes High risk factors for pregnancy, etc.) ---Unknown Is this a behavioral health or substance abuse call? ---No PLEASE NOTE: All timestamps contained within this report are represented as [place] Standard Time. CONFIDENTIALTY NOTICE: This fax transmission is intended only for the addressee. It contains information that is legally privileged, confidential or otherwise protected from use or disclosure. If you are not the intended recipient, you are strictly prohibited from reviewing, disclosing, copying using or disseminating any of this information or taking any action in reliance on or regarding this information. If you have received this fax in error, please notify us immediately by telephone so that we can arrange for its  return to [place]. Phone: [phone], Toll-Free: [phone], Fax: [phone] Page: 2 of 2 Call Id: [id number] Guidelines Guideline Title Affirmed Question Affirmed Notes Nurse Date/Time Eilene Ghazi Time) Abdominal Pain - Female [1] SEVERE pain (e.g., excruciating) AND [2] present > 1 hour Cazares, RN, Domenick Gong 08/27/2021 9:31:47 PM Disp. Time Eilene Ghazi Time) Disposition Final User 08/27/2021 9:27:57 PM Send to Urgent Queue Elveria Royals 08/27/2021 9:39:50 PM Called On-Call Provider Micki Riley, RN, Vivien Rota Janie 08/27/2021 9:37:36 PM Go to ED Now Yes Micki Riley, RN, Domenick Gong Caller Disagree/Comply Comply Caller Understands Yes PreDisposition InappropriateToAsk Care Advice Given Per Guideline GO TO ED NOW: ANOTHER ADULT SHOULD DRIVE: NOTHING BY MOUTH: BRING MEDICINES: CARE ADVICE given per Abdominal Pain, Female (Adult) guideline. Referrals [facility] Paging [facility] Phone DateTime Result/ Outcome Message Type Notes Tonita Phoenix Olivia Mackie [phone] 08/27/2021 9:39:50 PM Called On Call Provider - Reached Doctor Paged [place], Olivia Mackie 08/27/2021 9:42:40 PM Spoke with On Call - General Message Result Md aware. States for patient to go to either [facility] or [facility],& iif needs to call ambulance she can do that as we

## 2021-08-29 ENCOUNTER — Telehealth: Payer: Self-pay

## 2021-08-29 NOTE — Telephone Encounter (Signed)
Noted and agreed, thank you. 

## 2021-08-29 NOTE — Telephone Encounter (Signed)
..   Encourage patient to contact the pharmacy for refills or they can request refills through [place]:  08/27/2021  NEXT APPOINTMENT DATE:na  MEDICATION: hydrocodone  Is the patient out of medication? States she does not have enough to get her through the weekend.  PHARMACY:[facility]  Let patient know to contact pharmacy at the end of the day to make sure medication is ready.  Please notify patient to allow 48-72 hours to process  CLINICAL FILLS OUT ALL BELOW:   LAST REFILL:  QTY:  REFILL DATE:    OTHER COMMENTS:  I have informed patient that [facility] does not need to see her back until after her specialty appts.  Patient states she will probably not be able to make her appts b/c she stays doubled over in pain.  I have advised patient to go back to the ED if she is in that much pain.  Patient stated she understood.    Okay for refill?  Please advise

## 2021-09-01 ENCOUNTER — Other Ambulatory Visit: Payer: Self-pay

## 2021-09-01 DIAGNOSIS — D7581 Myelofibrosis: Secondary | ICD-10-CM

## 2021-09-01 DIAGNOSIS — K7469 Other cirrhosis of liver: Secondary | ICD-10-CM

## 2021-09-01 NOTE — Telephone Encounter (Signed)
Patient notified and voices understanding 

## 2021-09-01 NOTE — Progress Notes (Signed)
Subjective:    Patient ID: Anell Barr, female    DOB: 1951/10/28, 70 y.o.   MRN: [id number]  Chief Complaint  Patient presents with   Follow-up    HPI  RUQ Pain   Ms. Sandria Manly presents for ED f/u on 08/27/21.   hortly after our own visit on 08/27/21, Ms. Boylan presented to the ED with c/o worsening nausea and vomiting as well as sharp RUQ pain. Stated she had taken Norco 10-325 mg to manage pain, but it only provided temporary relief. After consulting general surgery and evaluating her recent CT scan, it was concluded cholelithiasis was not the likely source of her then current sx. Instead they believed sx to be more consistent with those of cirrhosis.    Currently she is taking the Norco 10-325 mg at night and phenergan 12.5 mg as needed, mainly in the morning with no adverse effects. She has found these medications helpful in managing her pain and nausea. Following her ED visit, Mrs. Andree Moro expresses interest in receiving a referral to a hepatologist to rule out all possible issues.   No changes in her symptoms from last visit. Pain is more diffuse in her abdomen today. States she cannot sleep without the Norco at night because of the pain. No new symptoms.  Past Medical History:  Diagnosis Date   Cirrhosis ([place])    Colon polyps    Depression    Esophageal varices (HCC)    Gastritis    Internal hemorrhoids    Migraine    Portal hypertensive gastropathy (HCC)    PVT (portal vein thrombosis)     Past Surgical History:  Procedure Laterality Date   LIVER BIOPSY      Family History  Problem Relation Age of Onset   Cancer - Lung Father    Diabetes Mellitus II Maternal Aunt    Stomach cancer Maternal Aunt    Diabetes Mellitus II Maternal Uncle    Colon cancer Neg Hx    Rectal cancer Neg Hx    Esophageal cancer Neg Hx    Liver disease Neg Hx    Pancreatic cancer Neg Hx     Social History   Tobacco Use   Smoking status: Former    Types: Cigarettes    Smokeless tobacco: Never  Vaping Use   Vaping Use: Never used  Substance Use Topics   Alcohol use: Not Currently    Comment: Occasionally-1 acoholic beverage every 1-2 weeks   Drug use: No     No Known Allergies  Review of Systems REFER TO HPI FOR PERTINENT POSITIVES AND NEGATIVES      Objective:     BP (!) 102/57   Pulse 74   Temp 98.1 F (36.7 C)   Ht 5\' 6"  (1.676 m)   Wt 181 lb (82.1 kg)   SpO2 97%   BMI 29.21 kg/m   Wt Readings from Last 3 Encounters:  09/02/21 181 lb (82.1 kg)  08/28/21 180 lb (81.6 kg)  08/26/21 181 lb 3.2 oz (82.2 kg)    BP Readings from Last 3 Encounters:  09/02/21 (!) 102/57  08/28/21 122/64  08/26/21 133/73     Physical Exam Vitals and nursing note reviewed.  Constitutional:      Appearance: Normal appearance. She is normal weight.  HENT:     Head: Normocephalic and atraumatic.  Eyes:     Extraocular Movements: Extraocular movements intact.     Pupils: Pupils are equal, round, and reactive to light.  Cardiovascular:     Rate and Rhythm: Normal rate and regular rhythm.     Heart sounds: Murmur heard.  Systolic murmur is present with a grade of 4/6.  Pulmonary:     Effort: Pulmonary effort is normal.     Breath sounds: Normal breath sounds.  Abdominal:     General: Bowel sounds are normal.     Palpations: There is hepatomegaly and splenomegaly.     Tenderness: There is generalized abdominal tenderness. There is no right CVA tenderness, left CVA tenderness, guarding or rebound. Negative signs include Murphy's sign, Rovsing's sign, McBurney's sign and obturator sign.  Musculoskeletal:     Right lower leg: No edema.     Left lower leg: No edema.     Comments: LE swelling has slightly decreased   Skin:    General: Skin is warm and dry.     Comments: Generalized jaundiced appearance, no scleral icterus; baseline for patient  Neurological:     Mental Status: She is alert.  Psychiatric:        Mood and Affect: Affect normal.  Mood is anxious and depressed.       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    Dispense:  30 tablet    Refill:  0   HYDROcodone-acetaminophen (NORCO) 10-325 MG tablet    Sig: Take 1 tablet by mouth every 8 (eight) hours as needed for up to 10 days.    Dispense:  30 tablet    Refill:  0    1. Myelofibrosis ([place]) 2. Other cirrhosis of liver ([place]) 3. Generalized abdominal pain 4. Nausea 5. Primary insomnia -I personally reviewed her ED summary from 08/28/21. General surgery consulted, who felt like her symptoms were from her cirrhosis and not from gallstones. -Her symptoms are stable, but she continues to complain of pain and nausea.  Patient states that she cannot sleep without the Norco at night. -I agreed to send 30 tablets of the Norco for her to take as needed at night during the day for severe pain.  I also refilled the Phenergan 12.5 mg to take as needed for nausea. She knows not to drive with these medications. -We discovered that the hepatology referral that we had been previously sent was never received, as their office does not accept electronic referrals.  Apologized for this lack of knowledge and corrected this mistake today.  We will resend hepatology referral via fax today. -She is going to follow-up with cardiology today and she is also going to follow-up with GI tomorrow. -She is aware of red flags and when / if to return to the emergency department. -She will continue to follow-up with her oncologist Dr. Joan Mayans as well.  This note was prepared with assistance of Actor. Occasional wrong-word or sound-a-like substitutions may have occurred due to the inherent limitations of voice recognition software.  Time Spent: 31 minutes  of total time was spent on the date of the encounter performing the following actions: chart review prior to seeing the patient, obtaining history, performing a medically necessary exam, counseling on the treatment plan, placing orders, and documenting in our EHR.     I,Havlyn C Ratchford,acting as a scribe for [employer], PA-C.,have documented all relevant documentation on the behalf of Lucrezia Dehne M Renesmae Donahey, PA-C,as directed by  [employer], PA-C while in the presence of Lillyana Majette M Traeson Dusza, PA-C.  I, Elai Vanwyk M Hugo Lybrand, PA-C, have reviewed all documentation for this visit. The documentation on 09/02/21 for the exam, diagnosis, procedures, and orders are all accurate and complete.

## 2021-09-01 NOTE — Telephone Encounter (Signed)
Patient notified

## 2021-09-02 ENCOUNTER — Other Ambulatory Visit: Payer: Self-pay

## 2021-09-02 ENCOUNTER — Ambulatory Visit (HOSPITAL_COMMUNITY): Payer: Medicare PPO | Attending: Cardiology

## 2021-09-02 ENCOUNTER — Encounter: Payer: Self-pay | Admitting: Physician Assistant

## 2021-09-02 ENCOUNTER — Ambulatory Visit: Payer: Medicare PPO | Admitting: Internal Medicine

## 2021-09-02 ENCOUNTER — Ambulatory Visit: Payer: Medicare PPO | Admitting: Physician Assistant

## 2021-09-02 VITALS — BP 102/57 | HR 74 | Temp 98.1°F | Ht 66.0 in | Wt 181.0 lb

## 2021-09-02 DIAGNOSIS — K7469 Other cirrhosis of liver: Secondary | ICD-10-CM

## 2021-09-02 DIAGNOSIS — R1084 Generalized abdominal pain: Secondary | ICD-10-CM | POA: Diagnosis not present

## 2021-09-02 DIAGNOSIS — Z87891 Personal history of nicotine dependence: Secondary | ICD-10-CM | POA: Diagnosis not present

## 2021-09-02 DIAGNOSIS — F5101 Primary insomnia: Secondary | ICD-10-CM | POA: Diagnosis not present

## 2021-09-02 DIAGNOSIS — D7581 Myelofibrosis: Secondary | ICD-10-CM

## 2021-09-02 DIAGNOSIS — I3481 Nonrheumatic mitral (valve) annulus calcification: Secondary | ICD-10-CM | POA: Insufficient documentation

## 2021-09-02 DIAGNOSIS — R609 Edema, unspecified: Secondary | ICD-10-CM | POA: Insufficient documentation

## 2021-09-02 DIAGNOSIS — I5031 Acute diastolic (congestive) heart failure: Secondary | ICD-10-CM

## 2021-09-02 DIAGNOSIS — R110 Nausea: Secondary | ICD-10-CM

## 2021-09-02 LAB — ECHOCARDIOGRAM COMPLETE
AR max vel: 3.33 cm2
AV Area VTI: 3.23 cm2
AV Area mean vel: 3.25 cm2
AV Mean grad: 17 mmHg
AV Peak grad: 27.8 mmHg
Ao pk vel: 2.64 m/s
Area-P 1/2: 3.07 cm2
Height: 66 in
MV VTI: 2.48 cm2
S' Lateral: 3.1 cm
Weight: 2896 oz

## 2021-09-02 MED ORDER — HYDROCODONE-ACETAMINOPHEN 10-325 MG PO TABS: 1.0000 | ORAL_TABLET | Freq: Three times a day (TID) | ORAL | 0 refills | Status: AC | PRN

## 2021-09-02 MED ORDER — PROMETHAZINE HCL 12.5 MG PO TABS: 12.5000 mg | ORAL_TABLET | Freq: Three times a day (TID) | ORAL | 0 refills | Status: DC | PRN

## 2021-09-02 NOTE — Patient Instructions (Addendum)
-  Keep f/up as scheduled with GI tomorrow -I have refilled the phenergan to take as needed every 8 hours for nausea -You may take Norco every 8 hours prn severe pain. Again, this is not a long term solution, but something to use short-term as you continue to see specialists for further evaluation and solutions.  -My CMA is working on referral to hepatology -Back to ED if any sudden severe worsening symptoms or changes such as fevers

## 2021-09-03 ENCOUNTER — Ambulatory Visit: Payer: Medicare PPO | Admitting: Internal Medicine

## 2021-09-03 ENCOUNTER — Encounter: Payer: Self-pay | Admitting: Physician Assistant

## 2021-09-03 ENCOUNTER — Encounter: Payer: Self-pay | Admitting: Internal Medicine

## 2021-09-03 ENCOUNTER — Ambulatory Visit: Payer: Medicare PPO | Admitting: Physician Assistant

## 2021-09-03 ENCOUNTER — Other Ambulatory Visit (INDEPENDENT_AMBULATORY_CARE_PROVIDER_SITE_OTHER): Payer: Medicare PPO

## 2021-09-03 VITALS — BP 102/52 | HR 79 | Ht 66.0 in | Wt 182.8 lb

## 2021-09-03 VITALS — BP 104/50 | HR 70 | Ht 66.0 in | Wt 183.0 lb

## 2021-09-03 DIAGNOSIS — I81 Portal vein thrombosis: Secondary | ICD-10-CM

## 2021-09-03 DIAGNOSIS — R1033 Periumbilical pain: Secondary | ICD-10-CM

## 2021-09-03 DIAGNOSIS — I728 Aneurysm of other specified arteries: Secondary | ICD-10-CM | POA: Diagnosis not present

## 2021-09-03 DIAGNOSIS — D7581 Myelofibrosis: Secondary | ICD-10-CM | POA: Diagnosis not present

## 2021-09-03 DIAGNOSIS — K7460 Unspecified cirrhosis of liver: Secondary | ICD-10-CM

## 2021-09-03 DIAGNOSIS — M7989 Other specified soft tissue disorders: Secondary | ICD-10-CM | POA: Diagnosis not present

## 2021-09-03 LAB — CBC WITH DIFFERENTIAL/PLATELET
Basophils Absolute: 0.1 10*3/uL (ref 0.0–0.1)
Basophils Relative: 1.4 % (ref 0.0–3.0)
Eosinophils Absolute: 0.2 10*3/uL (ref 0.0–0.7)
Eosinophils Relative: 2.7 % (ref 0.0–5.0)
HCT: 26 % — ABNORMAL LOW (ref 36.0–46.0)
Hemoglobin: 8.6 g/dL — ABNORMAL LOW (ref 12.0–15.0)
Lymphocytes Relative: 12.6 % (ref 12.0–46.0)
Lymphs Abs: 0.8 10*3/uL (ref 0.7–4.0)
MCHC: 33.2 g/dL (ref 30.0–36.0)
MCV: 87 fl (ref 78.0–100.0)
Monocytes Absolute: 0.4 10*3/uL (ref 0.1–1.0)
Monocytes Relative: 5.4 % (ref 3.0–12.0)
Neutro Abs: 5.2 10*3/uL (ref 1.4–7.7)
Neutrophils Relative %: 77.9 % — ABNORMAL HIGH (ref 43.0–77.0)
Platelets: 230 10*3/uL (ref 150.0–400.0)
RBC: 2.98 Mil/uL — ABNORMAL LOW (ref 3.87–5.11)
RDW: 25.6 % — ABNORMAL HIGH (ref 11.5–15.5)
WBC: 6.7 10*3/uL (ref 4.0–10.5)

## 2021-09-03 LAB — COMPREHENSIVE METABOLIC PANEL
ALT: 15 U/L (ref 0–35)
AST: 21 U/L (ref 0–37)
Albumin: 3.3 g/dL — ABNORMAL LOW (ref 3.5–5.2)
Alkaline Phosphatase: 82 U/L (ref 39–117)
BUN: 13 mg/dL (ref 6–23)
CO2: 30 mEq/L (ref 19–32)
Calcium: 8.4 mg/dL (ref 8.4–10.5)
Chloride: 107 mEq/L (ref 96–112)
Creatinine, Ser: 0.61 mg/dL (ref 0.40–1.20)
GFR: 89.77 mL/min (ref >60.00)
Glucose, Bld: 98 mg/dL (ref 70–99)
Potassium: 4 mEq/L (ref 3.5–5.1)
Sodium: 140 mEq/L (ref 135–145)
Total Bilirubin: 4.7 mg/dL — ABNORMAL HIGH (ref 0.2–1.2)
Total Protein: 5.2 g/dL — ABNORMAL LOW (ref 6.0–8.3)

## 2021-09-03 LAB — PROTIME-INR
INR: 1.5 ratio — ABNORMAL HIGH (ref 0.8–1.0)
Prothrombin Time: 16.5 s — ABNORMAL HIGH (ref 9.6–13.1)

## 2021-09-03 LAB — AMMONIA: Ammonia: 87 umol/L — ABNORMAL HIGH (ref 11–35)

## 2021-09-03 NOTE — Patient Instructions (Signed)
If you are age 71 or older, your body mass index should be between 23-30. Your Body mass index is 29.54 kg/m. If this is out of the aforementioned range listed, please consider follow up with your Primary Care Provider. ________________________________________________________  The [facility] providers would like to encourage you to use [facility] to communicate with providers for non-urgent requests or questions.  Due to long hold times on the telephone, sending your provider a message by [facility] may be a faster and more efficient way to get a response.  Please allow 48 business hours for a response.  Please remember that this is for non-urgent requests.  _______________________________________________________  Your provider has requested that you go to the basement level for lab work before leaving today. Press "B" on the elevator. The lab is located at the first door on the left as you exit the elevator.  You have been scheduled for an MRI at [facility] on 09/15/2021. Your appointment time is 12:00 pm. Please arrive to admitting (at main entrance of the hospital) 30 minutes prior to your appointment time for registration purposes. Please make certain not to have anything to eat or drink 6 hours prior to your test. In addition, if you have any metal in your body, have a pacemaker or defibrillator, please be sure to let your ordering physician know. This test typically takes 45 minutes to 1 hour to complete. Should you need to reschedule, please call [phone] to do so.  Continue Ondansetron 4 mg every 6 hours as needed for nausea  Continue Furosemide 20 mg twice daily.  You will be Contacted by [facility] for your Interventional Radiology Consult. Expect a phone call from [phone].  Follow up pending at this time.  Thank you for entrusting me with your care and choosing [facility].  Amy Esterwood, PA-C

## 2021-09-03 NOTE — Patient Instructions (Addendum)
Medication Instructions:  CONSIDER TAKING LASIX DAILY AS PRESCRIBED IF SPIRONOLACTONE REQUIRED BY GI- THIS IS OKAY FROM A CARDIAC PERSPECTIVE  *If you need a refill on your cardiac medications before your next appointment, please call your pharmacy*  Follow-Up: At [facility], you and your health needs are our priority.  As part of our continuing mission to provide you with exceptional heart care, we have created designated Provider Care Teams.  These Care Teams include your primary Cardiologist (physician) and Advanced Practice Providers (APPs -  Physician Assistants and Nurse Practitioners) who all work together to provide you with the care you need, when you need it.  Your next appointment:   6 month(s)  The format for your next appointment:   In Person  Provider:   Cherlynn Kaiser, MD

## 2021-09-03 NOTE — Progress Notes (Signed)
Subjective:    Patient ID: Courtney Buckley, female    DOB: 1950/11/28, 70 y.o.   MRN: [id number]  HPI Yocheved is a pleasant 70 year old white female, established with Dr. Hilarie Fredrickson, who was last seen in our office in July 2021.  She comes in today with new onset of abdominal pain which has been present over the past 6 weeks and associated with nausea occasional vomiting and dry heaves. 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 Colonoscopy February 2020 with removal of 1 5 mm polyp which was inflammatory  Patient says that she had onset of the abdominal pain about 6 weeks ago and says that its been pretty intense from onset and has not necessarily progressed since then.  She describes it as a sharp pain present in the mid lower abdomen and around the umbilicus.  She feels that the pain is usually worse at night and keeps her from sleeping though it is present throughout the day as well.  She says she may get some hours during the daytime but its not bothering her as bad.  She has been requiring hydrocodone specially at night to help her sleep.  Sometimes she thinks she feels a bit better after eating but has had a lot of problems with nausea and intermittent dry heaves.  Her bowel movements have been more constipated since requiring pain medication she has not noted any melena or hematochezia.  No fever or chills. Appetite has been somewhat decreased. She has not noticed any change in her pain with activity.  She does say that at times the pain will radiate around bilaterally into her back. She is using as needed ibuprofen. She was seen in the ER  on 08/28/2021 due to the same pain.  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 She was seen by surgery in consultation during that ER visit due to concerns for symptoms secondary to cholecystitis or symptomatic cholelithiasis.  Surgery did not feel that her symptoms were secondary to gallstones and felt more consistent with cirrhosis. Labs at that time showed WBC 7.5, hemoglobin 9.6/hematocrit 29.8/platelets 232 T bili 5.0 LFTs otherwise normal and lipase was normal.  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  All other review of systems was otherwise negative.   Outpatient Encounter Medications as of 09/03/2021  Medication Sig   furosemide (  LASIX) 20 MG tablet Take 1 tablet (20 mg total) by mouth 2 (two) times daily. (Patient taking differently: Take 20 mg by mouth as needed.)   HYDROcodone-acetaminophen (NORCO) 10-325 MG tablet Take 1 tablet by mouth every 8 (eight) hours as needed for up to 10 days.   JAKAFI 10 MG tablet Take 10 mg by mouth 2 (two) times daily.   Multiple Vitamin (MULTIVITAMIN  ADULT PO) Take by mouth.   [DISCONTINUED] promethazine (PHENERGAN) 12.5 MG tablet Take 1 tablet (12.5 mg total) by mouth every 8 (eight) hours as needed for nausea or vomiting.   [DISCONTINUED] zolpidem (AMBIEN) 5 MG tablet Take 1 tablet (5 mg total) by mouth at bedtime as needed for sleep.   No facility-administered encounter medications on file as of 09/03/2021.   No Known Allergies Patient Active Problem List   Diagnosis Date Noted   Myelofibrosis ([place]) 10/04/2020   Social History   Socioeconomic History   Marital status: Significant Other    Spouse name: Not on file   Number of children: 2   Years of education: Not on file   Highest education level: Not on file  Occupational History   Not on file  Tobacco Use   Smoking status: Former    Types: Cigarettes   Smokeless tobacco: Never  Vaping Use   Vaping Use: Never used  Substance and Sexual Activity   Alcohol use: Not Currently    Comment: Occasionally-1 acoholic beverage every 1-2 weeks   Drug use: No   Sexual activity: Not on file  Other Topics Concern   Not on file  Social History Narrative   Not on file   Social Determinants of Health   Financial Resource Strain: Low Risk    Difficulty of Paying Living Expenses: Not hard at all  Food Insecurity: No Food Insecurity   Worried About Charity fundraiser in the Last Year: Never true   [place] in the Last Year: Never true  Transportation Needs: No Transportation Needs   Lack of Transportation (Medical): No   Lack of Transportation (Non-Medical): No  Physical Activity: Insufficiently Active   Days of Exercise per Week: 3 days   Minutes of Exercise per Session: 20 min  Stress: No Stress Concern Present   Feeling of Stress : Only a little  Social Connections: Moderately Isolated   Frequency of Communication with Friends and Family: More than three times a week   Frequency of Social Gatherings with Friends and Family: More than three times a week   Attends  Religious Services: Never   Marine scientist or Organizations: No   Attends Archivist Meetings: Never   Marital Status: Married  Human resources officer Violence: Not At Risk   Fear of Current or Ex-Partner: No   Emotionally Abused: No   Physically Abused: No   Sexually Abused: No    Ms. Gelpi's family history includes Cancer - Lung in her father; Diabetes Mellitus II in her maternal aunt and maternal uncle; Stomach cancer in her maternal aunt.      Objective:    Vitals:   09/03/21 1458  BP: (!) 104/50  Pulse: 70    Physical Exam Well-developed well-nourished older white female in no acute distress.  Fatigued appearing, height, Weight, 183 BMI 29.5  HEENT; nontraumatic normocephalic, EOMI, PE R LA, sclera with Early icterus Oropharynx; not examined today Neck; supple, no JVD Cardiovascular; regular rate and rhythm with I5-O2, systolic murmur Pulmonary; Clear bilaterally Abdomen; soft,  no appreciable fluid wave, spleen is quite large palpable at the level of the umbilicus on the left , she is tender in the periumbilical area, nonfocal, no rebound, bowel sounds are active Rectal; not done today Skin; benign exam, mild jaundice Extremities; 2+ edema bilateral ankles Neuro/Psych; alert and oriented x4, grossly nonfocal mood and affect appropriate , occasional slight slurring with speech ,no asterixis       Assessment & Plan:   #7 70 year old white female with cryptogenic cirrhosis, chronic portal vein thrombosis, chronic splenomegaly and previously documented grade 2 esophageal varices (07/2020) who presents now with 6-week history of persistent mid and periumbilical abdominal pain described as sharp and intense at times, perhaps worse at night and unable to sleep.  Associated with intermittent nausea/dry heaves  Etiology of current symptoms has been unclear.  She had ER evaluation on 2 consecutive days 10/25 and 08/27/2021 and surgical consultation during ER  visit with concerns for symptomatic cholelithiasis and/or possible cholecystitis.  Surgery did not feel symptoms secondary to her gallbladder or gallstones.  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  I suspect her symptoms may be vascular in etiology secondary to the splenic artery aneurysm  , No evidence for hepatic neoplasm by CT No significant ascites to suggest SBP  #2 Marked splenomegaly #3 myelodysplastic syndrome/myelofibrosis-on Jakafi #4 portal hypertension with varices as outlined above and portal gastropathy #5 normocytic anemia  Plan; CBC with differential, c-Met, pro time/INR, venous ammonia, AFP Continue Zofran 4 mg every 6 hours as needed for nausea Patient has prescription for hydrocodone to use as needed for abdominal pain Will arrange for IR consultation as soon as possible for the splenic artery aneurysm Schedule for MRI of the abdomen/pelvis with and without to be done within the week  Patient was advised that if she has any increase in intensity of her abdominal pain or any worsening of other symptoms that she should return to the emergency room, and at that point will need to be admitted.  Seema Blum S Ethaniel Garfield PA-C 09/03/2021   Cc: Allwardt, Randa Evens, PA-C

## 2021-09-03 NOTE — Progress Notes (Signed)
Cardiology Office Note:    Date:  09/03/2021   ID:  Anell Barr, DOB Aug 26, 1951, MRN [id number]  PCP:  Fredirick Lathe, PA-C  Cardiologist:  None  Electrophysiologist:  None   Referring MD: Fredirick Lathe, PA-C   Chief Complaint/Reason for Referral: LE edema  History of Present Illness:    Courtney Buckley is a 70 y.o. female with a history of cirrhosis, portal vein thrombosis, esophageal varices, myelofibrosis, and splenic artery aneurysm, and no significant past cardiovascular history who presents at the request of Allwardt, Randa Evens, PA-C for evaluation of LE edema.  She plans to see GI later today for continued abdominal pain, and we coordinated her echocardiogram so that it was performed prior to our visit today. She has had two normal echocardiograms this year, fortunately this reassures that her LE edema is unlikely to be related to CHF. We discussed that diuretic therapy is likely indicated to control swelling which has helped thusfar, and her gastroenterologist may recommend spironolactone in setting of cirrhosis, which would be an appropriate choice with no cardiovascular contraindication noted thusfar.  Aside from LE edema, she has had no chest pain or significant SOB. The patient denies, palpitations, PND, orthopnea. Denies cough, fever, chills. Denies nausea, vomiting. Denies syncope or presyncope. Denies dizziness or lightheadedness.  Past Medical History:  Diagnosis Date   Cirrhosis ([place])    Colon polyps    Depression    Esophageal varices (HCC)    Gastritis    Internal hemorrhoids    Migraine    Portal hypertensive gastropathy (HCC)    PVT (portal vein thrombosis)     Past Surgical History:  Procedure Laterality Date   LIVER BIOPSY      Current Medications: Current Meds  Medication Sig   furosemide (LASIX) 20 MG tablet Take 1 tablet (20 mg total) by mouth 2 (two) times daily. (Patient taking differently: Take 20 mg by mouth as needed.)    HYDROcodone-acetaminophen (NORCO) 10-325 MG tablet Take 1 tablet by mouth every 8 (eight) hours as needed for up to 10 days.   JAKAFI 10 MG tablet Take 10 mg by mouth 2 (two) times daily.   Multiple Vitamin (MULTIVITAMIN ADULT PO) Take by mouth.   promethazine (PHENERGAN) 12.5 MG tablet Take 1 tablet (12.5 mg total) by mouth every 8 (eight) hours as needed for nausea or vomiting.     Allergies:   Patient has no known allergies.   Social History   Tobacco Use   Smoking status: Former    Types: Cigarettes   Smokeless tobacco: Never  Vaping Use   Vaping Use: Never used  Substance Use Topics   Alcohol use: Not Currently    Comment: Occasionally-1 acoholic beverage every 1-2 weeks   Drug use: No     Family History: The patient's family history includes Cancer - Lung in her father; Diabetes Mellitus II in her maternal aunt and maternal uncle; Stomach cancer in her maternal aunt. There is no history of Colon cancer, Rectal cancer, Esophageal cancer, Liver disease, or Pancreatic cancer.  ROS:   Please see the history of present illness.    All other systems reviewed and are negative.  EKGs/Labs/Other Studies Reviewed:    The following studies were reviewed today:  EKG:  NSR, mild LVH, poor R wave progression in anterior leads  Imaging studies that I have independently reviewed today: CT abd pelv 08/26/21 - mitral annular calcification seen. Coronaries incompletely visualized.  Recent Labs: 07/02/2021: B Natriuretic Peptide 226.9 08/27/2021:  ALT 24; BUN 16; Creatinine, Ser 0.47; Hemoglobin 9.6; Platelets 232; Potassium 3.7; Sodium 136  Recent Lipid Panel No results found for: CHOL, TRIG, HDL, CHOLHDL, VLDL, LDLCALC, LDLDIRECT  Physical Exam:    VS:  BP (!) 102/52   Pulse 79   Ht 5\' 6"  (1.676 m)   Wt 182 lb 12.8 oz (82.9 kg)   SpO2 96%   BMI 29.50 kg/m     Wt Readings from Last 5 Encounters:  09/03/21 182 lb 12.8 oz (82.9 kg)  09/02/21 181 lb (82.1 kg)  08/28/21 180 lb  (81.6 kg)  08/26/21 181 lb 3.2 oz (82.2 kg)  07/14/21 180 lb 3.2 oz (81.7 kg)    Constitutional: No acute distress Eyes: sclera non-icteric, normal conjunctiva and lids ENMT: normal dentition, moist mucous membranes Cardiovascular: regular rhythm, normal rate, no murmur. S1 and S2 normal. No jugular venous distention.  Respiratory: clear to auscultation bilaterally GI : normal bowel sounds, soft and nontender. No distention.   MSK: extremities warm, well perfused. 1+ bilateral LE edema.  NEURO: grossly nonfocal exam, moves all extremities. PSYCH: alert and oriented x 3, normal mood and affect.   ASSESSMENT:    1. Swelling of both lower extremities   2. Splenic artery aneurysm ([place])   3. Myelofibrosis ([place])   4. Hepatic cirrhosis, unspecified hepatic cirrhosis type, unspecified whether ascites present (HCC)    PLAN:    Swelling of both lower extremities - Plan: EKG 12-Lead  Splenic artery aneurysm (HCC)  Myelofibrosis (HCC)  Hepatic cirrhosis, unspecified hepatic cirrhosis type, unspecified whether ascites present [facility])  She has been prescribed lasix, and since this is helpful she can take daily. No worrisome findings on echocardiogram x 2 performed this year. No worrisome exertional symptoms to suggest need for ischemic workup at this time. If concerns arise, we will consider and she may do well to have a coronary CTA to evaluate further. I will see her back in 6 months to reevaluate if cardiovascular symptoms develop. She and I discussed red flag symptoms to present to the ER from a cardiovascular perspective. Consider compression socks as well.   Cherlynn Kaiser, MD, [place]   Shared Decision Making/Informed Consent:       Medication Adjustments/Labs and Tests Ordered: Current medicines are reviewed at length with the patient today.  Concerns regarding medicines are outlined above.   Orders Placed This Encounter  Procedures   EKG 12-Lead     No orders of the defined types were placed in this encounter.   Patient Instructions  Medication Instructions:  CONSIDER TAKING LASIX DAILY AS PRESCRIBED IF SPIRONOLACTONE REQUIRED BY GI- THIS IS OKAY FROM A CARDIAC PERSPECTIVE  *If you need a refill on your cardiac medications before your next appointment, please call your pharmacy*  Follow-Up: At [facility], you and your health needs are our priority.  As part of our continuing mission to provide you with exceptional heart care, we have created designated Provider Care Teams.  These Care Teams include your primary Cardiologist (physician) and Advanced Practice Providers (APPs -  Physician Assistants and Nurse Practitioners) who all work together to provide you with the care you need, when you need it.  Your next appointment:   6 month(s)  The format for your next appointment:   In Person  Provider:   Cherlynn Kaiser, MD

## 2021-09-04 LAB — AFP TUMOR MARKER: AFP-Tumor Marker: 3.3 ng/mL

## 2021-09-04 NOTE — Progress Notes (Signed)
Addendum: Reviewed and agree with assessment and management plan. Laquenta Whitsell M, MD  

## 2021-09-05 ENCOUNTER — Other Ambulatory Visit: Payer: Self-pay

## 2021-09-05 MED ORDER — LACTULOSE 10 GM/15ML PO SOLN: ORAL | 11 refills | Status: DC

## 2021-09-05 MED ORDER — RIFAXIMIN 550 MG PO TABS: 550.0000 mg | ORAL_TABLET | Freq: Two times a day (BID) | ORAL | 11 refills | Status: DC

## 2021-09-09 ENCOUNTER — Ambulatory Visit
Admission: RE | Admit: 2021-09-09 | Discharge: 2021-09-09 | Disposition: A | Discharge status: Home / Self Care | Payer: Medicare PPO | Source: Ambulatory Visit | Attending: Physician Assistant | Admitting: Physician Assistant

## 2021-09-09 ENCOUNTER — Other Ambulatory Visit (HOSPITAL_COMMUNITY): Payer: Self-pay | Admitting: Interventional Radiology

## 2021-09-09 ENCOUNTER — Encounter: Payer: Self-pay | Admitting: *Deleted

## 2021-09-09 ENCOUNTER — Telehealth: Payer: Self-pay

## 2021-09-09 DIAGNOSIS — I728 Aneurysm of other specified arteries: Secondary | ICD-10-CM | POA: Diagnosis not present

## 2021-09-09 DIAGNOSIS — R1033 Periumbilical pain: Secondary | ICD-10-CM

## 2021-09-09 HISTORY — PX: IR RADIOLOGIST EVAL & MGMT: IMG5224

## 2021-09-09 NOTE — Consult Note (Addendum)
Chief Complaint: Patient was seen in consultation today for splenic artery aneurysm at the request of Nicoletta Ba S  Referring Physician(s): Nicoletta Ba S  History of Present Illness: Courtney Buckley is a 70 y.o. female with a history of chronic portal vein thrombosis with cavernous transformation and marked splenorenal portosystemic collateralization in addition to JAK 2 mutation myelofibrosis and splenomegaly.  She is under the care of oncology (Dr. Chryl Heck).  Recent abdominal imaging for abdominal pain demonstrated an enlarging splenic artery aneurysm.  CT imaging from October 2022 demonstrates the aneurysm to measure 3.3 x 2.9 cm which is definitively larger compared to 2.8 x 1.9 cm along similar dimensions from a prior MRI obtained in February 2020.  She has a history of early satiety and nagging left upper quadrant pain which has been attributed to her splenomegaly.  She is currently on Jakafi.  Interestingly, she reports a significant family history of aneurysm disease.  Her mother had intracranial arterial aneurysms requiring clipping and embolization and her sister had a popliteal artery aneurysm.   Past Medical History:  Diagnosis Date   Cirrhosis ([place])    Colon polyps    Depression    Esophageal varices ([facility])    Gastritis    Internal hemorrhoids    Migraine    Portal hypertensive gastropathy ([facility])    PVT (portal vein thrombosis)     Past Surgical History:  Procedure Laterality Date   LIVER BIOPSY      Allergies: Patient has no known allergies.  Medications: Prior to Admission medications   Medication Sig Start Date End Date Taking? Authorizing Provider  furosemide (LASIX) 20 MG tablet Take 1 tablet (20 mg total) by mouth 2 (two) times daily. Patient taking differently: Take 20 mg by mouth as needed. 07/14/21 09/03/21  Allwardt, Randa Evens, PA-C  HYDROcodone-acetaminophen (NORCO) 10-325 MG tablet Take 1 tablet by mouth every 8 (eight) hours as needed for up  to 10 days. 09/02/21 09/12/21  Allwardt, Alyssa M, PA-C  JAKAFI 10 MG tablet Take 10 mg by mouth 2 (two) times daily. 06/11/21   [provider]  lactulose (CHRONULAC) 10 GM/15ML solution Take 45 ml daily 09/05/21   Esterwood, Amy S, PA-C  Multiple Vitamin (MULTIVITAMIN ADULT PO) Take by mouth.    [provider]  rifaximin (XIFAXAN) 550 MG TABS tablet Take 1 tablet (550 mg total) by mouth 2 (two) times daily. 09/05/21   Esterwood, Amy S, PA-C     Family History  Problem Relation Age of Onset   Cancer - Lung Father    Diabetes Mellitus II Maternal Aunt    Stomach cancer Maternal Aunt    Diabetes Mellitus II Maternal Uncle    Colon cancer Neg Hx    Rectal cancer Neg Hx    Esophageal cancer Neg Hx    Liver disease Neg Hx    Pancreatic cancer Neg Hx     Social History   Socioeconomic History   Marital status: Significant Other    Spouse name: Not on file   Number of children: 2   Years of education: Not on file   Highest education level: Not on file  Occupational History   Not on file  Tobacco Use   Smoking status: Former    Types: Cigarettes   Smokeless tobacco: Never  Vaping Use   Vaping Use: Never used  Substance and Sexual Activity   Alcohol use: Not Currently    Comment: Occasionally-1 acoholic beverage every 1-2 weeks   Drug use:  No   Sexual activity: Not on file  Other Topics Concern   Not on file  Social History Narrative   Not on file   Social Determinants of Health   Financial Resource Strain: Low Risk    Difficulty of Paying Living Expenses: Not hard at all  Food Insecurity: No Food Insecurity   Worried About Running Out of Food in the Last Year: Never true   Hunt in the Last Year: Never true  Transportation Needs: No Transportation Needs   Lack of Transportation (Medical): No   Lack of Transportation (Non-Medical): No  Physical Activity: Insufficiently Active   Days of Exercise per Week: 3 days   Minutes of Exercise per  Session: 20 min  Stress: No Stress Concern Present   Feeling of Stress : Only a little  Social Connections: Moderately Isolated   Frequency of Communication with Friends and Family: More than three times a week   Frequency of Social Gatherings with Friends and Family: More than three times a week   Attends Religious Services: Never   Marine scientist or Organizations: No   Attends Music therapist: Never   Marital Status: Married    ECOG Status: 1 - Symptomatic but completely ambulatory  Review of Systems: A 12 point ROS discussed and pertinent positives are indicated in the HPI above.  All other systems are negative.  Review of Systems  Vital Signs: BP (!) 109/59 (BP Location: Left Arm)   Pulse 74   SpO2 96%   Physical Exam Constitutional:      Appearance: Normal appearance.  HENT:     Head: Normocephalic and atraumatic.  Eyes:     General: No scleral icterus. Cardiovascular:     Rate and Rhythm: Normal rate.  Pulmonary:     Effort: Pulmonary effort is normal.  Abdominal:     Tenderness: There is no abdominal tenderness.  Skin:    General: Skin is warm and dry.  Neurological:     Mental Status: She is alert and oriented to person, place, and time.  Psychiatric:        Mood and Affect: Mood normal.        Behavior: Behavior normal.     Imaging: CT Abdomen Pelvis W Contrast  Result Date: 08/26/2021 CLINICAL DATA:  Abdominal pain. EXAM: CT ABDOMEN AND PELVIS WITH CONTRAST TECHNIQUE: Multidetector CT imaging of the abdomen and pelvis was performed using the standard protocol following bolus administration of intravenous contrast. CONTRAST:  49mL OMNIPAQUE IOHEXOL 350 MG/ML SOLN COMPARISON:  07/02/2021 FINDINGS: Lower chest: Unremarkable. Hepatobiliary: 13 mm hypoattenuating lesion in the posterior right liver is similar to prior and was characterized as hemangioma on MRI 10/03/2020. No arterial phase hyperenhancement within the liver parenchyma.  Multiple gallstones again noted. No intrahepatic or extrahepatic biliary dilation. Pancreas: No focal mass lesion. No dilatation of the main duct. No intraparenchymal cyst. No peripancreatic edema. Spleen: Marked splenomegaly with extensive venous collateralization in the left abdomen. Adrenals/Urinary Tract: No adrenal nodule or mass. Kidneys unremarkable. No evidence for hydroureter. The urinary bladder appears normal for the degree of distention. Stomach/Bowel: Stomach is unremarkable. No gastric wall thickening. No evidence of outlet obstruction. Duodenum is normally positioned as is the ligament of Treitz. No small bowel wall thickening. No small bowel dilatation. The terminal ileum is normal. The appendix is not well visualized, but there is no edema or inflammation in the region of the cecum. No gross colonic mass. No colonic wall thickening.  Vascular/Lymphatic: No abdominal aortic aneurysm. Chronic portal vein occlusion again noted with extensive venous collateralization in the left abdomen suggesting spleno renal shunt. 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 There is no gastrohepatic or hepatoduodenal ligament lymphadenopathy. No retroperitoneal or mesenteric lymphadenopathy. No pelvic sidewall lymphadenopathy. Reproductive: Mass lesion in the uterine fundus is likely a fibroid. There is no adnexal mass. Other: Small volume free fluid in the cul-de-sac. Stable appearance of mesenteric congestion and trace fluid in the right paracolic gutter. Musculoskeletal: No worrisome lytic or sclerotic osseous abnormality. IMPRESSION: 1. No acute findings in the abdomen or pelvis. Specifically, no findings to explain the patient's history of abdominal pain. 2. 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  Interventional radiology consultation recommended. 3. Chronic portal vein occlusion with extensive venous collateralization in the left abdomen suggesting spleno renal shunt. 4. Cirrhosis. 5. Cholelithiasis. 6. Small volume free fluid in the cul-de-sac. 7. Uterine fibroid. 8. Stable 13 mm hypoattenuating lesion in the posterior right liver was characterized as hemangioma on MRI 10/03/2020. Electronically Signed   By: Misty Stanley M.D.   On: 08/26/2021 15:46   ECHOCARDIOGRAM COMPLETE  Result Date: 09/02/2021    ECHOCARDIOGRAM REPORT   Patient Name:   [place] Date of Exam: 09/02/2021 Medical Rec #:  [id number]           Height:       66.0 in Accession #:    [id number]          Weight:       181.0 lb Date of Birth:  Jun 19, 1951           BSA:          1.917 m Patient Age:    88 years            BP:           102/57 mmHg Patient Gender: F                   HR:           82 bpm. Exam Location:  [facility] Procedure: 2D Echo, 3D Echo, Cardiac Doppler, Color Doppler and Strain Analysis Indications:    I50.31 CHF  History:        Patient has prior history of Echocardiogram examinations, most                 recent 01/23/2021. CHF, Signs/Symptoms:Edema; Risk Factors:Former                 Smoker. Bone Marrow Cancer.  Sonographer:    Deliah Boston RDCS Referring Phys: Randa Evens [id number] IMPRESSIONS  1. Left ventricular ejection fraction, by estimation, is 65 to 70%. The left ventricle has normal function. The left ventricle has no regional wall motion abnormalities. Left ventricular diastolic parameters are consistent with Grade II diastolic dysfunction (pseudonormalization). The average left ventricular global longitudinal strain is 25.7 %. The global longitudinal strain is normal.  2. Right ventricular systolic function is normal. The right ventricular size is normal. There is moderately elevated pulmonary artery systolic pressure. The estimated right ventricular systolic pressure is 83.4 mmHg.  3. The  mitral valve is normal in structure. Trivial mitral valve regurgitation. No evidence of mitral stenosis, the mean gradient is elevated but the valve opens  well -- possible elevation in gradient due to high flow. The mean mitral valve gradient is 6.0 mmHg. Moderate mitral annular calcification.  4. The aortic valve is tricuspid. Aortic valve regurgitation is not visualized. No aortic stenosis is present, the mean gradient is elevated but the valve opens well -- possible elevation in gradient due to high flow (same as mitral valve). Aortic valve  area, by VTI measures 3.23 cm. Aortic valve mean gradient measures 17.0 mmHg.  5. Aortic dilatation noted. There is mild dilatation of the ascending aorta, measuring 42 mm.  6. Left atrial size was severely dilated.  7. The inferior vena cava is dilated in size with <50% respiratory variability, suggesting right atrial pressure of 15 mmHg.  8. No definite ASD was visualized. FINDINGS  Left Ventricle: Left ventricular ejection fraction, by estimation, is 65 to 70%. The left ventricle has normal function. The left ventricle has no regional wall motion abnormalities. The average left ventricular global longitudinal strain is 25.7 %. The  global longitudinal strain is normal. The left ventricular internal cavity size was normal in size. There is no left ventricular hypertrophy. Left ventricular diastolic parameters are consistent with Grade II diastolic dysfunction (pseudonormalization). Right Ventricle: The right ventricular size is normal. No increase in right ventricular wall thickness. Right ventricular systolic function is normal. There is moderately elevated pulmonary artery systolic pressure. The tricuspid regurgitant velocity is 2.92 m/s, and with an assumed right atrial pressure of 15 mmHg, the estimated right ventricular systolic pressure is 16.1 mmHg. Left Atrium: Left atrial size was severely dilated. Right Atrium: Right atrial size was normal in size. Pericardium:  There is no evidence of pericardial effusion. Mitral Valve: The mitral valve is normal in structure. Moderate mitral annular calcification. Trivial mitral valve regurgitation. No evidence of mitral valve stenosis. MV peak gradient, 14.7 mmHg. The mean mitral valve gradient is 6.0 mmHg. Tricuspid Valve: The tricuspid valve is normal in structure. Tricuspid valve regurgitation is trivial. Aortic Valve: The aortic valve is tricuspid. Aortic valve regurgitation is not visualized. No aortic stenosis is present. Aortic valve mean gradient measures 17.0 mmHg. Aortic valve peak gradient measures 27.8 mmHg. Aortic valve area, by VTI measures 3.23 cm. Pulmonic Valve: The pulmonic valve was normal in structure. Pulmonic valve regurgitation is trivial. Aorta: Aortic dilatation noted. There is mild dilatation of the ascending aorta, measuring 42 mm. Venous: The inferior vena cava is dilated in size with less than 50% respiratory variability, suggesting right atrial pressure of 15 mmHg. IAS/Shunts: No definite ASD was visualized.  LEFT VENTRICLE PLAX 2D LVIDd:         5.20 cm   Diastology LVIDs:         3.10 cm   LV e' medial:    11.40 cm/s LV PW:         0.90 cm   LV E/e' medial:  13.3 LV IVS:        1.10 cm   LV e' lateral:   15.70 cm/s LVOT diam:     2.40 cm   LV E/e' lateral: 9.7 LV SV:         177 LV SV Index:   92        2D Longitudinal Strain LVOT Area:     4.52 cm  2D Strain GLS (A2C):   26.2 %                          2D Strain GLS (A3C):  25.9 %                          2D Strain GLS (A4C):   24.9 %                          2D Strain GLS Avg:     25.7 %                           3D Volume EF:                          3D EF:        66 %                          LV EDV:       203 ml                          LV ESV:       69 ml                          LV SV:        134 ml RIGHT VENTRICLE RV S prime:     19.20 cm/s RVOT diam:      2.90 cm TAPSE (M-mode): 3.7 cm LEFT ATRIUM              Index        RIGHT ATRIUM            Index LA diam:        5.00 cm  2.61 cm/m   RA Area:     15.80 cm LA Vol (A2C):   143.0 ml 74.61 ml/m  RA Volume:   42.10 ml  21.96 ml/m LA Vol (A4C):   153.0 ml 79.82 ml/m LA Biplane Vol: 154.0 ml 80.34 ml/m  AORTIC VALVE AV Area (Vmax):    3.33 cm AV Area (Vmean):   3.25 cm AV Area (VTI):     3.23 cm AV Vmax:           263.80 cm/s AV Vmean:          181.800 cm/s AV VTI:            0.547 m AV Peak Grad:      27.8 mmHg AV Mean Grad:      17.0 mmHg LVOT Vmax:         194.00 cm/s LVOT Vmean:        130.500 cm/s LVOT VTI:          0.390 m LVOT/AV VTI ratio: 0.71  AORTA Ao Root diam: 3.50 cm Ao Asc diam:  4.15 cm MITRAL VALVE                TRICUSPID VALVE MV Area (PHT): cm          TR Peak grad:   34.1 mmHg MV Area VTI:   2.48 cm     TR Vmax:        292.00 cm/s MV Peak grad:  14.7 mmHg MV Mean grad:  6.0 mmHg     SHUNTS MV Vmax:       1.92 m/s     Systemic VTI:  0.39 m  MV Vmean:      107.0 cm/s   Systemic Diam: 2.40 cm MV Decel Time: 247 msec     Pulmonic Diam: 2.90 cm MV E velocity: 151.75 cm/s MV A velocity: 134.50 cm/s MV E/A ratio:  1.13 Dalton McleanMD Electronically signed by Franki Monte Signature Date/Time: 09/02/2021/6:22:17 PM    Final     Labs:  CBC: Recent Labs    07/02/21 1538 08/26/21 0916 08/27/21 2301 09/03/21 1614  WBC 4.7 6.6 7.5 6.7  HGB 8.8* 10.1* 9.6* 8.6 Repeated and verified X2.*  HCT 27.9* 31.0* 29.8* 26.0*  PLT 174 264.0 232 230.0    COAGS: Recent Labs    07/02/21 1538 09/03/21 1614  INR 1.8* 1.5*    BMP: Recent Labs    09/13/20 0829 07/02/21 1538 08/26/21 0916 08/27/21 2301 09/03/21 1614  NA 142 142 142 136 140  K 3.6 4.0 4.0 3.7 4.0  CL 112* 111 109 107 107  CO2 25 24 26 24 30   GLUCOSE 110* 84 95 136* 98  BUN 10 13 17 16 13   CALCIUM 8.1* 8.3* 8.7 8.4* 8.4  CREATININE 0.61 0.64 0.58 0.47 0.61  GFRNONAA >60 >60  --  >60  --     LIVER FUNCTION TESTS: Recent Labs    07/02/21 1538 08/26/21 0916 08/27/21 2301 09/03/21 1614  BILITOT  5.3* 4.6* 5.0* 4.7*  AST 31 36 37 21  ALT 19 23 24 15   ALKPHOS 77 90 74 82  PROT 4.9* 5.5* 5.6* 5.2*  ALBUMIN 3.3* 3.8 3.5 3.3*    TUMOR MARKERS: Recent Labs    09/03/21 1614  AFPTM 3.3    Assessment and Plan:  Very pleasant 70 year old female with an incidentally discovered enlarging splenic artery aneurysm measuring up to 3.3 cm.  The aneurysm is saccular and given the clear evidence of growth between February 2020 and October 2022, poses a significant risk for spontaneous rupture which would likely be a life-threatening event.  Her clinical picture is further complicated by known Jak 2 mutation and myelofibrosis with chronic occlusion of the splenic vein and subsequent splenomegaly.  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  She would potentially be at high risk for life-threatening bleeding.  She is a candidate for endovascular embolization of the splenic artery aneurysm.  It is fairly distal and there is definitely some risk of infarction of portions of the spleen.  The degree of infarction is difficult to estimate prospectively.  If greater than 50% of her spleen is infarcted, she would then be at risk for development of splenic abscess which would be a significant complication, particularly in light of her underlying marrow disorder and current kinase inhibitor use.  Ultimately, the risk of sudden spontaneous life-threatening bleeding and death from rupture of her enlarging splenic artery aneurysm is likely greater then the risk of splenic infarction and subsequent infection following treatment.  She understands and desires to proceed with endovascular treatment.  1.)  Please schedule for catheter embolization of splenic artery aneurysm to be performed by me at [facility] as soon as possible.  We will pursue general anesthesia for this case and patient will likely be admitted for 1 to 2 days  following the procedure.  I will ask my staff to prioritize scheduling this procedure given the relatively high risk of spontaneous rupture.  2.) Given her significant family history of popliteal and intracranial aneurysms, I would also like to screen  for these.  Please order MRA head for possible intracranial aneurysms and bilateral lower extremity duplex US to assess for popliteal aneurysm.   Thank you for this interesting consult.  I greatly enjoyed meeting Courtney Buckley and look forward to participating in their care.  A copy of this report was sent to the requesting provider on this date.  Electronically Signed: Criselda Peaches 09/09/2021, 11:55 AM   I spent a total of  40 Minutes  in face to face in clinical consultation, greater than 50% of which was counseling/coordinating care for splenic artery aneurysm.

## 2021-09-09 NOTE — Telephone Encounter (Signed)
CoverMyMeds: Prior Auth for Xifaxan 550 mg bid approved.  PJR:[id number] "PA Case: [id number], Status: Approved, Coverage Starts on: 11/02/2020 12:00:00 AM, Coverage Ends on: 11/01/2022"

## 2021-09-10 ENCOUNTER — Other Ambulatory Visit: Payer: Self-pay | Admitting: Interventional Radiology

## 2021-09-10 DIAGNOSIS — I724 Aneurysm of artery of lower extremity: Secondary | ICD-10-CM

## 2021-09-10 DIAGNOSIS — Z8249 Family history of ischemic heart disease and other diseases of the circulatory system: Secondary | ICD-10-CM

## 2021-09-10 NOTE — Telephone Encounter (Signed)
Patient and pharmacy notified of the approval. The patient is asked to let me know if the co-pay is very high. If it is, I will see if there is any assistance available for her.

## 2021-09-11 ENCOUNTER — Telehealth: Payer: Self-pay

## 2021-09-11 ENCOUNTER — Telehealth (HOSPITAL_COMMUNITY): Payer: Self-pay

## 2021-09-11 DIAGNOSIS — G43909 Migraine, unspecified, not intractable, without status migrainosus: Secondary | ICD-10-CM | POA: Diagnosis not present

## 2021-09-11 DIAGNOSIS — D7581 Myelofibrosis: Secondary | ICD-10-CM | POA: Diagnosis not present

## 2021-09-11 DIAGNOSIS — I81 Portal vein thrombosis: Secondary | ICD-10-CM | POA: Diagnosis not present

## 2021-09-11 DIAGNOSIS — R161 Splenomegaly, not elsewhere classified: Secondary | ICD-10-CM | POA: Diagnosis not present

## 2021-09-11 DIAGNOSIS — K7460 Unspecified cirrhosis of liver: Secondary | ICD-10-CM | POA: Diagnosis not present

## 2021-09-11 DIAGNOSIS — R109 Unspecified abdominal pain: Secondary | ICD-10-CM | POA: Diagnosis not present

## 2021-09-11 DIAGNOSIS — Z79899 Other long term (current) drug therapy: Secondary | ICD-10-CM | POA: Diagnosis not present

## 2021-09-11 DIAGNOSIS — F32A Depression, unspecified: Secondary | ICD-10-CM | POA: Diagnosis not present

## 2021-09-11 DIAGNOSIS — R609 Edema, unspecified: Secondary | ICD-10-CM | POA: Diagnosis not present

## 2021-09-11 NOTE — Telephone Encounter (Signed)
Spoke with the patient. She understands the plan. She will not go for MRI on 09/15/21. No other changes to the appointments she has scheduled. Contact Radiology Scheduling for the cancellations.

## 2021-09-11 NOTE — Telephone Encounter (Signed)
-----   Message from Jerene Bears, MD sent at 09/11/2021  1:14 PM EST ----- Regarding: RE: Courtney Buckley I agree with holding off on the MRI until after her splenic artery embolization Okay to cancel this MRI for now Thanks JMP  ----- Message ----- From: Alfredia Ferguson, PA-C Sent: 09/10/2021   1:31 PM EST To: Jerene Bears, MD, Greggory Keen, LPN Subject: Courtney Buckley                            Dr. Barton Dubois discussed this patient late last week, your patient who has been having a lot of mid/periumbilical pain over the past 6 weeks, generally feeling poorly in setting of her known cryptogenic cirrhosis, splenic vein thrombosis, myelodysplastic syndrome  She has been seen by IR and Dr. Laurence Ferrari plans for IR angiography and embolization next week of the splenic artery aneurysm.  Also getting MRI of her head because of family history of aneurysms etc.  I had ordered an MRI of the abdomen and pelvis, her insurance is not wanting to cover at this point I think it may be reasonable to cancel the MRI for now, wait until she has the angiography and embolization next week, and see how her pain responds, then decide about proceeding with MRI, going through peer-to-peer etc. If you are okay with that plan we will notify the patient, if not we will try to proceed with appealing their decision and have a peer to peer conversation.

## 2021-09-11 NOTE — Telephone Encounter (Signed)
Called to schedule splenic artery embo, no answer, left vm. AW

## 2021-09-13 ENCOUNTER — Ambulatory Visit (HOSPITAL_COMMUNITY)
Admission: RE | Admit: 2021-09-13 | Discharge: 2021-09-13 | Disposition: A | Discharge status: Home / Self Care | Payer: Medicare PPO | Source: Ambulatory Visit | Attending: Interventional Radiology | Admitting: Interventional Radiology

## 2021-09-13 DIAGNOSIS — I728 Aneurysm of other specified arteries: Secondary | ICD-10-CM | POA: Insufficient documentation

## 2021-09-13 DIAGNOSIS — Z8249 Family history of ischemic heart disease and other diseases of the circulatory system: Secondary | ICD-10-CM | POA: Diagnosis not present

## 2021-09-13 DIAGNOSIS — I771 Stricture of artery: Secondary | ICD-10-CM | POA: Diagnosis not present

## 2021-09-13 DIAGNOSIS — I671 Cerebral aneurysm, nonruptured: Secondary | ICD-10-CM | POA: Diagnosis not present

## 2021-09-13 MED ORDER — GADOBUTROL 1 MMOL/ML IV SOLN
7.5000 mL | Freq: Once | INTRAVENOUS | Status: AC | PRN
  Administered 2021-09-13: 7.5 mL via INTRAVENOUS

## 2021-09-15 ENCOUNTER — Ambulatory Visit (HOSPITAL_COMMUNITY): Payer: Medicare PPO

## 2021-09-16 ENCOUNTER — Ambulatory Visit
Admission: RE | Admit: 2021-09-16 | Discharge: 2021-09-16 | Disposition: A | Discharge status: Home / Self Care | Payer: Medicare PPO | Source: Ambulatory Visit | Attending: Interventional Radiology | Admitting: Interventional Radiology

## 2021-09-16 ENCOUNTER — Other Ambulatory Visit (HOSPITAL_COMMUNITY): Payer: Self-pay | Admitting: Interventional Radiology

## 2021-09-16 DIAGNOSIS — Z8249 Family history of ischemic heart disease and other diseases of the circulatory system: Secondary | ICD-10-CM | POA: Diagnosis not present

## 2021-09-16 DIAGNOSIS — I70203 Unspecified atherosclerosis of native arteries of extremities, bilateral legs: Secondary | ICD-10-CM | POA: Diagnosis not present

## 2021-09-16 DIAGNOSIS — I728 Aneurysm of other specified arteries: Secondary | ICD-10-CM | POA: Diagnosis not present

## 2021-09-16 DIAGNOSIS — Z8679 Personal history of other diseases of the circulatory system: Secondary | ICD-10-CM | POA: Diagnosis not present

## 2021-09-16 DIAGNOSIS — I724 Aneurysm of artery of lower extremity: Secondary | ICD-10-CM

## 2021-09-16 LAB — SARS CORONAVIRUS 2 (TAT 6-24 HRS): SARS Coronavirus 2: NEGATIVE

## 2021-09-17 ENCOUNTER — Other Ambulatory Visit: Payer: Self-pay | Admitting: Radiology

## 2021-09-18 ENCOUNTER — Inpatient Hospital Stay (HOSPITAL_COMMUNITY)
Admission: RE | Admit: 2021-09-18 | Discharge: 2021-09-18 | Disposition: A | Discharge status: Home / Self Care | Payer: Medicare PPO | Source: Ambulatory Visit | Attending: Interventional Radiology | Admitting: Interventional Radiology

## 2021-09-18 ENCOUNTER — Ambulatory Visit (HOSPITAL_COMMUNITY): Payer: Medicare PPO | Admitting: Anesthesiology

## 2021-09-18 ENCOUNTER — Encounter (HOSPITAL_COMMUNITY)
Admission: AD | Disposition: A | Discharge status: Home / Self Care | Payer: Self-pay | Source: Home / Self Care | Attending: Interventional Radiology

## 2021-09-18 ENCOUNTER — Inpatient Hospital Stay (HOSPITAL_COMMUNITY)
Admission: AD | Admit: 2021-09-18 | Discharge: 2021-09-20 | DRG: 270 | Disposition: A | Discharge status: Home / Self Care | Payer: Medicare PPO | Attending: Interventional Radiology | Admitting: Interventional Radiology

## 2021-09-18 ENCOUNTER — Encounter (HOSPITAL_COMMUNITY): Payer: Self-pay

## 2021-09-18 DIAGNOSIS — Z87891 Personal history of nicotine dependence: Secondary | ICD-10-CM | POA: Diagnosis not present

## 2021-09-18 DIAGNOSIS — K648 Other hemorrhoids: Secondary | ICD-10-CM | POA: Diagnosis not present

## 2021-09-18 DIAGNOSIS — Z8719 Personal history of other diseases of the digestive system: Secondary | ICD-10-CM | POA: Diagnosis not present

## 2021-09-18 DIAGNOSIS — Z800 Family history of malignant neoplasm of digestive organs: Secondary | ICD-10-CM | POA: Diagnosis not present

## 2021-09-18 DIAGNOSIS — Z79899 Other long term (current) drug therapy: Secondary | ICD-10-CM

## 2021-09-18 DIAGNOSIS — R6881 Early satiety: Secondary | ICD-10-CM | POA: Diagnosis not present

## 2021-09-18 DIAGNOSIS — D7581 Myelofibrosis: Secondary | ICD-10-CM | POA: Diagnosis present

## 2021-09-18 DIAGNOSIS — I728 Aneurysm of other specified arteries: Principal | ICD-10-CM | POA: Diagnosis present

## 2021-09-18 DIAGNOSIS — R161 Splenomegaly, not elsewhere classified: Secondary | ICD-10-CM | POA: Diagnosis present

## 2021-09-18 DIAGNOSIS — K7460 Unspecified cirrhosis of liver: Secondary | ICD-10-CM | POA: Diagnosis not present

## 2021-09-18 DIAGNOSIS — Z833 Family history of diabetes mellitus: Secondary | ICD-10-CM

## 2021-09-18 DIAGNOSIS — I81 Portal vein thrombosis: Secondary | ICD-10-CM | POA: Diagnosis present

## 2021-09-18 DIAGNOSIS — I8510 Secondary esophageal varices without bleeding: Secondary | ICD-10-CM | POA: Diagnosis not present

## 2021-09-18 DIAGNOSIS — K766 Portal hypertension: Secondary | ICD-10-CM | POA: Diagnosis not present

## 2021-09-18 HISTORY — DX: Malignant (primary) neoplasm, unspecified: C80.1

## 2021-09-18 HISTORY — DX: Aneurysm of other specified arteries: I72.8

## 2021-09-18 HISTORY — PX: RADIOLOGY WITH ANESTHESIA: SHX6223

## 2021-09-18 HISTORY — PX: IR US GUIDE VASC ACCESS RIGHT: IMG2390

## 2021-09-18 HISTORY — PX: IR ANGIOGRAM SELECTIVE EACH ADDITIONAL VESSEL: IMG667

## 2021-09-18 HISTORY — PX: IR EMBO ARTERIAL NOT HEMORR HEMANG INC GUIDE ROADMAPPING: IMG5448

## 2021-09-18 HISTORY — PX: IR ANGIOGRAM VISCERAL SELECTIVE: IMG657

## 2021-09-18 LAB — CBC
HCT: 34.7 % — ABNORMAL LOW (ref 36.0–46.0)
Hemoglobin: 10.3 g/dL — ABNORMAL LOW (ref 12.0–15.0)
MCH: 28.2 pg (ref 26.0–34.0)
MCHC: 29.7 g/dL — ABNORMAL LOW (ref 30.0–36.0)
MCV: 95.1 fL (ref 80.0–100.0)
Platelets: 258 10*3/uL (ref 150–400)
RBC: 3.65 MIL/uL — ABNORMAL LOW (ref 3.87–5.11)
RDW: 22.7 % — ABNORMAL HIGH (ref 11.5–15.5)
WBC: 7.2 10*3/uL (ref 4.0–10.5)
nRBC: 0.3 % — ABNORMAL HIGH (ref 0.0–0.2)

## 2021-09-18 LAB — BASIC METABOLIC PANEL
Anion gap: 7 (ref 5–15)
BUN: 12 mg/dL (ref 8–23)
CO2: 23 mmol/L (ref 22–32)
Calcium: 8.4 mg/dL — ABNORMAL LOW (ref 8.9–10.3)
Chloride: 113 mmol/L — ABNORMAL HIGH (ref 98–111)
Creatinine, Ser: 0.61 mg/dL (ref 0.44–1.00)
GFR, Estimated: >60 mL/min (ref >60)
Glucose, Bld: 92 mg/dL (ref 70–99)
Potassium: 4.1 mmol/L (ref 3.5–5.1)
Sodium: 143 mmol/L (ref 135–145)

## 2021-09-18 LAB — PROTIME-INR
INR: 1.7 — ABNORMAL HIGH (ref 0.8–1.2)
Prothrombin Time: 20.3 seconds — ABNORMAL HIGH (ref 11.4–15.2)

## 2021-09-18 SURGERY — IR WITH ANESTHESIA
Anesthesia: General

## 2021-09-18 MED ORDER — ROCURONIUM BROMIDE 10 MG/ML (PF) SYRINGE
PREFILLED_SYRINGE | INTRAVENOUS | Status: DC | PRN
  Administered 2021-09-18 (×2): 10 mg via INTRAVENOUS
  Administered 2021-09-18: 70 mg via INTRAVENOUS

## 2021-09-18 MED ORDER — LIDOCAINE 2% (20 MG/ML) 5 ML SYRINGE
INTRAMUSCULAR | Status: DC | PRN
  Administered 2021-09-18: 60 mg via INTRAVENOUS

## 2021-09-18 MED ORDER — LACTULOSE 10 GM/15ML PO SOLN
20.0000 g | Freq: Two times a day (BID) | ORAL | Status: DC | PRN
  Administered 2021-09-19 – 2021-09-20 (×2): 20 g via ORAL
  Filled 2021-09-18 (×2): qty 30

## 2021-09-18 MED ORDER — PHENYLEPHRINE HCL-NACL 20-0.9 MG/250ML-% IV SOLN
INTRAVENOUS | Status: DC | PRN
  Administered 2021-09-18: 40 ug/min via INTRAVENOUS

## 2021-09-18 MED ORDER — NAPROXEN SODIUM 275 MG PO TABS: 550.0000 mg | ORAL_TABLET | Freq: Two times a day (BID) | ORAL | Status: DC

## 2021-09-18 MED ORDER — KETOROLAC TROMETHAMINE 30 MG/ML IJ SOLN
15.0000 mg | Freq: Four times a day (QID) | INTRAMUSCULAR | Status: DC
  Administered 2021-09-19 – 2021-09-20 (×6): 15 mg via INTRAVENOUS
  Filled 2021-09-18 (×7): qty 1

## 2021-09-18 MED ORDER — PROPOFOL 10 MG/ML IV BOLUS
INTRAVENOUS | Status: DC | PRN
  Administered 2021-09-18: 110 mg via INTRAVENOUS

## 2021-09-18 MED ORDER — SODIUM CHLORIDE 0.9 % IV SOLN: INTRAVENOUS | Status: AC

## 2021-09-18 MED ORDER — IOHEXOL 300 MG/ML  SOLN
100.0000 mL | Freq: Once | INTRAMUSCULAR | Status: AC | PRN
  Administered 2021-09-18: 16:00:00 50 mL via INTRA_ARTERIAL

## 2021-09-18 MED ORDER — LEVOFLOXACIN 750 MG PO TABS
750.0000 mg | ORAL_TABLET | Freq: Every day | ORAL | Status: DC
  Administered 2021-09-19 – 2021-09-20 (×2): 750 mg via ORAL
  Filled 2021-09-18 (×2): qty 1

## 2021-09-18 MED ORDER — ONDANSETRON HCL 4 MG/2ML IJ SOLN
4.0000 mg | Freq: Once | INTRAMUSCULAR | Status: AC
  Administered 2021-09-18: 17:00:00 4 mg via INTRAVENOUS

## 2021-09-18 MED ORDER — KETOROLAC TROMETHAMINE 60 MG/2ML IM SOLN
INTRAMUSCULAR | Status: DC | PRN
  Administered 2021-09-18: 30 mg via INTRAMUSCULAR

## 2021-09-18 MED ORDER — KETOROLAC TROMETHAMINE 60 MG/2ML IM SOLN
30.0000 mg | Freq: Once | INTRAMUSCULAR | Status: AC
  Filled 2021-09-18: qty 2

## 2021-09-18 MED ORDER — LACTATED RINGERS IV SOLN: INTRAVENOUS | Status: DC | PRN

## 2021-09-18 MED ORDER — RIFAXIMIN 550 MG PO TABS
550.0000 mg | ORAL_TABLET | Freq: Two times a day (BID) | ORAL | Status: DC
  Administered 2021-09-19 – 2021-09-20 (×3): 550 mg via ORAL
  Filled 2021-09-18 (×4): qty 1

## 2021-09-18 MED ORDER — FENTANYL CITRATE (PF) 100 MCG/2ML IJ SOLN
INTRAMUSCULAR | Status: DC | PRN
  Administered 2021-09-18: 100 ug via INTRAVENOUS

## 2021-09-18 MED ORDER — PREDNISONE 20 MG PO TABS
20.0000 mg | ORAL_TABLET | Freq: Every day | ORAL | Status: AC
  Administered 2021-09-19: 20 mg via ORAL
  Filled 2021-09-18: qty 1

## 2021-09-18 MED ORDER — SODIUM CHLORIDE 0.9% FLUSH
3.0000 mL | Freq: Two times a day (BID) | INTRAVENOUS | Status: DC
  Administered 2021-09-18 – 2021-09-20 (×4): 3 mL via INTRAVENOUS

## 2021-09-18 MED ORDER — KETOROLAC TROMETHAMINE 30 MG/ML IJ SOLN
INTRAMUSCULAR | Status: AC
  Filled 2021-09-18: qty 1

## 2021-09-18 MED ORDER — SUGAMMADEX SODIUM 200 MG/2ML IV SOLN
INTRAVENOUS | Status: DC | PRN
  Administered 2021-09-18: 200 mg via INTRAVENOUS

## 2021-09-18 MED ORDER — RUXOLITINIB PHOSPHATE 10 MG PO TABS
10.0000 mg | ORAL_TABLET | Freq: Two times a day (BID) | ORAL | Status: DC
  Administered 2021-09-18 – 2021-09-20 (×4): 10 mg via ORAL
  Filled 2021-09-18 (×3): qty 1

## 2021-09-18 MED ORDER — THROMBIN FOR PERCUTANEOUS TREATMENT OF PSEUDOANEURYSM (5000UNITS/10ML)
Freq: Once | PERCUTANEOUS | Status: AC
  Filled 2021-09-18: qty 1

## 2021-09-18 MED ORDER — PROMETHAZINE HCL 25 MG/ML IJ SOLN
INTRAMUSCULAR | Status: AC
  Filled 2021-09-18: qty 1

## 2021-09-18 MED ORDER — CHLORHEXIDINE GLUCONATE 0.12 % MT SOLN
OROMUCOSAL | Status: AC
  Administered 2021-09-18: 11:00:00 15 mL via OROMUCOSAL
  Filled 2021-09-18: qty 15

## 2021-09-18 MED ORDER — CHLORHEXIDINE GLUCONATE 0.12 % MT SOLN: 15.0000 mL | Freq: Once | OROMUCOSAL | Status: AC

## 2021-09-18 MED ORDER — FENTANYL CITRATE (PF) 100 MCG/2ML IJ SOLN
25.0000 ug | INTRAMUSCULAR | Status: DC | PRN
  Administered 2021-09-18 (×3): 50 ug via INTRAVENOUS

## 2021-09-18 MED ORDER — PROMETHAZINE HCL 25 MG PO TABS: 12.5000 mg | ORAL_TABLET | Freq: Four times a day (QID) | ORAL | Status: DC | PRN

## 2021-09-18 MED ORDER — PROMETHAZINE HCL 25 MG/ML IJ SOLN
6.2500 mg | Freq: Once | INTRAMUSCULAR | Status: AC
  Administered 2021-09-18: 17:00:00 6.25 mg via INTRAVENOUS

## 2021-09-18 MED ORDER — PROMETHAZINE HCL 25 MG PO TABS: 25.0000 mg | ORAL_TABLET | Freq: Three times a day (TID) | ORAL | Status: DC | PRN

## 2021-09-18 MED ORDER — PROMETHAZINE HCL 25 MG RE SUPP
25.0000 mg | Freq: Three times a day (TID) | RECTAL | Status: DC | PRN
  Filled 2021-09-18: qty 1

## 2021-09-18 MED ORDER — IOHEXOL 300 MG/ML  SOLN
100.0000 mL | Freq: Once | INTRAMUSCULAR | Status: AC | PRN
  Administered 2021-09-18: 16:00:00 35 mL via INTRA_ARTERIAL

## 2021-09-18 MED ORDER — DEXAMETHASONE SODIUM PHOSPHATE 10 MG/ML IJ SOLN
INTRAMUSCULAR | Status: DC | PRN
  Administered 2021-09-18: 5 mg via INTRAVENOUS
  Administered 2021-09-18: 10 mg via INTRAVENOUS

## 2021-09-18 MED ORDER — LACTATED RINGERS IV SOLN: INTRAVENOUS | Status: DC

## 2021-09-18 MED ORDER — FENTANYL CITRATE (PF) 100 MCG/2ML IJ SOLN
INTRAMUSCULAR | Status: AC
  Filled 2021-09-18: qty 2

## 2021-09-18 MED ORDER — LEVOFLOXACIN IN D5W 500 MG/100ML IV SOLN
500.0000 mg | Freq: Once | INTRAVENOUS | Status: AC
  Administered 2021-09-18: 14:00:00 500 mg via INTRAVENOUS
  Filled 2021-09-18: qty 100

## 2021-09-18 MED ORDER — ALBUMIN HUMAN 5 % IV SOLN: INTRAVENOUS | Status: DC | PRN

## 2021-09-18 MED ORDER — ONDANSETRON HCL 4 MG/2ML IJ SOLN
INTRAMUSCULAR | Status: DC | PRN
  Administered 2021-09-18: 4 mg via INTRAVENOUS

## 2021-09-18 MED ORDER — ONDANSETRON HCL 4 MG/2ML IJ SOLN
4.0000 mg | Freq: Four times a day (QID) | INTRAMUSCULAR | Status: DC | PRN
  Administered 2021-09-18: 19:00:00 4 mg via INTRAVENOUS
  Filled 2021-09-18: qty 2

## 2021-09-18 MED ORDER — SODIUM CHLORIDE 0.9% FLUSH: 3.0000 mL | INTRAVENOUS | Status: DC | PRN

## 2021-09-18 MED ORDER — KETOROLAC TROMETHAMINE 30 MG/ML IJ SOLN
30.0000 mg | Freq: Four times a day (QID) | INTRAMUSCULAR | Status: DC
  Administered 2021-09-18: 19:00:00 30 mg via INTRAVENOUS
  Filled 2021-09-18: qty 1

## 2021-09-18 MED ORDER — HYDROCODONE-ACETAMINOPHEN 10-325 MG PO TABS
1.0000 | ORAL_TABLET | Freq: Four times a day (QID) | ORAL | Status: DC | PRN
  Administered 2021-09-18 – 2021-09-20 (×4): 1 via ORAL
  Filled 2021-09-18 (×4): qty 1

## 2021-09-18 MED ORDER — ONDANSETRON HCL 4 MG/2ML IJ SOLN
INTRAMUSCULAR | Status: AC
  Filled 2021-09-18: qty 2

## 2021-09-18 MED ORDER — SODIUM CHLORIDE 0.9 % IV SOLN: INTRAVENOUS | Status: DC

## 2021-09-18 MED ORDER — SODIUM CHLORIDE 0.9 % IV SOLN: 250.0000 mL | INTRAVENOUS | Status: DC | PRN

## 2021-09-18 MED ORDER — HYDROCODONE-ACETAMINOPHEN 5-325 MG PO TABS: 1.0000 | ORAL_TABLET | ORAL | Status: DC | PRN

## 2021-09-18 MED ORDER — ORAL CARE MOUTH RINSE: 15.0000 mL | Freq: Once | OROMUCOSAL | Status: AC

## 2021-09-18 MED ORDER — DOCUSATE SODIUM 100 MG PO CAPS
100.0000 mg | ORAL_CAPSULE | Freq: Two times a day (BID) | ORAL | Status: DC
  Administered 2021-09-19 – 2021-09-20 (×3): 100 mg via ORAL
  Filled 2021-09-18 (×3): qty 1

## 2021-09-18 NOTE — Procedures (Signed)
Interventional Radiology Procedure Note  Procedure: Splenic aneurysm embolization  Complications: None  Estimated Blood Loss: None  Recommendations: - Admit for obs - Hydrate - Abx - Prepare for and manage post embolization syndrome (steroid taper, NSAIDs, pain control) - ADAT   Signed,  Criselda Peaches, MD

## 2021-09-18 NOTE — Anesthesia Procedure Notes (Signed)
Arterial Line Insertion Start/End11/17/2022 1:00 PM, 09/18/2021 1:02 PM Performed by: CRNA  Patient location: O[facility] procedure area. Preanesthetic checklist: patient identified, IV checked, site marked, risks and benefits discussed, surgical consent, monitors and equipment checked, pre-op evaluation, timeout performed and anesthesia consent Lidocaine 1% used for infiltration and patient sedated Left, radial was placed Catheter size: 20 G Hand hygiene performed  and maximum sterile barriers used   Attempts: 1 Procedure performed without using ultrasound guided technique. Following insertion, Biopatch and dressing applied. Post procedure assessment: normal

## 2021-09-18 NOTE — H&P (Signed)
Chief Complaint: Patient was seen in consultation today for Splenic arteriogram with possible splenic artery aneurysm embolization at the request of Dr Chryl Heck   Supervising Physician: Jacqulynn Cadet  Patient Status: [facility] - Out-pt  History of Present Illness: Courtney Buckley is a 70 y.o. female   Chronic portal vein thrombosis with cavernous transformation and marked splenorenal portosystemic collateralization in addition to JAK 2 mutation myelofibrosis and splenomegaly.  She is under the care of oncology (Dr. Chryl Heck).  Recent abdominal imaging for abdominal pain demonstrated an enlarging splenic artery aneurysm.  CT imaging from October 2022 demonstrates the aneurysm to measure 3.3 x 2.9 cm which is definitively larger compared to 2.8 x 1.9 cm along similar dimensions from a prior MRI obtained in February 2020.     She has a history of early satiety and nagging left upper quadrant pain which has been attributed to her splenomegaly.  She is currently on Jakafi.  Interestingly, she reports a significant family history of aneurysm disease.  Her mother had intracranial arterial aneurysms requiring clipping and embolization and her sister had a popliteal artery aneurysm.   Consult with Dr Laurence Ferrari 09/09/21 Very pleasant 70 year old female with an incidentally discovered enlarging splenic artery aneurysm measuring up to 3.3 cm.  The aneurysm is saccular and given the clear evidence of growth between February 2020 and October 2022, poses a significant risk for spontaneous rupture which would likely be a life-threatening event.       Her clinical picture is further complicated by known Jak 2 mutation and myelofibrosis with chronic occlusion of the splenic vein and subsequent splenomegaly.       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  She would potentially be at high risk for life-threatening bleeding.  She is a  candidate for endovascular embolization of the splenic artery aneurysm.  It is fairly distal and there is definitely some risk of infarction of portions of the spleen.  The degree of infarction is difficult to estimate prospectively.  If greater than 50% of her spleen is infarcted, she would then be at risk for development of splenic abscess which would be a significant complication, particularly in light of her underlying marrow disorder and current kinase inhibitor use.  Ultimately, the risk of sudden spontaneous life-threatening bleeding and death from rupture of her enlarging splenic artery aneurysm is likely greater then the risk of splenic infarction and subsequent infection following treatment.      She understands and desires to proceed with endovascular treatment. Given her significant family history of popliteal and intracranial aneurysms, I would also like to screen for these.  Please order MRA head for possible intracranial aneurysms and bilateral lower extremity duplex US to assess for popliteal aneurysm.   All studies neg for aneurysms  Scheduled now today for splenic artery arteriogram with possible splenic artery aneurysm embolization Plan for admission post procedure   Past Medical History:  Diagnosis Date   Cirrhosis ([place])    Colon polyps    Depression    Esophageal varices ([facility])    Gastritis    Internal hemorrhoids    Migraine    Portal hypertensive gastropathy ([facility])    PVT (portal vein thrombosis)     Past Surgical History:  Procedure Laterality Date   IR RADIOLOGIST EVAL & MGMT  09/09/2021   LIVER BIOPSY      Allergies: Patient has no known allergies.  Medications: Prior to Admission medications  Medication Sig Start Date End Date Taking? Authorizing Provider  furosemide (LASIX) 20 MG tablet Take 1 tablet (20 mg total) by mouth 2 (two) times daily. Patient taking differently: Take 20 mg by mouth as needed for fluid or edema. 07/14/21 09/18/21 Yes Allwardt,  Randa Evens, PA-C  HYDROcodone-acetaminophen (NORCO) 10-325 MG tablet Take 1 tablet by mouth every 6 (six) hours as needed for moderate pain or severe pain.   Yes [provider]  JAKAFI 10 MG tablet Take 10 mg by mouth 2 (two) times daily. 06/11/21  Yes [provider]  lactulose (CHRONULAC) 10 GM/15ML solution Take 45 ml daily 09/05/21  Yes Esterwood, Amy S, PA-C  promethazine (PHENERGAN) 12.5 MG tablet Take 12.5 mg by mouth every 6 (six) hours as needed for nausea or vomiting.   Yes [provider]  rifaximin (XIFAXAN) 550 MG TABS tablet Take 1 tablet (550 mg total) by mouth 2 (two) times daily. Patient not taking: Reported on 09/15/2021 09/05/21   Alfredia Ferguson, PA-C     Family History  Problem Relation Age of Onset   Cancer - Lung Father    Diabetes Mellitus II Maternal Aunt    Stomach cancer Maternal Aunt    Diabetes Mellitus II Maternal Uncle    Colon cancer Neg Hx    Rectal cancer Neg Hx    Esophageal cancer Neg Hx    Liver disease Neg Hx    Pancreatic cancer Neg Hx     Social History   Socioeconomic History   Marital status: Significant Other    Spouse name: Not on file   Number of children: 2   Years of education: Not on file   Highest education level: Not on file  Occupational History   Not on file  Tobacco Use   Smoking status: Former    Types: Cigarettes   Smokeless tobacco: Never  Vaping Use   Vaping Use: Never used  Substance and Sexual Activity   Alcohol use: Not Currently    Comment: Occasionally-1 acoholic beverage every 1-2 weeks   Drug use: No   Sexual activity: Not on file  Other Topics Concern   Not on file  Social History Narrative   Not on file   Social Determinants of Health   Financial Resource Strain: Low Risk    Difficulty of Paying Living Expenses: Not hard at all  Food Insecurity: No Food Insecurity   Worried About Charity fundraiser in the Last Year: Never true   Ran Out of Food in the Last Year: Never true   Transportation Needs: No Transportation Needs   Lack of Transportation (Medical): No   Lack of Transportation (Non-Medical): No  Physical Activity: Insufficiently Active   Days of Exercise per Week: 3 days   Minutes of Exercise per Session: 20 min  Stress: No Stress Concern Present   Feeling of Stress : Only a little  Social Connections: Moderately Isolated   Frequency of Communication with Friends and Family: More than three times a week   Frequency of Social Gatherings with Friends and Family: More than three times a week   Attends Religious Services: Never   Marine scientist or Organizations: No   Attends Archivist Meetings: Never   Marital Status: Married    Review of Systems: A 12 point ROS discussed and pertinent positives are indicated in the HPI above.  All other systems are negative.  Review of Systems  Constitutional:  Negative for activity change, fatigue  and fever.  Respiratory:  Negative for cough and shortness of breath.   Cardiovascular:  Negative for chest pain.  Gastrointestinal:  Positive for abdominal pain.  Musculoskeletal:  Negative for back pain and gait problem.  Neurological:  Negative for weakness.  Psychiatric/Behavioral:  Negative for behavioral problems and confusion.    Vital Signs: BP 113/75   Pulse 75   Temp 98.1 F (36.7 C) (Oral)   Resp 17   Ht 5\' 6"  (1.676 m)   Wt 180 lb (81.6 kg)   SpO2 100%   BMI 29.05 kg/m   Physical Exam Vitals reviewed.  HENT:     Mouth/Throat:     Mouth: Mucous membranes are moist.  Cardiovascular:     Rate and Rhythm: Normal rate and regular rhythm.     Heart sounds: Normal heart sounds.  Pulmonary:     Effort: Pulmonary effort is normal.     Breath sounds: Normal breath sounds.  Abdominal:     Palpations: Abdomen is soft.     Tenderness: There is no abdominal tenderness.  Musculoskeletal:        General: Normal range of motion.  Skin:    General: Skin is warm.  Neurological:      Mental Status: She is alert and oriented to person, place, and time.  Psychiatric:        Behavior: Behavior normal.    Imaging: CT Abdomen Pelvis W Contrast  Result Date: 08/26/2021 CLINICAL DATA:  Abdominal pain. EXAM: CT ABDOMEN AND PELVIS WITH CONTRAST TECHNIQUE: Multidetector CT imaging of the abdomen and pelvis was performed using the standard protocol following bolus administration of intravenous contrast. CONTRAST:  68mL OMNIPAQUE IOHEXOL 350 MG/ML SOLN COMPARISON:  07/02/2021 FINDINGS: Lower chest: Unremarkable. Hepatobiliary: 13 mm hypoattenuating lesion in the posterior right liver is similar to prior and was characterized as hemangioma on MRI 10/03/2020. No arterial phase hyperenhancement within the liver parenchyma. Multiple gallstones again noted. No intrahepatic or extrahepatic biliary dilation. Pancreas: No focal mass lesion. No dilatation of the main duct. No intraparenchymal cyst. No peripancreatic edema. Spleen: Marked splenomegaly with extensive venous collateralization in the left abdomen. Adrenals/Urinary Tract: No adrenal nodule or mass. Kidneys unremarkable. No evidence for hydroureter. The urinary bladder appears normal for the degree of distention. Stomach/Bowel: Stomach is unremarkable. No gastric wall thickening. No evidence of outlet obstruction. Duodenum is normally positioned as is the ligament of Treitz. No small bowel wall thickening. No small bowel dilatation. The terminal ileum is normal. The appendix is not well visualized, but there is no edema or inflammation in the region of the cecum. No gross colonic mass. No colonic wall thickening. Vascular/Lymphatic: No abdominal aortic aneurysm. Chronic portal vein occlusion again noted with extensive venous collateralization in the left abdomen suggesting spleno renal shunt. Of particular note, arterial phase imaging shows the presence of a 2.9 x 3.3 x 3.0 cm saccular aneurysm of the distal splenic artery without appreciable  thrombus. There is no gastrohepatic or hepatoduodenal ligament lymphadenopathy. No retroperitoneal or mesenteric lymphadenopathy. No pelvic sidewall lymphadenopathy. Reproductive: Mass lesion in the uterine fundus is likely a fibroid. There is no adnexal mass. Other: Small volume free fluid in the cul-de-sac. Stable appearance of mesenteric congestion and trace fluid in the right paracolic gutter. Musculoskeletal: No worrisome lytic or sclerotic osseous abnormality. IMPRESSION: 1. No acute findings in the abdomen or pelvis. Specifically, no findings to explain the patient's history of abdominal pain. 2. 2.9 x 3.3 x 3.0 cm saccular aneurysm of the distal splenic  artery without appreciable thrombus and not substantially changed since prior. Interventional radiology consultation recommended. 3. Chronic portal vein occlusion with extensive venous collateralization in the left abdomen suggesting spleno renal shunt. 4. Cirrhosis. 5. Cholelithiasis. 6. Small volume free fluid in the cul-de-sac. 7. Uterine fibroid. 8. Stable 13 mm hypoattenuating lesion in the posterior right liver was characterized as hemangioma on MRI 10/03/2020. Electronically Signed   By: Misty Stanley M.D.   On: 08/26/2021 15:46   US ARTERIAL ABI (SCREENING LOWER EXTREMITY)  Result Date: 09/16/2021 CLINICAL DATA:  Family history of popliteal artery aneurysms EXAM: NONINVASIVE PHYSIOLOGIC VASCULAR STUDY OF BILATERAL LOWER EXTREMITIES TECHNIQUE: Evaluation of both lower extremities were performed at rest, including calculation of ankle-brachial indices with single level Doppler, pressure and pulse volume recording. COMPARISON:  None. FINDINGS: Right ABI:  1.2 Left ABI:  1.2 Right Lower Extremity:  Normal arterial waveforms at the ankle. Left Lower Extremity:  Normal arterial waveforms at the ankle. 1.0-1.4 Normal IMPRESSION: Normal bilateral resting ankle-brachial indices. Electronically Signed   By: Jacqulynn Cadet M.D.   On: 09/16/2021 11:50    US ARTERIAL LOWER EXTREMITY DUPLEX BILATERAL  Result Date: 09/16/2021 CLINICAL DATA:  Splenic artery aneurysm with significant family history of intracranial and popliteal aneurysms. Assess for popliteal aneurysmal disease. EXAM: BILATERAL LOWER EXTREMITY ARTERIAL DUPLEX SCAN TECHNIQUE: Gray-scale sonography as well as color Doppler and duplex ultrasound was performed to evaluate the arteries of both lower extremities including the common, superficial and profunda femoral arteries, popliteal artery and calf arteries. COMPARISON:  None. FINDINGS: Right Lower Extremity ABI: 1.2 Inflow: Normal common femoral arterial waveforms and velocities. No evidence of inflow (aortoiliac) disease. Outflow: Normal profunda femoral, superficial femoral and popliteal arterial waveforms and velocities. No focal elevation of the PSV to suggest stenosis. Runoff: Normal posterior and anterior tibial arterial waveforms and velocities. Vessels are patent to the ankle. Left Lower Extremity ABI: 1.2 Inflow: Normal common femoral arterial waveforms and velocities. No evidence of inflow (aortoiliac) disease. Outflow: Normal profunda femoral, superficial femoral and popliteal arterial waveforms and velocities. No focal elevation of the PSV to suggest stenosis. Runoff: Normal posterior and anterior tibial arterial waveforms and velocities. Vessels are patent to the ankle. IMPRESSION: 1. Negative for evidence of popliteal artery aneurysm. 2. Trace atherosclerotic plaque without stenosis or occlusion. 3. Normal bilateral resting ankle-brachial indices. Signed, Criselda Peaches, MD, [facility] Electronically Signed   By: Jacqulynn Cadet M.D.   On: 09/16/2021 11:17   ECHOCARDIOGRAM COMPLETE  Result Date: 09/02/2021    ECHOCARDIOGRAM REPORT   Patient Name:   Courtney Buckley Date of Exam: 09/02/2021 Medical Rec #:  [id number]           Height:       66.0 in Accession #:     [id number]          Weight:       181.0 lb Date of Birth:  03-21-1951           BSA:          1.917 m Patient Age:    73 years            BP:           102/57 mmHg Patient Gender: F                   HR:           82 bpm. Exam Location:  [place] Procedure: 2D Echo,  3D Echo, Cardiac Doppler, Color Doppler and Strain Analysis Indications:    I50.31 CHF  History:        Patient has prior history of Echocardiogram examinations, most                 recent 01/23/2021. CHF, Signs/Symptoms:Edema; Risk Factors:Former                 Smoker. Bone Marrow Cancer.  Sonographer:    Deliah Boston RDCS Referring Phys: Randa Evens [id number] IMPRESSIONS  1. Left ventricular ejection fraction, by estimation, is 65 to 70%. The left ventricle has normal function. The left ventricle has no regional wall motion abnormalities. Left ventricular diastolic parameters are consistent with Grade II diastolic dysfunction (pseudonormalization). The average left ventricular global longitudinal strain is 25.7 %. The global longitudinal strain is normal.  2. Right ventricular systolic function is normal. The right ventricular size is normal. There is moderately elevated pulmonary artery systolic pressure. The estimated right ventricular systolic pressure is 03.4 mmHg.  3. The mitral valve is normal in structure. Trivial mitral valve regurgitation. No evidence of mitral stenosis, the mean gradient is elevated but the valve opens well -- possible elevation in gradient due to high flow. The mean mitral valve gradient is 6.0 mmHg. Moderate mitral annular calcification.  4. The aortic valve is tricuspid. Aortic valve regurgitation is not visualized. No aortic stenosis is present, the mean gradient is elevated but the valve opens well -- possible elevation in gradient due to high flow (same as mitral valve). Aortic valve  area, by VTI measures 3.23 cm. Aortic valve mean gradient measures 17.0 mmHg.  5. Aortic dilatation noted. There is mild  dilatation of the ascending aorta, measuring 42 mm.  6. Left atrial size was severely dilated.  7. The inferior vena cava is dilated in size with <50% respiratory variability, suggesting right atrial pressure of 15 mmHg.  8. No definite ASD was visualized. FINDINGS  Left Ventricle: Left ventricular ejection fraction, by estimation, is 65 to 70%. The left ventricle has normal function. The left ventricle has no regional wall motion abnormalities. The average left ventricular global longitudinal strain is 25.7 %. The  global longitudinal strain is normal. The left ventricular internal cavity size was normal in size. There is no left ventricular hypertrophy. Left ventricular diastolic parameters are consistent with Grade II diastolic dysfunction (pseudonormalization). Right Ventricle: The right ventricular size is normal. No increase in right ventricular wall thickness. Right ventricular systolic function is normal. There is moderately elevated pulmonary artery systolic pressure. The tricuspid regurgitant velocity is 2.92 m/s, and with an assumed right atrial pressure of 15 mmHg, the estimated right ventricular systolic pressure is 74.2 mmHg. Left Atrium: Left atrial size was severely dilated. Right Atrium: Right atrial size was normal in size. Pericardium: There is no evidence of pericardial effusion. Mitral Valve: The mitral valve is normal in structure. Moderate mitral annular calcification. Trivial mitral valve regurgitation. No evidence of mitral valve stenosis. MV peak gradient, 14.7 mmHg. The mean mitral valve gradient is 6.0 mmHg. Tricuspid Valve: The tricuspid valve is normal in structure. Tricuspid valve regurgitation is trivial. Aortic Valve: The aortic valve is tricuspid. Aortic valve regurgitation is not visualized. No aortic stenosis is present. Aortic valve mean gradient measures 17.0 mmHg. Aortic valve peak gradient measures 27.8 mmHg. Aortic valve area, by VTI measures 3.23 cm. Pulmonic Valve: The  pulmonic valve was normal in structure. Pulmonic valve regurgitation is trivial. Aorta: Aortic dilatation noted. There is mild dilatation  of the ascending aorta, measuring 42 mm. Venous: The inferior vena cava is dilated in size with less than 50% respiratory variability, suggesting right atrial pressure of 15 mmHg. IAS/Shunts: No definite ASD was visualized.  LEFT VENTRICLE PLAX 2D LVIDd:         5.20 cm   Diastology LVIDs:         3.10 cm   LV e' medial:    11.40 cm/s LV PW:         0.90 cm   LV E/e' medial:  13.3 LV IVS:        1.10 cm   LV e' lateral:   15.70 cm/s LVOT diam:     2.40 cm   LV E/e' lateral: 9.7 LV SV:         177 LV SV Index:   92        2D Longitudinal Strain LVOT Area:     4.52 cm  2D Strain GLS (A2C):   26.2 %                          2D Strain GLS (A3C):   25.9 %                          2D Strain GLS (A4C):   24.9 %                          2D Strain GLS Avg:     25.7 %                           3D Volume EF:                          3D EF:        66 %                          LV EDV:       203 ml                          LV ESV:       69 ml                          LV SV:        134 ml RIGHT VENTRICLE RV S prime:     19.20 cm/s RVOT diam:      2.90 cm TAPSE (M-mode): 3.7 cm LEFT ATRIUM              Index        RIGHT ATRIUM           Index LA diam:        5.00 cm  2.61 cm/m   RA Area:     15.80 cm LA Vol (A2C):   143.0 ml 74.61 ml/m  RA Volume:   42.10 ml  21.96 ml/m LA Vol (A4C):   153.0 ml 79.82 ml/m LA Biplane Vol: 154.0 ml 80.34 ml/m  AORTIC VALVE AV Area (Vmax):    3.33 cm AV Area (Vmean):   3.25 cm AV Area (VTI):     3.23 cm AV Vmax:  263.80 cm/s AV Vmean:          181.800 cm/s AV VTI:            0.547 m AV Peak Grad:      27.8 mmHg AV Mean Grad:      17.0 mmHg LVOT Vmax:         194.00 cm/s LVOT Vmean:        130.500 cm/s LVOT VTI:          0.390 m LVOT/AV VTI ratio: 0.71  AORTA Ao Root diam: 3.50 cm Ao Asc diam:  4.15 cm MITRAL VALVE                TRICUSPID VALVE  MV Area (PHT): cm          TR Peak grad:   34.1 mmHg MV Area VTI:   2.48 cm     TR Vmax:        292.00 cm/s MV Peak grad:  14.7 mmHg MV Mean grad:  6.0 mmHg     SHUNTS MV Vmax:       1.92 m/s     Systemic VTI:  0.39 m MV Vmean:      107.0 cm/s   Systemic Diam: 2.40 cm MV Decel Time: 247 msec     Pulmonic Diam: 2.90 cm MV E velocity: 151.75 cm/s MV A velocity: 134.50 cm/s MV E/A ratio:  1.13 Dalton McleanMD Electronically signed by Franki Monte Signature Date/Time: 09/02/2021/6:22:17 PM    Final    MR ANGIO HEAD WO W CONTRAST  Result Date: 09/15/2021 CLINICAL DATA:  Visceral aneurysm, rule out cerebral aneurysm. EXAM: MRA HEAD WITHOUT AND WITH CONTRAST TECHNIQUE: Angiographic images of the Circle of Willis were obtained using MRA technique with and without intravenous contrast. On a professional basis, the patient should only be billed for a noncontrast intracranial MRA. CONTRAST:  7.93mL GADAVIST GADOBUTROL 1 MMOL/ML IV SOLN COMPARISON:  None. FINDINGS: ICA tortuosity at the skull base.  No beading or dissection. Negative for intracranial aneurysm, beading, or vascular malformation. No branch occlusion or stenosis. Postcontrast images of the arterial system were acquired and are noncontributory. Neck MRA images were inadvertently acquired and are negative. IMPRESSION: 1. Negative for cerebral aneurysm. 2. Inadvertent neck MRA which is also negative Electronically Signed   By: Jorje Guild M.D.   On: 09/15/2021 11:26   IR Radiologist Eval & Mgmt  Result Date: 09/09/2021 Please refer to notes tab for details about interventional procedure. (Op Note)   Labs:  CBC: Recent Labs    07/02/21 1538 08/26/21 0916 08/27/21 2301 09/03/21 1614  WBC 4.7 6.6 7.5 6.7  HGB 8.8* 10.1* 9.6* 8.6 Repeated and verified X2.*  HCT 27.9* 31.0* 29.8* 26.0*  PLT 174 264.0 232 230.0    COAGS: Recent Labs    07/02/21 1538 09/03/21 1614  INR 1.8* 1.5*    BMP: Recent Labs    07/02/21 1538 08/26/21 0916  08/27/21 2301 09/03/21 1614  NA 142 142 136 140  K 4.0 4.0 3.7 4.0  CL 111 109 107 107  CO2 24 26 24 30   GLUCOSE 84 95 136* 98  BUN 13 17 16 13   CALCIUM 8.3* 8.7 8.4* 8.4  CREATININE 0.64 0.58 0.47 0.61  GFRNONAA >60  --  >60  --     LIVER FUNCTION TESTS: Recent Labs    07/02/21 1538 08/26/21 0916 08/27/21 2301 09/03/21 1614  BILITOT 5.3* 4.6* 5.0* 4.7*  AST 31 36 37 21  ALT 19 23 24 15   ALKPHOS 77 90 74 82  PROT 4.9* 5.5* 5.6* 5.2*  ALBUMIN 3.3* 3.8 3.5 3.3*    TUMOR MARKERS: Recent Labs    09/03/21 1614  AFPTM 3.3    Assessment and Plan:  Enlarging splenic artery aneurysm Scheduled today for arteriogram with possible splenic artery aneurysm embolization in IR with Dr Laurence Ferrari Risks and benefits of  splenic artery aneurysm embolization were discussed with the patient including, but not limited to bleeding, infection, vascular injury or contrast induced renal failure.  This interventional procedure involves the use of X-rays and because of the nature of the planned procedure, it is possible that we will have prolonged use of X-ray fluoroscopy.  Potential radiation risks to you include (but are not limited to) the following: - A slightly elevated risk for cancer  several years later in life. This risk is typically less than 0.5% percent. This risk is low in comparison to the normal incidence of human cancer, which is 33% for women and 50% for men according to the [place]. - Radiation induced injury can include skin redness, resembling a rash, tissue breakdown / ulcers and hair loss (which can be temporary or permanent).   The likelihood of either of these occurring depends on the difficulty of the procedure and whether you are sensitive to radiation due to previous procedures, disease, or genetic conditions.   IF your procedure requires a prolonged use of radiation, you will be notified and given written instructions for further action.  It is  your responsibility to monitor the irradiated area for the 2 weeks following the procedure and to notify your physician if you are concerned that you have suffered a radiation induced injury.    All of the patient's questions were answered, patient is agreeable to proceed.  Consent signed and in chart.  Pt is aware she will be admitted overnight after procedure Plan for discharge in am She is agreeable   Thank you for this interesting consult.  I greatly enjoyed meeting Courtney Buckley and look forward to participating in their care.  A copy of this report was sent to the requesting provider on this date.  Electronically Signed: Lavonia Drafts, PA-C 09/18/2021, 11:21 AM   I spent a total of  30 Minutes   in face to face in clinical consultation, greater than 50% of which was counseling/coordinating care for splenic artery aneurysm embolization

## 2021-09-18 NOTE — Transfer of Care (Signed)
Immediate Anesthesia Transfer of Care Note  Patient: Courtney Buckley  Procedure(s) Performed: IR WITH ANESTHESIA SPLENIC ARTERY EMBOLIZATION  Patient Location: PACU  Anesthesia Type:General  Level of Consciousness: awake and alert   Airway & Oxygen Therapy: Patient Spontanous Breathing and Patient connected to face mask oxygen  Post-op Assessment: Report given to RN and Post -op Vital signs reviewed and stable  Post vital signs: Reviewed and stable  Last Vitals:  Vitals Value Taken Time  BP 139/64 09/18/21 1559  Temp    Pulse 68 09/18/21 1602  Resp 15 09/18/21 1602  SpO2 94 % 09/18/21 1602  Vitals shown include unvalidated device data.  Last Pain:  Vitals:   09/18/21 1122  TempSrc:   PainSc: 0-No pain         Complications: No notable events documented.

## 2021-09-18 NOTE — Sedation Documentation (Signed)
Right groin sheath removed. Kelt closure device deployed by radiologist.

## 2021-09-18 NOTE — Anesthesia Preprocedure Evaluation (Signed)
Anesthesia Evaluation  Patient identified by MRN, date of birth, ID band Patient awake    Reviewed: Allergy & Precautions, NPO status , Patient's Chart, lab work & pertinent test results  History of Anesthesia Complications Negative for: history of anesthetic complications  Airway Mallampati: III  TM Distance: >3 FB Neck ROM: Full    Dental  (+) Dental Advisory Given, Teeth Intact, Missing,    Pulmonary neg shortness of breath, neg sleep apnea, neg COPD, neg recent URI, former smoker,    breath sounds clear to auscultation       Cardiovascular negative cardio ROS   Rhythm:Regular     Neuro/Psych  Headaches, neg Seizures PSYCHIATRIC DISORDERS Depression    GI/Hepatic negative GI ROS, (+) Cirrhosis   Esophageal Varices    , PVT (portal vein thrombosis)  Lab Results      Component                Value               Date                      ALT                      15                  09/03/2021                AST                      21                  09/03/2021                ALKPHOS                  82                  09/03/2021                BILITOT                  4.7 (H)             09/03/2021              Endo/Other  negative endocrine ROS  Renal/GU negative Renal ROS     Musculoskeletal negative musculoskeletal ROS (+)   Abdominal   Peds  Hematology  (+) Blood dyscrasia, anemia , Lab Results      Component                Value               Date                      WBC                      7.2                 09/18/2021                HGB                      10.3 (L)            09/18/2021  HCT                      34.7 (L)            09/18/2021                MCV                      95.1                09/18/2021                PLT                      258                 09/18/2021              Anesthesia Other Findings   Reproductive/Obstetrics                              Anesthesia Physical Anesthesia Plan  ASA: 3  Anesthesia Plan: General   Post-op Pain Management:    Induction: Intravenous  PONV Risk Score and Plan: 3 and Ondansetron and Dexamethasone  Airway Management Planned: Oral ETT  Additional Equipment: None  Intra-op Plan:   Post-operative Plan: Extubation in OR  Informed Consent: I have reviewed the patients History and Physical, chart, labs and discussed the procedure including the risks, benefits and alternatives for the proposed anesthesia with the patient or authorized representative who has indicated his/her understanding and acceptance.     Dental advisory given  Plan Discussed with: CRNA and Anesthesiologist  Anesthesia Plan Comments:         Anesthesia Quick Evaluation

## 2021-09-18 NOTE — Anesthesia Procedure Notes (Signed)
Procedure Name: Intubation Date/Time: 09/18/2021 12:53 PM Performed by: Lorie Phenix, CRNA Pre-anesthesia Checklist: Patient identified, Emergency Drugs available, Suction available and Patient being monitored Patient Re-evaluated:Patient Re-evaluated prior to induction Oxygen Delivery Method: Circle system utilized Preoxygenation: Pre-oxygenation with 100% oxygen Induction Type: IV induction Ventilation: Mask ventilation without difficulty Laryngoscope Size: Mac and 3 Grade View: Grade I Tube type: Oral Tube size: 7.0 mm Number of attempts: 1 Airway Equipment and Method: Stylet and Oral airway Placement Confirmation: ETT inserted through vocal cords under direct vision, positive ETCO2 and breath sounds checked- equal and bilateral Secured at: 22 cm Tube secured with: Tape Dental Injury: Teeth and Oropharynx as per pre-operative assessment

## 2021-09-19 ENCOUNTER — Encounter (HOSPITAL_COMMUNITY): Payer: Self-pay | Admitting: Interventional Radiology

## 2021-09-19 ENCOUNTER — Other Ambulatory Visit: Payer: Self-pay

## 2021-09-19 LAB — CBC WITH DIFFERENTIAL/PLATELET
Abs Immature Granulocytes: 0.39 10*3/uL — ABNORMAL HIGH (ref 0.00–0.07)
Basophils Absolute: 0.3 10*3/uL — ABNORMAL HIGH (ref 0.0–0.1)
Basophils Relative: 1 %
Eosinophils Absolute: 0.1 10*3/uL (ref 0.0–0.5)
Eosinophils Relative: 0 %
HCT: 30.1 % — ABNORMAL LOW (ref 36.0–46.0)
Hemoglobin: 9.7 g/dL — ABNORMAL LOW (ref 12.0–15.0)
Immature Granulocytes: 1 %
Lymphocytes Relative: 4 %
Lymphs Abs: 1.1 10*3/uL (ref 0.7–4.0)
MCH: 29.1 pg (ref 26.0–34.0)
MCHC: 32.2 g/dL (ref 30.0–36.0)
MCV: 90.4 fL (ref 80.0–100.0)
Monocytes Absolute: 1 10*3/uL (ref 0.1–1.0)
Monocytes Relative: 3 %
Neutro Abs: 28.2 10*3/uL — ABNORMAL HIGH (ref 1.7–7.7)
Neutrophils Relative %: 91 %
Platelets: 377 10*3/uL (ref 150–400)
RBC: 3.33 MIL/uL — ABNORMAL LOW (ref 3.87–5.11)
RDW: 23.6 % — ABNORMAL HIGH (ref 11.5–15.5)
WBC: 31.1 10*3/uL — ABNORMAL HIGH (ref 4.0–10.5)
nRBC: 0.4 % — ABNORMAL HIGH (ref 0.0–0.2)

## 2021-09-19 LAB — BASIC METABOLIC PANEL
Anion gap: 5 (ref 5–15)
BUN: 10 mg/dL (ref 8–23)
CO2: 22 mmol/L (ref 22–32)
Calcium: 8.2 mg/dL — ABNORMAL LOW (ref 8.9–10.3)
Chloride: 112 mmol/L — ABNORMAL HIGH (ref 98–111)
Creatinine, Ser: 0.66 mg/dL (ref 0.44–1.00)
GFR, Estimated: >60 mL/min (ref >60)
Glucose, Bld: 151 mg/dL — ABNORMAL HIGH (ref 70–99)
Potassium: 4.1 mmol/L (ref 3.5–5.1)
Sodium: 139 mmol/L (ref 135–145)

## 2021-09-19 MED ORDER — PREDNISONE 10 MG PO TABS
10.0000 mg | ORAL_TABLET | Freq: Two times a day (BID) | ORAL | Status: AC
  Administered 2021-09-19 (×2): 10 mg via ORAL
  Filled 2021-09-19 (×2): qty 1

## 2021-09-19 MED ORDER — PREDNISONE 10 MG PO TABS: 10.0000 mg | ORAL_TABLET | Freq: Every day | ORAL | Status: DC

## 2021-09-19 MED ORDER — PREDNISONE 10 MG PO TABS
10.0000 mg | ORAL_TABLET | Freq: Four times a day (QID) | ORAL | Status: DC
  Filled 2021-09-19: qty 1

## 2021-09-19 MED ORDER — PREDNISONE 10 MG PO TABS
10.0000 mg | ORAL_TABLET | Freq: Three times a day (TID) | ORAL | Status: DC
  Administered 2021-09-20 (×2): 10 mg via ORAL
  Filled 2021-09-19 (×2): qty 1

## 2021-09-19 MED ORDER — HYDROMORPHONE HCL 1 MG/ML IJ SOLN
1.0000 mg | INTRAMUSCULAR | Status: DC | PRN
  Administered 2021-09-19 – 2021-09-20 (×3): 1 mg via INTRAVENOUS
  Filled 2021-09-19 (×3): qty 1

## 2021-09-19 MED ORDER — PREDNISONE 10 MG (21) PO TBPK: 10.0000 mg | ORAL_TABLET | Freq: Four times a day (QID) | ORAL | Status: DC

## 2021-09-19 MED ORDER — PREDNISONE 10 MG (21) PO TBPK: 20.0000 mg | ORAL_TABLET | Freq: Every evening | ORAL | Status: DC

## 2021-09-19 MED ORDER — PREDNISONE 10 MG (21) PO TBPK
20.0000 mg | ORAL_TABLET | Freq: Every evening | ORAL | Status: DC
Start: 2021-09-19 — End: 2021-09-19

## 2021-09-19 MED ORDER — PREDNISONE 10 MG PO TABS: 10.0000 mg | ORAL_TABLET | Freq: Three times a day (TID) | ORAL | Status: DC

## 2021-09-19 MED ORDER — PREDNISONE 10 MG (21) PO TBPK: 20.0000 mg | ORAL_TABLET | Freq: Every morning | ORAL | Status: DC

## 2021-09-19 MED ORDER — PREDNISONE 10 MG PO TABS: 10.0000 mg | ORAL_TABLET | Freq: Two times a day (BID) | ORAL | Status: DC

## 2021-09-19 MED ORDER — PREDNISONE 10 MG (21) PO TBPK: 10.0000 mg | ORAL_TABLET | Freq: Three times a day (TID) | ORAL | Status: DC

## 2021-09-19 MED ORDER — PREDNISONE 10 MG (21) PO TBPK
10.0000 mg | ORAL_TABLET | ORAL | Status: DC
Start: 2021-09-19 — End: 2021-09-19

## 2021-09-19 MED ORDER — MELATONIN 3 MG PO TABS
3.0000 mg | ORAL_TABLET | Freq: Every day | ORAL | Status: DC
  Administered 2021-09-19: 3 mg via ORAL
  Filled 2021-09-19: qty 1

## 2021-09-19 MED ORDER — PREDNISONE 20 MG PO TABS
20.0000 mg | ORAL_TABLET | Freq: Every day | ORAL | Status: DC
  Administered 2021-09-19: 20 mg via ORAL
  Filled 2021-09-19: qty 1

## 2021-09-19 MED ORDER — PREDNISONE 10 MG (21) PO TBPK: 10.0000 mg | ORAL_TABLET | ORAL | Status: DC

## 2021-09-19 NOTE — Progress Notes (Signed)
Referring Physician(s): Iruku,P/Pyrtle,J  Supervising Physician: Juliet Rude  Patient Status:  [facility] - In-pt  Chief Complaint:  Splenic artery aneurysm  Subjective: Patient currently complaining of some left upper quadrant discomfort with radiation to the lateral abdominal region and flank; she rates pain as a 6 out of 10.  She has had oral narcotic this morning.  She also remains on steroids and NSAIDs.  She denies fever, headache, chest pain, dyspnea, cough, nausea, vomiting or bleeding.  She did have 2 episodes of nausea and vomiting yesterday post procedure.   Allergies: Patient has no known allergies.  Medications: Prior to Admission medications   Medication Sig Start Date End Date Taking? Authorizing Provider  furosemide (LASIX) 20 MG tablet Take 1 tablet (20 mg total) by mouth 2 (two) times daily. Patient taking differently: Take 20 mg by mouth as needed for fluid or edema. 07/14/21 09/18/21 Yes Allwardt, Randa Evens, PA-C  HYDROcodone-acetaminophen (NORCO) 10-325 MG tablet Take 1 tablet by mouth every 6 (six) hours as needed for moderate pain or severe pain.   Yes [provider]  JAKAFI 10 MG tablet Take 10 mg by mouth 2 (two) times daily. 06/11/21  Yes [provider]  lactulose (CHRONULAC) 10 GM/15ML solution Take 45 ml daily 09/05/21  Yes Esterwood, Amy S, PA-C  promethazine (PHENERGAN) 12.5 MG tablet Take 12.5 mg by mouth every 6 (six) hours as needed for nausea or vomiting.   Yes [provider]  rifaximin (XIFAXAN) 550 MG TABS tablet Take 1 tablet (550 mg total) by mouth 2 (two) times daily. Patient not taking: Reported on 09/15/2021 09/05/21   Esterwood, Amy S, PA-C     Vital Signs: BP (!) 117/53 (BP Location: Right Arm)   Pulse 83   Temp 98.2 F (36.8 C)   Resp 18   Ht 5\' 6"  (1.676 m)   Wt 180 lb (81.6 kg)   SpO2 96%   BMI 29.05 kg/m   Physical Exam awake, alert.  Chest clear to auscultation bilaterally.  Heart with regular  rate and rhythm, positive murmur.  Abdomen soft, positive bowel sounds, mild to moderately tender left upper quadrant /left lateral abdominal regions to palpation; puncture site right common femoral artery soft, clean, dry, nontender, no hematoma, intact distal pulses, no lower extremity edema.  Imaging: IR Angiogram Visceral Selective  Result Date: 09/18/2021 INDICATION: 70 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 She presents for splenic angiogram and embolization of the aneurysm. EXAM: IR EMBO ARTERIAL NOT HEMORR HEMANG INC GUIDE ROADMAPPING; ADDITIONAL ARTERIOGRAPHY; IR ULTRASOUND GUIDANCE VASC ACCESS RIGHT; SELECTIVE VISCERAL ARTERIOGRAPHY MEDICATIONS: 500 mL Levaquin. Additionally, 10 mg Decadron and 30 mg Toradol were administered during the procedure. The antibiotic was administered within 1 hour of the procedure ANESTHESIA/SEDATION: General anesthesia. CONTRAST:  48mL OMNIPAQUE IOHEXOL 300 MG/ML SOLN, 76mL OMNIPAQUE IOHEXOL 300 MG/ML SOLN FLUOROSCOPY TIME:  Fluoroscopy Time: 37 minutes 6 seconds (4187 mGy). COMPLICATIONS: None immediate. PROCEDURE: Informed consent was obtained from the patient following explanation of the procedure, risks, benefits and alternatives. The patient understands, agrees and consents for the procedure. All questions were addressed. A time out was performed prior to the initiation of the procedure. Maximal barrier sterile technique utilized including caps, mask, sterile gowns, sterile gloves, large sterile drape, hand hygiene, and Betadine prep. The right common femoral artery was interrogated with ultrasound and found to be widely patent. An image was obtained and stored for the medical record. Local anesthesia was attained by  infiltration with 1% lidocaine. A small dermatotomy was made. Under real-time sonographic guidance, the vessel was punctured with a 21 gauge micropuncture needle. Using standard technique, the initial  micro needle was exchanged over a 0.018 micro wire for a transitional 4 [place] micro sheath. The micro sheath was then exchanged over a 0.035 wire for a 5 French vascular sheath. A C2 cobra catheter was used to select the celiac artery. A celiac arteriogram was performed. Conventional celiac axis anatomy. The approximately 3.2 cm aneurysm of the distal splenic artery was easily identified. A glidewire was advanced more distally into the splenic artery. The C2 cobra catheter was advanced into the mid aspect of the splenic artery. The glidewire was exchanged for a Rosen wire. The 5 [place] vascular sheath was exchanged for a 6 French 45 cm to rubor destination sheath. This was advanced into the proximal splenic artery. The Cobra catheter was then exchanged for a 5 French glide catheter which was then navigated even more distally into the splenic artery. One is much distal progression as possible could be made, a lantern microcatheter was introduced over a Fathom 16 wire in used to navigate into the aneurysm sac, and ultimately through the aneurysm sac into the outflow vessels. As seen on prior CTA imaging, the outflow vessel bifurcates after a short distance in provide supply to both the mid and inferior aspect of the spleen. A more proximal a branch arising from the mid splenic artery provides flow to the upper pole of the spleen. Once the microcatheter was successfully navigated into the lower pole branch. Coil embolization of the outflow arteries was performed using a series of penumbra detachable microcoils. Once the outflow arteries had been embolized into the aneurysm sac, the aneurysm sac itself was stabilized by placement of two 36 mm and two 32 mm penumbra framing coils. Once the aneurysm was successfully framed, a 14 mm fibered interlock detachable coil was partially deployed on the back bench and soaked in thrombin solution. The catheter was then re- sheathed and successfully deployed into the center of the  framed aneurysm. The inflow artery was then coil embolized using a series of penumbra coils terminating with a 45 cm liquid metal coil. Contrast injection performed at this time confirms no antegrade flow into the aneurysm sac and only retrograde flow in the splenic artery. Therefore, the microcatheter was removed. Injections were performed through the 5 French catheter in the more proximal splenic artery as well as through the 6 French sheath in the origin of the celiac artery. The proximal and mid splenic artery remain patent. The proximal branch to the upper pole is patent with excellent contrast blush of the upper pole of the spleen. Unfortunately, there is no significant reconstitution of more distal splenic branches resulting in absent parenchymal blush in the mid and distal spleen. 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 IMPRESSION: 1. Successful coil embolization of distal splenic artery aneurysm with no evidence of residual flow into the aneurysm sac. 2. This results in devascularization of approximately 2/3 of the splenic volume, predominantly in the mid and lower pole. 3. Persistent parenchymal opacification of the upper pole of the spleen due to a more proximal upper pole branch. PLAN: 1. 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 2. We will manage her aggressively with steroids, NSAIDs and normal caudate pain medications as needed. 3. Monitor signs and symptoms of post embolization syndrome. 4.  Intravenous Levaquin while an inpatient. Upon discharge patient will complete a 2 week course of moxifloxacin 500 mg daily Electronically Signed   By: Jacqulynn Cadet M.D.   On: 09/18/2021 19:41   IR Angiogram Selective Each Additional Vessel  Result Date: 09/18/2021 INDICATION: 70 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 She presents for  splenic angiogram and embolization of the aneurysm. EXAM: IR EMBO ARTERIAL NOT HEMORR HEMANG INC GUIDE ROADMAPPING; ADDITIONAL ARTERIOGRAPHY; IR ULTRASOUND GUIDANCE VASC ACCESS RIGHT; SELECTIVE VISCERAL ARTERIOGRAPHY MEDICATIONS: 500 mL Levaquin. Additionally, 10 mg Decadron and 30 mg Toradol were administered during the procedure. The antibiotic was administered within 1 hour of the procedure ANESTHESIA/SEDATION: General anesthesia. CONTRAST:  19mL OMNIPAQUE IOHEXOL 300 MG/ML SOLN, 24mL OMNIPAQUE IOHEXOL 300 MG/ML SOLN FLUOROSCOPY TIME:  Fluoroscopy Time: 37 minutes 6 seconds (4187 mGy). COMPLICATIONS: None immediate. PROCEDURE: Informed consent was obtained from the patient following explanation of the procedure, risks, benefits and alternatives. The patient understands, agrees and consents for the procedure. All questions were addressed. A time out was performed prior to the initiation of the procedure. Maximal barrier sterile technique utilized including caps, mask, sterile gowns, sterile gloves, large sterile drape, hand hygiene, and Betadine prep. The right common femoral artery was interrogated with ultrasound and found to be widely patent. An image was obtained and stored for the medical record. Local anesthesia was attained by infiltration with 1% lidocaine. A small dermatotomy was made. Under real-time sonographic guidance, the vessel was punctured with a 21 gauge micropuncture needle. Using standard technique, the initial micro needle was exchanged over a 0.018 micro wire for a transitional 4 [place] micro sheath. The micro sheath was then exchanged over a 0.035 wire for a 5 French vascular sheath. A C2 cobra catheter was used to select the celiac artery. A celiac arteriogram was performed. Conventional celiac axis anatomy. The approximately 3.2 cm aneurysm of the distal splenic artery was easily identified. A glidewire was advanced more distally into the splenic artery. The C2 cobra catheter was advanced  into the mid aspect of the splenic artery. The glidewire was exchanged for a Rosen wire. The 5 [place] vascular sheath was exchanged for a 6 French 45 cm to rubor destination sheath. This was advanced into the proximal splenic artery. The Cobra catheter was then exchanged for a 5 French glide catheter which was then navigated even more distally into the splenic artery. One is much distal progression as possible could be made, a lantern microcatheter was introduced over a Fathom 16 wire in used to navigate into the aneurysm sac, and ultimately through the aneurysm sac into the outflow vessels. As seen on prior CTA imaging, the outflow vessel bifurcates after a short distance in provide supply to both the mid and inferior aspect of the spleen. A more proximal a branch arising from the mid splenic artery provides flow to the upper pole of the spleen. Once the microcatheter was successfully navigated into the lower pole branch. Coil embolization of the outflow arteries was performed using a series of penumbra detachable microcoils. Once the outflow arteries had been embolized into the aneurysm sac, the aneurysm sac itself was stabilized by placement of two 36 mm and two 32 mm penumbra framing coils. Once the aneurysm was successfully framed, a 14 mm fibered interlock detachable coil was partially deployed on the back bench and soaked in thrombin solution. The catheter was then re- sheathed and successfully deployed into the center of the framed aneurysm. The inflow artery was  then coil embolized using a series of penumbra coils terminating with a 45 cm liquid metal coil. Contrast injection performed at this time confirms no antegrade flow into the aneurysm sac and only retrograde flow in the splenic artery. Therefore, the microcatheter was removed. Injections were performed through the 5 French catheter in the more proximal splenic artery as well as through the 6 French sheath in the origin of the celiac artery. The  proximal and mid splenic artery remain patent. The proximal branch to the upper pole is patent with excellent contrast blush of the upper pole of the spleen. Unfortunately, there is no significant reconstitution of more distal splenic branches resulting in absent parenchymal blush in the mid and distal spleen. 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 IMPRESSION: 1. Successful coil embolization of distal splenic artery aneurysm with no evidence of residual flow into the aneurysm sac. 2. This results in devascularization of approximately 2/3 of the splenic volume, predominantly in the mid and lower pole. 3. Persistent parenchymal opacification of the upper pole of the spleen due to a more proximal upper pole branch. PLAN: 1. 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 2. We will manage her aggressively with steroids, NSAIDs and normal caudate pain medications as needed. 3. Monitor signs and symptoms of post embolization syndrome. 4. Intravenous Levaquin while an inpatient. Upon discharge patient will complete a 2 week course of moxifloxacin 500 mg daily Electronically Signed   By: Jacqulynn Cadet M.D.   On: 09/18/2021 19:41   US ARTERIAL ABI (SCREENING LOWER EXTREMITY)  Result Date: 09/16/2021 CLINICAL DATA:  Family history of popliteal artery aneurysms EXAM: NONINVASIVE PHYSIOLOGIC VASCULAR STUDY OF BILATERAL LOWER EXTREMITIES TECHNIQUE: Evaluation of both lower extremities were performed at rest, including calculation of ankle-brachial indices with single level Doppler, pressure and pulse volume recording. COMPARISON:  None. FINDINGS: Right ABI:  1.2 Left ABI:  1.2 Right Lower Extremity:  Normal arterial waveforms at the ankle. Left Lower Extremity:  Normal arterial waveforms at the ankle. 1.0-1.4 Normal IMPRESSION: Normal bilateral resting ankle-brachial indices. Electronically Signed   By:  Jacqulynn Cadet M.D.   On: 09/16/2021 11:50   US ARTERIAL LOWER EXTREMITY DUPLEX BILATERAL  Result Date: 09/16/2021 CLINICAL DATA:  Splenic artery aneurysm with significant family history of intracranial and popliteal aneurysms. Assess for popliteal aneurysmal disease. EXAM: BILATERAL LOWER EXTREMITY ARTERIAL DUPLEX SCAN TECHNIQUE: Gray-scale sonography as well as color Doppler and duplex ultrasound was performed to evaluate the arteries of both lower extremities including the common, superficial and profunda femoral arteries, popliteal artery and calf arteries. COMPARISON:  None. FINDINGS: Right Lower Extremity ABI: 1.2 Inflow: Normal common femoral arterial waveforms and velocities. No evidence of inflow (aortoiliac) disease. Outflow: Normal profunda femoral, superficial femoral and popliteal arterial waveforms and velocities. No focal elevation of the PSV to suggest stenosis. Runoff: Normal posterior and anterior tibial arterial waveforms and velocities. Vessels are patent to the ankle. Left Lower Extremity ABI: 1.2 Inflow: Normal common femoral arterial waveforms and velocities. No evidence of inflow (aortoiliac) disease. Outflow: Normal profunda femoral, superficial femoral and popliteal arterial waveforms and velocities. No focal elevation of the PSV to suggest stenosis. Runoff: Normal posterior and anterior tibial arterial waveforms and velocities. Vessels are patent to the ankle. IMPRESSION: 1. Negative for evidence of popliteal artery aneurysm. 2. Trace atherosclerotic plaque without stenosis or occlusion. 3. Normal bilateral resting ankle-brachial indices. Signed, Criselda Peaches, MD, [place]  [facility] Electronically Signed   By: Jacqulynn Cadet M.D.   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  She presents for splenic angiogram and embolization of the aneurysm. EXAM: IR EMBO ARTERIAL NOT HEMORR HEMANG INC GUIDE ROADMAPPING; ADDITIONAL ARTERIOGRAPHY; IR ULTRASOUND GUIDANCE VASC ACCESS RIGHT; SELECTIVE VISCERAL ARTERIOGRAPHY MEDICATIONS: 500 mL Levaquin. Additionally, 10 mg Decadron and 30 mg Toradol were administered during the procedure. The antibiotic was administered within 1 hour of the procedure ANESTHESIA/SEDATION: General anesthesia. CONTRAST:  33mL OMNIPAQUE IOHEXOL 300 MG/ML SOLN, 44mL OMNIPAQUE IOHEXOL 300 MG/ML SOLN FLUOROSCOPY TIME:  Fluoroscopy Time: 37 minutes 6 seconds (4187 mGy). COMPLICATIONS: None immediate. PROCEDURE: Informed consent was obtained from the patient following explanation of the procedure, risks, benefits and alternatives. The patient understands, agrees and consents for the procedure. All questions were addressed. A time out was performed prior to the initiation of the procedure. Maximal barrier sterile technique utilized including caps, mask, sterile gowns, sterile gloves, large sterile drape, hand hygiene, and Betadine prep. The right common femoral artery was interrogated with ultrasound and found to be widely patent. An image was obtained and stored for the medical record. Local anesthesia was attained by infiltration with 1% lidocaine. A small dermatotomy was made. Under real-time sonographic guidance, the vessel was punctured with a 21 gauge micropuncture needle. Using standard technique, the initial micro needle was exchanged over a 0.018 micro wire for a transitional 4 [place] micro sheath. The micro sheath was then exchanged over a 0.035 wire for a 5 French vascular sheath. A C2 cobra catheter was used to select the celiac artery. A celiac arteriogram was performed. Conventional celiac axis anatomy. The approximately 3.2 cm aneurysm of the distal splenic artery was easily identified. A glidewire was advanced more distally into the splenic artery. The C2 cobra  catheter was advanced into the mid aspect of the splenic artery. The glidewire was exchanged for a Rosen wire. The 5 [place] vascular sheath was exchanged for a 6 French 45 cm to rubor destination sheath. This was advanced into the proximal splenic artery. The Cobra catheter was then exchanged for a 5 French glide catheter which was then navigated even more distally into the splenic artery. One is much distal progression as possible could be made, a lantern microcatheter was introduced over a Fathom 16 wire in used to navigate into the aneurysm sac, and ultimately through the aneurysm sac into the outflow vessels. As seen on prior CTA imaging, the outflow vessel bifurcates after a short distance in provide supply to both the mid and inferior aspect of the spleen. A more proximal a branch arising from the mid splenic artery provides flow to the upper pole of the spleen. Once the microcatheter was successfully navigated into the lower pole branch. Coil embolization of the outflow arteries was performed using a series of penumbra detachable microcoils. Once the outflow arteries had been embolized into the aneurysm sac, the aneurysm sac itself was stabilized by placement of two 36 mm and two 32 mm penumbra framing coils. Once the aneurysm was successfully framed, a 14 mm fibered interlock detachable coil was partially deployed on the back bench and soaked in thrombin solution. The catheter was then re- sheathed and successfully deployed into the center of the framed aneurysm. The inflow artery was then coil embolized using a series of penumbra coils terminating with a  45 cm liquid metal coil. Contrast injection performed at this time confirms no antegrade flow into the aneurysm sac and only retrograde flow in the splenic artery. Therefore, the microcatheter was removed. Injections were performed through the 5 French catheter in the more proximal splenic artery as well as through the 6 French sheath in the origin of the  celiac artery. The proximal and mid splenic artery remain patent. The proximal branch to the upper pole is patent with excellent contrast blush of the upper pole of the spleen. Unfortunately, there is no significant reconstitution of more distal splenic branches resulting in absent parenchymal blush in the mid and distal spleen. 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 IMPRESSION: 1. Successful coil embolization of distal splenic artery aneurysm with no evidence of residual flow into the aneurysm sac. 2. This results in devascularization of approximately 2/3 of the splenic volume, predominantly in the mid and lower pole. 3. Persistent parenchymal opacification of the upper pole of the spleen due to a more proximal upper pole branch. PLAN: 1. 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 2. We will manage her aggressively with steroids, NSAIDs and normal caudate pain medications as needed. 3. Monitor signs and symptoms of post embolization syndrome. 4. Intravenous Levaquin while an inpatient. Upon discharge patient will complete a 2 week course of moxifloxacin 500 mg daily Electronically Signed   By: Jacqulynn Cadet M.D.   On: 09/18/2021 19:41   IR EMBO ARTERIAL NOT HEMORR HEMANG INC GUIDE ROADMAPPING  Result Date: 09/18/2021 Criselda Peaches, MD     09/18/2021  4:13 PM Interventional Radiology Procedure Note Procedure: Splenic aneurysm embolization Complications: None Estimated Blood Loss: None Recommendations: - Admit for obs - Hydrate - Abx - Prepare for and manage post embolization syndrome (steroid taper, NSAIDs, pain control) - ADAT Signed, Criselda Peaches, MD    Labs:  CBC: Recent Labs    08/27/21 2301 09/03/21 1614 09/18/21 1140 09/19/21 0132  WBC 7.5 6.7 7.2 31.1*  HGB 9.6* 8.6 Repeated and verified X2.* 10.3* 9.7*  HCT 29.8* 26.0* 34.7* 30.1*  PLT 232 230.0 258  377    COAGS: Recent Labs    07/02/21 1538 09/03/21 1614 09/18/21 1140  INR 1.8* 1.5* 1.7*    BMP: Recent Labs    07/02/21 1538 08/26/21 0916 08/27/21 2301 09/03/21 1614 09/18/21 1140 09/19/21 0132  NA 142   < > 136 140 143 139  K 4.0   < > 3.7 4.0 4.1 4.1  CL 111   < > 107 107 113* 112*  CO2 24   < > 24 30 23 22   GLUCOSE 84   < > 136* 98 92 151*  BUN 13   < > 16 13 12 10   CALCIUM 8.3*   < > 8.4* 8.4 8.4* 8.2*  CREATININE 0.64   < > 0.47 0.61 0.61 0.66  GFRNONAA >60  --  >60  --  >60 >60   < > = values in this interval not displayed.    LIVER FUNCTION TESTS: Recent Labs    07/02/21 1538 08/26/21 0916 08/27/21 2301 09/03/21 1614  BILITOT 5.3* 4.6* 5.0* 4.7*  AST 31 36 37 21  ALT 19 23 24 15   ALKPHOS 77 90 74 82  PROT 4.9* 5.5* 5.6* 5.2*  ALBUMIN 3.3* 3.8 3.5 3.3*    Assessment and Plan: Patient with past medical history significant for cirrhosis,  depression, esophageal varices, portal hypertension, chronic portal vein thrombosis with cavernous transformation and marked splenorenal portosystemic collateralization in addition to JAK2 mutation myelofibrosis and splenomegaly along with enlarging splenic artery aneurysm.  Status post coil embolization of distal splenic artery aneurysm on 11/17 resulting in devascularization of approximately two thirds of the splenic volume; currently afebrile, WBC 31.1, hemoglobin 9.7 down slightly from 10.3, platelets normal, creatinine normal; continue current treatment, may have heating pad as needed to left upper quadrant; will add 1 mg Dilaudid IV for breakthrough pain as needed.  Initiate prednisone Dosepak.  Above discussed with Dr. Laurence Ferrari.  Plan to continue patient hospital stay for now.  Electronically Signed: D. Rowe Robert, PA-C 09/19/2021, 8:54 AM   I spent a total of 20 minutes at the the patient's bedside AND on the patient's hospital floor or unit, greater than 50% of which was counseling/coordinating care for  splenic artery aneurysm coil embolization    Patient ID: Courtney Buckley, female   DOB: 1950-12-23, 70 y.o.   MRN: [id number]

## 2021-09-19 NOTE — Anesthesia Postprocedure Evaluation (Signed)
Anesthesia Post Note  Patient: Courtney Buckley  Procedure(s) Performed: IR WITH ANESTHESIA SPLENIC ARTERY EMBOLIZATION     Patient location during evaluation: PACU Anesthesia Type: General Level of consciousness: awake and alert Pain management: pain level controlled Vital Signs Assessment: post-procedure vital signs reviewed and stable Respiratory status: spontaneous breathing, nonlabored ventilation, respiratory function stable and patient connected to nasal cannula oxygen Cardiovascular status: blood pressure returned to baseline and stable Postop Assessment: no apparent nausea or vomiting Anesthetic complications: no   No notable events documented.  Last Vitals:  Vitals:   09/19/21 0604 09/19/21 0809  BP: (!) 119/57 (!) 117/53  Pulse: 85 83  Resp: 18 18  Temp: 36.9 C 36.8 C  SpO2: 95% 96%    Last Pain:  Vitals:   09/19/21 0900  TempSrc:   PainSc: 6                  Dallas Torok

## 2021-09-20 ENCOUNTER — Other Ambulatory Visit: Payer: Self-pay | Admitting: Radiology

## 2021-09-20 DIAGNOSIS — I728 Aneurysm of other specified arteries: Secondary | ICD-10-CM

## 2021-09-20 LAB — CBC WITH DIFFERENTIAL/PLATELET
Abs Immature Granulocytes: 0.22 10*3/uL — ABNORMAL HIGH (ref 0.00–0.07)
Basophils Absolute: 0.1 10*3/uL (ref 0.0–0.1)
Basophils Relative: 1 %
Eosinophils Absolute: 0 10*3/uL (ref 0.0–0.5)
Eosinophils Relative: 0 %
HCT: 28.3 % — ABNORMAL LOW (ref 36.0–46.0)
Hemoglobin: 8.6 g/dL — ABNORMAL LOW (ref 12.0–15.0)
Immature Granulocytes: 1 %
Lymphocytes Relative: 2 %
Lymphs Abs: 0.5 10*3/uL — ABNORMAL LOW (ref 0.7–4.0)
MCH: 28.1 pg (ref 26.0–34.0)
MCHC: 30.4 g/dL (ref 30.0–36.0)
MCV: 92.5 fL (ref 80.0–100.0)
Monocytes Absolute: 0.9 10*3/uL (ref 0.1–1.0)
Monocytes Relative: 4 %
Neutro Abs: 22.4 10*3/uL — ABNORMAL HIGH (ref 1.7–7.7)
Neutrophils Relative %: 92 %
Platelets: 329 10*3/uL (ref 150–400)
RBC: 3.06 MIL/uL — ABNORMAL LOW (ref 3.87–5.11)
RDW: 24 % — ABNORMAL HIGH (ref 11.5–15.5)
WBC: 24.2 10*3/uL — ABNORMAL HIGH (ref 4.0–10.5)
nRBC: 0.2 % (ref 0.0–0.2)

## 2021-09-20 LAB — COMPREHENSIVE METABOLIC PANEL
ALT: 21 U/L (ref 0–44)
AST: 54 U/L — ABNORMAL HIGH (ref 15–41)
Albumin: 2.7 g/dL — ABNORMAL LOW (ref 3.5–5.0)
Alkaline Phosphatase: 66 U/L (ref 38–126)
Anion gap: 5 (ref 5–15)
BUN: 22 mg/dL (ref 8–23)
CO2: 22 mmol/L (ref 22–32)
Calcium: 8.2 mg/dL — ABNORMAL LOW (ref 8.9–10.3)
Chloride: 109 mmol/L (ref 98–111)
Creatinine, Ser: 0.77 mg/dL (ref 0.44–1.00)
GFR, Estimated: >60 mL/min (ref >60)
Glucose, Bld: 150 mg/dL — ABNORMAL HIGH (ref 70–99)
Potassium: 4.7 mmol/L (ref 3.5–5.1)
Sodium: 136 mmol/L (ref 135–145)
Total Bilirubin: 2.1 mg/dL — ABNORMAL HIGH (ref 0.3–1.2)
Total Protein: 4.4 g/dL — ABNORMAL LOW (ref 6.5–8.1)

## 2021-09-20 MED ORDER — DOCUSATE SODIUM 100 MG PO CAPS: 100.0000 mg | ORAL_CAPSULE | Freq: Two times a day (BID) | ORAL | 0 refills | Status: AC

## 2021-09-20 MED ORDER — HYDROCODONE-ACETAMINOPHEN 10-325 MG/15ML PO SOLN: 10.0000 mg | Freq: Four times a day (QID) | ORAL | 0 refills | Status: DC | PRN

## 2021-09-20 MED ORDER — MOXIFLOXACIN HCL 400 MG PO TABS: 400.0000 mg | ORAL_TABLET | Freq: Every day | ORAL | 0 refills | Status: AC

## 2021-09-20 MED ORDER — NAPROXEN SODIUM 550 MG PO TABS: 550.0000 mg | ORAL_TABLET | Freq: Two times a day (BID) | ORAL | 0 refills | Status: AC

## 2021-09-20 MED ORDER — HYDROCODONE-ACETAMINOPHEN 10-325 MG PO TABS
1.0000 | ORAL_TABLET | Freq: Four times a day (QID) | ORAL | 0 refills | Status: AC | PRN
Start: 2021-09-20 — End: 2021-09-25

## 2021-09-20 MED ORDER — METHYLPREDNISOLONE 4 MG PO TBPK: ORAL_TABLET | ORAL | 0 refills | Status: DC

## 2021-09-20 NOTE — Discharge Summary (Signed)
Patient ID: Courtney Buckley MRN: [id number] DOB/AGE: 1951-07-15 70 y.o.  Admit date: 09/18/2021 Discharge date: 09/20/2021  Supervising Physician: Corrie Mckusick  Patient Status: [facility] - In-pt  Admission Diagnoses: Splenic aneurysm s/p splenic aneurysm embolization.   Discharge Diagnoses:  Principal Problem:   Splenic artery aneurysm [facility])   Discharged Condition: stable  Hospital Course: 70 y.o. female inpatient. History of JAK 2 mutation, myleofibrosis, splenomegaly, cirrhosis, esophageal varices, chronic portal vein thrombosis with cavernous transformation and marked splenorenal portosystem collateralization. 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 Courtney Buckley was deemed not to be a good surgical candidate for a splenectomy. The patient was seen for consultation in the Interventional Radiology Clinic on 11.8.56 with Dr. Shellia Cleverly The patient was given several different treatment options and the patient decided to pursue catheter embolization of splenic artery aneurysm. On 11.17.22 Patient had a successful splenic angiogram with embolization of the distal artery aneurysm with a devascaularization of approximately 2.3 of the splenic volume.   Patient was admitted overnight for observation. She was prescribed dilaudid 1 mg IV and heating pad for breakthrough pain overnight. Last dose given on 20:22 on 11.18.22. She is awake alert  laying in bed. Complains of lower abdominal pain midline. She is concerned that any straining with bowel movements may result in splenic pain.  Due to her concern Colace added to her discharge medications. Pain is rated 4/10. Right femoral puncture site is stable. Distal pulses intact. Denies nausea and vomiting. Patient is tolerating PO intake and ADL's without complaint. She is urinating.  WBC 24 down-trending from 31.1, Hgb 8.6. Afebrile. Plan to discharge home today with follow up with Dr. Laurence Ferrari in the [facility]  cl[facility] in 2-3 weeks.  IR contact information given patient was encouraged to called with questions and concerns. Return precautions reviewed. Patient verbalized understanding and is in agreement with plan of care.  Discharge medication regimen (these medications have been e-prescribed to your pharmacy): 1 - Moxifloxacin 500 mg  daily X 12 days 2 - Naproxen 550 mg BID X 5 days  3 - Steroid dose pack 4 - Hydrocodone acetaminophen 10-325 q 6h for severe to moderate pain. 5 - Colace 100 mg BID.  Consults: None  Significant Diagnostic Studies: CT Abdomen Pelvis W Contrast  Result Date: 08/26/2021 CLINICAL DATA:  Abdominal pain. EXAM: CT ABDOMEN AND PELVIS WITH CONTRAST TECHNIQUE: Multidetector CT imaging of the abdomen and pelvis was performed using the standard protocol following bolus administration of intravenous contrast. CONTRAST:  93mL OMNIPAQUE IOHEXOL 350 MG/ML SOLN COMPARISON:  07/02/2021 FINDINGS: Lower chest: Unremarkable. Hepatobiliary: 13 mm hypoattenuating lesion in the posterior right liver is similar to prior and was characterized as hemangioma on MRI 10/03/2020. No arterial phase hyperenhancement within the liver parenchyma. Multiple gallstones again noted. No intrahepatic or extrahepatic biliary dilation. Pancreas: No focal mass lesion. No dilatation of the main duct. No intraparenchymal cyst. No peripancreatic edema. Spleen: Marked splenomegaly with extensive venous collateralization in the left abdomen. Adrenals/Urinary Tract: No adrenal nodule or mass. Kidneys unremarkable. No evidence for hydroureter. The urinary bladder appears normal for the degree of distention. Stomach/Bowel: Stomach is unremarkable. No gastric wall thickening. No evidence of outlet obstruction. Duodenum is normally positioned as is the ligament of Treitz. No small bowel wall thickening. No small bowel dilatation. The terminal ileum is normal. The appendix is not well visualized, but there is no edema or  inflammation in the region of the cecum. No gross colonic mass. No colonic  wall thickening. Vascular/Lymphatic: No abdominal aortic aneurysm. Chronic portal vein occlusion again noted with extensive venous collateralization in the left abdomen suggesting spleno renal shunt. 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 There is no gastrohepatic or hepatoduodenal ligament lymphadenopathy. No retroperitoneal or mesenteric lymphadenopathy. No pelvic sidewall lymphadenopathy. Reproductive: Mass lesion in the uterine fundus is likely a fibroid. There is no adnexal mass. Other: Small volume free fluid in the cul-de-sac. Stable appearance of mesenteric congestion and trace fluid in the right paracolic gutter. Musculoskeletal: No worrisome lytic or sclerotic osseous abnormality. IMPRESSION: 1. No acute findings in the abdomen or pelvis. Specifically, no findings to explain the patient's history of abdominal pain. 2. 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 Interventional radiology consultation recommended. 3. Chronic portal vein occlusion with extensive venous collateralization in the left abdomen suggesting spleno renal shunt. 4. Cirrhosis. 5. Cholelithiasis. 6. Small volume free fluid in the cul-de-sac. 7. Uterine fibroid. 8. Stable 13 mm hypoattenuating lesion in the posterior right liver was characterized as hemangioma on MRI 10/03/2020. Electronically Signed   By: Misty Stanley M.D.   On: 08/26/2021 15:46   IR Angiogram Visceral Selective  Result Date: 09/18/2021 INDICATION: 70 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 She presents for splenic angiogram and embolization of the aneurysm. EXAM: IR EMBO ARTERIAL NOT HEMORR HEMANG INC GUIDE ROADMAPPING; ADDITIONAL  ARTERIOGRAPHY; IR ULTRASOUND GUIDANCE VASC ACCESS RIGHT; SELECTIVE VISCERAL ARTERIOGRAPHY MEDICATIONS: 500 mL Levaquin. Additionally, 10 mg Decadron and 30 mg Toradol were administered during the procedure. The antibiotic was administered within 1 hour of the procedure ANESTHESIA/SEDATION: General anesthesia. CONTRAST:  66mL OMNIPAQUE IOHEXOL 300 MG/ML SOLN, 26mL OMNIPAQUE IOHEXOL 300 MG/ML SOLN FLUOROSCOPY TIME:  Fluoroscopy Time: 37 minutes 6 seconds (4187 mGy). COMPLICATIONS: None immediate. PROCEDURE: Informed consent was obtained from the patient following explanation of the procedure, risks, benefits and alternatives. The patient understands, agrees and consents for the procedure. All questions were addressed. A time out was performed prior to the initiation of the procedure. Maximal barrier sterile technique utilized including caps, mask, sterile gowns, sterile gloves, large sterile drape, hand hygiene, and Betadine prep. The right common femoral artery was interrogated with ultrasound and found to be widely patent. An image was obtained and stored for the medical record. Local anesthesia was attained by infiltration with 1% lidocaine. A small dermatotomy was made. Under real-time sonographic guidance, the vessel was punctured with a 21 gauge micropuncture needle. Using standard technique, the initial micro needle was exchanged over a 0.018 micro wire for a transitional 4 [place] micro sheath. The micro sheath was then exchanged over a 0.035 wire for a 5 French vascular sheath. A C2 cobra catheter was used to select the celiac artery. A celiac arteriogram was performed. Conventional celiac axis anatomy. The approximately 3.2 cm aneurysm of the distal splenic artery was easily identified. A glidewire was advanced more distally into the splenic artery. The C2 cobra catheter was advanced into the mid aspect of the splenic artery. The glidewire was exchanged for a Rosen wire. The 5 [place] vascular sheath was  exchanged for a 6 French 45 cm to rubor destination sheath. This was advanced into the proximal splenic artery. The Cobra catheter was then exchanged for a 5 French glide catheter which was then navigated even more  distally into the splenic artery. One is much distal progression as possible could be made, a lantern microcatheter was introduced over a Fathom 16 wire in used to navigate into the aneurysm sac, and ultimately through the aneurysm sac into the outflow vessels. As seen on prior CTA imaging, the outflow vessel bifurcates after a short distance in provide supply to both the mid and inferior aspect of the spleen. A more proximal a branch arising from the mid splenic artery provides flow to the upper pole of the spleen. Once the microcatheter was successfully navigated into the lower pole branch. Coil embolization of the outflow arteries was performed using a series of penumbra detachable microcoils. Once the outflow arteries had been embolized into the aneurysm sac, the aneurysm sac itself was stabilized by placement of two 36 mm and two 32 mm penumbra framing coils. Once the aneurysm was successfully framed, a 14 mm fibered interlock detachable coil was partially deployed on the back bench and soaked in thrombin solution. The catheter was then re- sheathed and successfully deployed into the center of the framed aneurysm. The inflow artery was then coil embolized using a series of penumbra coils terminating with a 45 cm liquid metal coil. Contrast injection performed at this time confirms no antegrade flow into the aneurysm sac and only retrograde flow in the splenic artery. Therefore, the microcatheter was removed. Injections were performed through the 5 French catheter in the more proximal splenic artery as well as through the 6 French sheath in the origin of the celiac artery. The proximal and mid splenic artery remain patent. The proximal branch to the upper pole is patent with excellent contrast blush  of the upper pole of the spleen. Unfortunately, there is no significant reconstitution of more distal splenic branches resulting in absent parenchymal blush in the mid and distal spleen. 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 IMPRESSION: 1. Successful coil embolization of distal splenic artery aneurysm with no evidence of residual flow into the aneurysm sac. 2. This results in devascularization of approximately 2/3 of the splenic volume, predominantly in the mid and lower pole. 3. Persistent parenchymal opacification of the upper pole of the spleen due to a more proximal upper pole branch. PLAN: 1. 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 2. We will manage her aggressively with steroids, NSAIDs and normal caudate pain medications as needed. 3. Monitor signs and symptoms of post embolization syndrome. 4. Intravenous Levaquin while an inpatient. Upon discharge patient will complete a 2 week course of moxifloxacin 500 mg daily Electronically Signed   By: Jacqulynn Cadet M.D.   On: 09/18/2021 19:41   IR Angiogram Selective Each Additional Vessel  Result Date: 09/18/2021 INDICATION: 70 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 She presents for splenic angiogram and embolization of the aneurysm. EXAM: IR EMBO ARTERIAL NOT HEMORR HEMANG INC GUIDE ROADMAPPING; ADDITIONAL ARTERIOGRAPHY; IR ULTRASOUND GUIDANCE VASC ACCESS RIGHT; SELECTIVE VISCERAL ARTERIOGRAPHY MEDICATIONS: 500 mL Levaquin. Additionally, 10 mg Decadron and 30 mg Toradol were administered during the procedure. The antibiotic was administered within 1 hour of the procedure ANESTHESIA/SEDATION: General anesthesia. CONTRAST:  85mL OMNIPAQUE IOHEXOL 300 MG/ML SOLN, 68mL OMNIPAQUE IOHEXOL 300 MG/ML SOLN FLUOROSCOPY TIME:  Fluoroscopy Time: 37 minutes 6 seconds (4187 mGy). COMPLICATIONS: None  immediate. PROCEDURE: Informed consent was obtained from the patient following explanation of the procedure, risks, benefits and alternatives. The patient understands, agrees  and consents for the procedure. All questions were addressed. A time out was performed prior to the initiation of the procedure. Maximal barrier sterile technique utilized including caps, mask, sterile gowns, sterile gloves, large sterile drape, hand hygiene, and Betadine prep. The right common femoral artery was interrogated with ultrasound and found to be widely patent. An image was obtained and stored for the medical record. Local anesthesia was attained by infiltration with 1% lidocaine. A small dermatotomy was made. Under real-time sonographic guidance, the vessel was punctured with a 21 gauge micropuncture needle. Using standard technique, the initial micro needle was exchanged over a 0.018 micro wire for a transitional 4 [place] micro sheath. The micro sheath was then exchanged over a 0.035 wire for a 5 French vascular sheath. A C2 cobra catheter was used to select the celiac artery. A celiac arteriogram was performed. Conventional celiac axis anatomy. The approximately 3.2 cm aneurysm of the distal splenic artery was easily identified. A glidewire was advanced more distally into the splenic artery. The C2 cobra catheter was advanced into the mid aspect of the splenic artery. The glidewire was exchanged for a Rosen wire. The 5 [place] vascular sheath was exchanged for a 6 French 45 cm to rubor destination sheath. This was advanced into the proximal splenic artery. The Cobra catheter was then exchanged for a 5 French glide catheter which was then navigated even more distally into the splenic artery. One is much distal progression as possible could be made, a lantern microcatheter was introduced over a Fathom 16 wire in used to navigate into the aneurysm sac, and ultimately through the aneurysm sac into the outflow vessels. As seen on prior  CTA imaging, the outflow vessel bifurcates after a short distance in provide supply to both the mid and inferior aspect of the spleen. A more proximal a branch arising from the mid splenic artery provides flow to the upper pole of the spleen. Once the microcatheter was successfully navigated into the lower pole branch. Coil embolization of the outflow arteries was performed using a series of penumbra detachable microcoils. Once the outflow arteries had been embolized into the aneurysm sac, the aneurysm sac itself was stabilized by placement of two 36 mm and two 32 mm penumbra framing coils. Once the aneurysm was successfully framed, a 14 mm fibered interlock detachable coil was partially deployed on the back bench and soaked in thrombin solution. The catheter was then re- sheathed and successfully deployed into the center of the framed aneurysm. The inflow artery was then coil embolized using a series of penumbra coils terminating with a 45 cm liquid metal coil. Contrast injection performed at this time confirms no antegrade flow into the aneurysm sac and only retrograde flow in the splenic artery. Therefore, the microcatheter was removed. Injections were performed through the 5 French catheter in the more proximal splenic artery as well as through the 6 French sheath in the origin of the celiac artery. The proximal and mid splenic artery remain patent. The proximal branch to the upper pole is patent with excellent contrast blush of the upper pole of the spleen. Unfortunately, there is no significant reconstitution of more distal splenic branches resulting in absent parenchymal blush in the mid and distal spleen. 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 IMPRESSION: 1. Successful coil embolization of distal splenic artery aneurysm with no evidence of residual flow into the aneurysm sac. 2. This results in devascularization of approximately 2/3 of the splenic volume, predominantly in the mid  and lower pole. 3. Persistent parenchymal opacification of the upper pole of the spleen due to a more proximal upper pole branch. PLAN: 1. 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 2. We will manage her aggressively with steroids, NSAIDs and normal caudate pain medications as needed. 3. Monitor signs and symptoms of post embolization syndrome. 4. Intravenous Levaquin while an inpatient. Upon discharge patient will complete a 2 week course of moxifloxacin 500 mg daily Electronically Signed   By: Jacqulynn Cadet M.D.   On: 09/18/2021 19:41   US ARTERIAL ABI (SCREENING LOWER EXTREMITY)  Result Date: 09/16/2021 CLINICAL DATA:  Family history of popliteal artery aneurysms EXAM: NONINVASIVE PHYSIOLOGIC VASCULAR STUDY OF BILATERAL LOWER EXTREMITIES TECHNIQUE: Evaluation of both lower extremities were performed at rest, including calculation of ankle-brachial indices with single level Doppler, pressure and pulse volume recording. COMPARISON:  None. FINDINGS: Right ABI:  1.2 Left ABI:  1.2 Right Lower Extremity:  Normal arterial waveforms at the ankle. Left Lower Extremity:  Normal arterial waveforms at the ankle. 1.0-1.4 Normal IMPRESSION: Normal bilateral resting ankle-brachial indices. Electronically Signed   By: Jacqulynn Cadet M.D.   On: 09/16/2021 11:50   US ARTERIAL LOWER EXTREMITY DUPLEX BILATERAL  Result Date: 09/16/2021 CLINICAL DATA:  Splenic artery aneurysm with significant family history of intracranial and popliteal aneurysms. Assess for popliteal aneurysmal disease. EXAM: BILATERAL LOWER EXTREMITY ARTERIAL DUPLEX SCAN TECHNIQUE: Gray-scale sonography as well as color Doppler and duplex ultrasound was performed to evaluate the arteries of both lower extremities including the common, superficial and profunda femoral arteries, popliteal artery and calf arteries. COMPARISON:  None. FINDINGS: Right Lower  Extremity ABI: 1.2 Inflow: Normal common femoral arterial waveforms and velocities. No evidence of inflow (aortoiliac) disease. Outflow: Normal profunda femoral, superficial femoral and popliteal arterial waveforms and velocities. No focal elevation of the PSV to suggest stenosis. Runoff: Normal posterior and anterior tibial arterial waveforms and velocities. Vessels are patent to the ankle. Left Lower Extremity ABI: 1.2 Inflow: Normal common femoral arterial waveforms and velocities. No evidence of inflow (aortoiliac) disease. Outflow: Normal profunda femoral, superficial femoral and popliteal arterial waveforms and velocities. No focal elevation of the PSV to suggest stenosis. Runoff: Normal posterior and anterior tibial arterial waveforms and velocities. Vessels are patent to the ankle. IMPRESSION: 1. Negative for evidence of popliteal artery aneurysm. 2. Trace atherosclerotic plaque without stenosis or occlusion. 3. Normal bilateral resting ankle-brachial indices. Signed, Criselda Peaches, MD, [facility] Electronically Signed   By: Jacqulynn Cadet M.D.   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 She presents for splenic angiogram and embolization of the aneurysm. EXAM: IR EMBO ARTERIAL NOT HEMORR HEMANG INC GUIDE ROADMAPPING; ADDITIONAL ARTERIOGRAPHY; IR ULTRASOUND GUIDANCE VASC ACCESS RIGHT; SELECTIVE VISCERAL ARTERIOGRAPHY MEDICATIONS: 500 mL Levaquin. Additionally, 10 mg Decadron and 30 mg Toradol were administered during the procedure. The antibiotic was administered within 1 hour of the procedure ANESTHESIA/SEDATION: General anesthesia. CONTRAST:  88mL OMNIPAQUE IOHEXOL 300 MG/ML SOLN, 64mL OMNIPAQUE IOHEXOL 300 MG/ML SOLN FLUOROSCOPY TIME:  Fluoroscopy Time: 37 minutes 6 seconds (4187 mGy).  COMPLICATIONS: None immediate. PROCEDURE: Informed consent was obtained from the patient following explanation of the procedure, risks, benefits and alternatives. The patient understands, agrees and consents for the procedure. All questions were addressed. A time out  was performed prior to the initiation of the procedure. Maximal barrier sterile technique utilized including caps, mask, sterile gowns, sterile gloves, large sterile drape, hand hygiene, and Betadine prep. The right common femoral artery was interrogated with ultrasound and found to be widely patent. An image was obtained and stored for the medical record. Local anesthesia was attained by infiltration with 1% lidocaine. A small dermatotomy was made. Under real-time sonographic guidance, the vessel was punctured with a 21 gauge micropuncture needle. Using standard technique, the initial micro needle was exchanged over a 0.018 micro wire for a transitional 4 [place] micro sheath. The micro sheath was then exchanged over a 0.035 wire for a 5 French vascular sheath. A C2 cobra catheter was used to select the celiac artery. A celiac arteriogram was performed. Conventional celiac axis anatomy. The approximately 3.2 cm aneurysm of the distal splenic artery was easily identified. A glidewire was advanced more distally into the splenic artery. The C2 cobra catheter was advanced into the mid aspect of the splenic artery. The glidewire was exchanged for a Rosen wire. The 5 [place] vascular sheath was exchanged for a 6 French 45 cm to rubor destination sheath. This was advanced into the proximal splenic artery. The Cobra catheter was then exchanged for a 5 French glide catheter which was then navigated even more distally into the splenic artery. One is much distal progression as possible could be made, a lantern microcatheter was introduced over a Fathom 16 wire in used to navigate into the aneurysm sac, and ultimately through the aneurysm sac into the outflow  vessels. As seen on prior CTA imaging, the outflow vessel bifurcates after a short distance in provide supply to both the mid and inferior aspect of the spleen. A more proximal a branch arising from the mid splenic artery provides flow to the upper pole of the spleen. Once the microcatheter was successfully navigated into the lower pole branch. Coil embolization of the outflow arteries was performed using a series of penumbra detachable microcoils. Once the outflow arteries had been embolized into the aneurysm sac, the aneurysm sac itself was stabilized by placement of two 36 mm and two 32 mm penumbra framing coils. Once the aneurysm was successfully framed, a 14 mm fibered interlock detachable coil was partially deployed on the back bench and soaked in thrombin solution. The catheter was then re- sheathed and successfully deployed into the center of the framed aneurysm. The inflow artery was then coil embolized using a series of penumbra coils terminating with a 45 cm liquid metal coil. Contrast injection performed at this time confirms no antegrade flow into the aneurysm sac and only retrograde flow in the splenic artery. Therefore, the microcatheter was removed. Injections were performed through the 5 French catheter in the more proximal splenic artery as well as through the 6 French sheath in the origin of the celiac artery. The proximal and mid splenic artery remain patent. The proximal branch to the upper pole is patent with excellent contrast blush of the upper pole of the spleen. Unfortunately, there is no significant reconstitution of more distal splenic branches resulting in absent parenchymal blush in the mid and distal spleen. 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 IMPRESSION: 1. Successful coil embolization of distal splenic artery aneurysm with no evidence of residual flow into the aneurysm sac. 2. This results in devascularization of approximately 2/3 of the splenic volume,  predominantly in the mid and lower pole. 3. Persistent parenchymal opacification of the upper pole of  the spleen due to a more proximal upper pole branch. PLAN: 1. 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 2. We will manage her aggressively with steroids, NSAIDs and normal caudate pain medications as needed. 3. Monitor signs and symptoms of post embolization syndrome. 4. Intravenous Levaquin while an inpatient. Upon discharge patient will complete a 2 week course of moxifloxacin 500 mg daily Electronically Signed   By: Jacqulynn Cadet M.D.   On: 09/18/2021 19:41   ECHOCARDIOGRAM COMPLETE  Result Date: 09/02/2021    ECHOCARDIOGRAM REPORT   Patient Name:   Courtney Buckley Date of Exam: 09/02/2021 Medical Rec #:  [id number]           Height:       66.0 in Accession #:    [id number]          Weight:       181.0 lb Date of Birth:  [birth year]           BSA:          1.917 m Patient Age:    45 years            BP:           102/57 mmHg Patient Gender: F                   HR:           82 bpm. Exam Location:  [facility] Procedure: 2D Echo, 3D Echo, Cardiac Doppler, Color Doppler and Strain Analysis Indications:    I50.31 CHF  History:        Patient has prior history of Echocardiogram examinations, most                 recent 01/23/2021. CHF, Signs/Symptoms:Edema; Risk Factors:Former                 Smoker. Bone Marrow Cancer.  Sonographer:    Deliah Boston RDCS Referring Phys: Randa Evens IOEVOJJK IMPRESSIONS  1. Left ventricular ejection fraction, by estimation, is 65 to 70%. The left ventricle has normal function. The left ventricle has no regional wall motion abnormalities. Left ventricular diastolic parameters are consistent with Grade II diastolic dysfunction (pseudonormalization). The average left ventricular global longitudinal strain is 25.7 %. The global longitudinal strain is normal.  2. Right ventricular  systolic function is normal. The right ventricular size is normal. There is moderately elevated pulmonary artery systolic pressure. The estimated right ventricular systolic pressure is 09.3 mmHg.  3. The mitral valve is normal in structure. Trivial mitral valve regurgitation. No evidence of mitral stenosis, the mean gradient is elevated but the valve opens well -- possible elevation in gradient due to high flow. The mean mitral valve gradient is 6.0 mmHg. Moderate mitral annular calcification.  4. The aortic valve is tricuspid. Aortic valve regurgitation is not visualized. No aortic stenosis is present, the mean gradient is elevated but the valve opens well -- possible elevation in gradient due to high flow (same as mitral valve). Aortic valve  area, by VTI measures 3.23 cm. Aortic valve mean gradient measures 17.0 mmHg.  5. Aortic dilatation noted. There is mild dilatation of the ascending aorta, measuring 42 mm.  6. Left atrial size was severely dilated.  7. The inferior vena cava is dilated in size with <50% respiratory variability, suggesting right atrial pressure of 15 mmHg.  8. No definite ASD was visualized. FINDINGS  Left Ventricle: Left ventricular ejection fraction, by estimation, is 65 to 70%. The left ventricle has normal function. The left ventricle has no regional wall motion abnormalities. The average left ventricular global longitudinal strain is 25.7 %. The  global longitudinal strain is normal. The left ventricular internal cavity size was normal in size. There is no left ventricular hypertrophy. Left ventricular diastolic parameters are consistent with Grade II diastolic dysfunction (pseudonormalization). Right Ventricle: The right ventricular size is normal. No increase in right ventricular wall thickness. Right ventricular systolic function is normal. There is moderately elevated pulmonary artery systolic pressure. The tricuspid regurgitant velocity is 2.92 m/s, and with an assumed right atrial  pressure of 15 mmHg, the estimated right ventricular systolic pressure is 38.8 mmHg. Left Atrium: Left atrial size was severely dilated. Right Atrium: Right atrial size was normal in size. Pericardium: There is no evidence of pericardial effusion. Mitral Valve: The mitral valve is normal in structure. Moderate mitral annular calcification. Trivial mitral valve regurgitation. No evidence of mitral valve stenosis. MV peak gradient, 14.7 mmHg. The mean mitral valve gradient is 6.0 mmHg. Tricuspid Valve: The tricuspid valve is normal in structure. Tricuspid valve regurgitation is trivial. Aortic Valve: The aortic valve is tricuspid. Aortic valve regurgitation is not visualized. No aortic stenosis is present. Aortic valve mean gradient measures 17.0 mmHg. Aortic valve peak gradient measures 27.8 mmHg. Aortic valve area, by VTI measures 3.23 cm. Pulmonic Valve: The pulmonic valve was normal in structure. Pulmonic valve regurgitation is trivial. Aorta: Aortic dilatation noted. There is mild dilatation of the ascending aorta, measuring 42 mm. Venous: The inferior vena cava is dilated in size with less than 50% respiratory variability, suggesting right atrial pressure of 15 mmHg. IAS/Shunts: No definite ASD was visualized.  LEFT VENTRICLE PLAX 2D LVIDd:         5.20 cm   Diastology LVIDs:         3.10 cm   LV e' medial:    11.40 cm/s LV PW:         0.90 cm   LV E/e' medial:  13.3 LV IVS:        1.10 cm   LV e' lateral:   15.70 cm/s LVOT diam:     2.40 cm   LV E/e' lateral: 9.7 LV SV:         177 LV SV Index:   92        2D Longitudinal Strain LVOT Area:     4.52 cm  2D Strain GLS (A2C):   26.2 %                          2D Strain GLS (A3C):   25.9 %                          2D Strain GLS (A4C):   24.9 %                          2D Strain GLS Avg:     25.7 %                           3D Volume EF:                          3D EF:  66 %                          LV EDV:       203 ml                          LV ESV:        69 ml                          LV SV:        134 ml RIGHT VENTRICLE RV S prime:     19.20 cm/s RVOT diam:      2.90 cm TAPSE (M-mode): 3.7 cm LEFT ATRIUM              Index        RIGHT ATRIUM           Index LA diam:        5.00 cm  2.61 cm/m   RA Area:     15.80 cm LA Vol (A2C):   143.0 ml 74.61 ml/m  RA Volume:   42.10 ml  21.96 ml/m LA Vol (A4C):   153.0 ml 79.82 ml/m LA Biplane Vol: 154.0 ml 80.34 ml/m  AORTIC VALVE AV Area (Vmax):    3.33 cm AV Area (Vmean):   3.25 cm AV Area (VTI):     3.23 cm AV Vmax:           263.80 cm/s AV Vmean:          181.800 cm/s AV VTI:            0.547 m AV Peak Grad:      27.8 mmHg AV Mean Grad:      17.0 mmHg LVOT Vmax:         194.00 cm/s LVOT Vmean:        130.500 cm/s LVOT VTI:          0.390 m LVOT/AV VTI ratio: 0.71  AORTA Ao Root diam: 3.50 cm Ao Asc diam:  4.15 cm MITRAL VALVE                TRICUSPID VALVE MV Area (PHT): cm          TR Peak grad:   34.1 mmHg MV Area VTI:   2.48 cm     TR Vmax:        292.00 cm/s MV Peak grad:  14.7 mmHg MV Mean grad:  6.0 mmHg     SHUNTS MV Vmax:       1.92 m/s     Systemic VTI:  0.39 m MV Vmean:      107.0 cm/s   Systemic Diam: 2.40 cm MV Decel Time: 247 msec     Pulmonic Diam: 2.90 cm MV E velocity: 151.75 cm/s MV A velocity: 134.50 cm/s MV E/A ratio:  1.13 Dalton McleanMD Electronically signed by Franki Monte Signature Date/Time: 09/02/2021/6:22:17 PM    Final    IR EMBO ARTERIAL NOT HEMORR HEMANG INC GUIDE ROADMAPPING  Result Date: 09/18/2021 INDICATION: 70 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 She presents for splenic angiogram and embolization of the aneurysm. EXAM: IR EMBO ARTERIAL NOT HEMORR HEMANG INC GUIDE ROADMAPPING; ADDITIONAL ARTERIOGRAPHY; IR ULTRASOUND GUIDANCE VASC ACCESS RIGHT; SELECTIVE VISCERAL ARTERIOGRAPHY MEDICATIONS: 500 mL Levaquin. Additionally, 10 mg Decadron and 30 mg Toradol were administered during  the procedure. The antibiotic was  administered within 1 hour of the procedure ANESTHESIA/SEDATION: General anesthesia. CONTRAST:  79mL OMNIPAQUE IOHEXOL 300 MG/ML SOLN, 60mL OMNIPAQUE IOHEXOL 300 MG/ML SOLN FLUOROSCOPY TIME:  Fluoroscopy Time: 37 minutes 6 seconds (4187 mGy). COMPLICATIONS: None immediate. PROCEDURE: Informed consent was obtained from the patient following explanation of the procedure, risks, benefits and alternatives. The patient understands, agrees and consents for the procedure. All questions were addressed. A time out was performed prior to the initiation of the procedure. Maximal barrier sterile technique utilized including caps, mask, sterile gowns, sterile gloves, large sterile drape, hand hygiene, and Betadine prep. The right common femoral artery was interrogated with ultrasound and found to be widely patent. An image was obtained and stored for the medical record. Local anesthesia was attained by infiltration with 1% lidocaine. A small dermatotomy was made. Under real-time sonographic guidance, the vessel was punctured with a 21 gauge micropuncture needle. Using standard technique, the initial micro needle was exchanged over a 0.018 micro wire for a transitional 4 [place] micro sheath. The micro sheath was then exchanged over a 0.035 wire for a 5 French vascular sheath. A C2 cobra catheter was used to select the celiac artery. A celiac arteriogram was performed. Conventional celiac axis anatomy. The approximately 3.2 cm aneurysm of the distal splenic artery was easily identified. A glidewire was advanced more distally into the splenic artery. The C2 cobra catheter was advanced into the mid aspect of the splenic artery. The glidewire was exchanged for a Rosen wire. The 5 [place] vascular sheath was exchanged for a 6 French 45 cm to rubor destination sheath. This was advanced into the proximal splenic artery. The Cobra catheter was then exchanged for a 5 French glide catheter which was then navigated even more distally into  the splenic artery. One is much distal progression as possible could be made, a lantern microcatheter was introduced over a Fathom 16 wire in used to navigate into the aneurysm sac, and ultimately through the aneurysm sac into the outflow vessels. As seen on prior CTA imaging, the outflow vessel bifurcates after a short distance in provide supply to both the mid and inferior aspect of the spleen. A more proximal a branch arising from the mid splenic artery provides flow to the upper pole of the spleen. Once the microcatheter was successfully navigated into the lower pole branch. Coil embolization of the outflow arteries was performed using a series of penumbra detachable microcoils. Once the outflow arteries had been embolized into the aneurysm sac, the aneurysm sac itself was stabilized by placement of two 36 mm and two 32 mm penumbra framing coils. Once the aneurysm was successfully framed, a 14 mm fibered interlock detachable coil was partially deployed on the back bench and soaked in thrombin solution. The catheter was then re- sheathed and successfully deployed into the center of the framed aneurysm. The inflow artery was then coil embolized using a series of penumbra coils terminating with a 45 cm liquid metal coil. Contrast injection performed at this time confirms no antegrade flow into the aneurysm sac and only retrograde flow in the splenic artery. Therefore, the microcatheter was removed. Injections were performed through the 5 French catheter in the more proximal splenic artery as well as through the 6 French sheath in the origin of the celiac artery. The proximal and mid splenic artery remain patent. The proximal branch to the upper pole is patent with excellent contrast blush of the upper pole of the spleen. Unfortunately, there is  no significant reconstitution of more distal splenic branches resulting in absent parenchymal blush in the mid and distal spleen. 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 IMPRESSION: 1. Successful coil embolization of distal splenic artery aneurysm with no evidence of residual flow into the aneurysm sac. 2. This results in devascularization of approximately 2/3 of the splenic volume, predominantly in the mid and lower pole. 3. Persistent parenchymal opacification of the upper pole of the spleen due to a more proximal upper pole branch. PLAN: 1. 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 2. We will manage her aggressively with steroids, NSAIDs and normal caudate pain medications as needed. 3. Monitor signs and symptoms of post embolization syndrome. 4. Intravenous Levaquin while an inpatient. Upon discharge patient will complete a 2 week course of moxifloxacin 500 mg daily Electronically Signed   By: Jacqulynn Cadet M.D.   On: 09/18/2021 19:41   MR ANGIO HEAD WO W CONTRAST  Result Date: 09/15/2021 CLINICAL DATA:  Visceral aneurysm, rule out cerebral aneurysm. EXAM: MRA HEAD WITHOUT AND WITH CONTRAST TECHNIQUE: Angiographic images of the Circle of Willis were obtained using MRA technique with and without intravenous contrast. On a professional basis, the patient should only be billed for a noncontrast intracranial MRA. CONTRAST:  7.61mL GADAVIST GADOBUTROL 1 MMOL/ML IV SOLN COMPARISON:  None. FINDINGS: ICA tortuosity at the skull base.  No beading or dissection. Negative for intracranial aneurysm, beading, or vascular malformation. No branch occlusion or stenosis. Postcontrast images of the arterial system were acquired and are noncontributory. Neck MRA images were inadvertently acquired and are negative. IMPRESSION: 1. Negative for cerebral aneurysm. 2. Inadvertent neck MRA which is also negative Electronically Signed   By: Jorje Guild M.D.   On: 09/15/2021 11:26   IR Radiologist Eval & Mgmt  Result Date: 09/09/2021 Please refer to notes tab for details  about interventional procedure. (Op Note)   Treatments: analgesia: Norco and Naproxen and steroids: prednisone  Discharge Exam: Blood pressure (!) 108/52, pulse 78, temperature 97.8 F (36.6 C), temperature source Oral, resp. rate 17, height 5\' 6"  (1.676 m), weight 180 lb (81.6 kg), SpO2 96 %. Physical Exam Vitals and nursing note reviewed.  Constitutional:      Appearance: She is well-developed.  HENT:     Head: Normocephalic and atraumatic.  Eyes:     Conjunctiva/sclera: Conjunctivae normal.  Cardiovascular:     Rate and Rhythm: Normal rate and regular rhythm.     Heart sounds: Normal heart sounds.     Comments: Site is soft with no active bleeding and no appreciable pseudoaneurysm. Dressing is C/D/I  Pulmonary:     Effort: Pulmonary effort is normal.     Breath sounds: Normal breath sounds.  Musculoskeletal:     Cervical back: Normal range of motion.  Skin:    General: Skin is warm and dry.  Neurological:     Mental Status: She is alert and oriented to person, place, and time.    Disposition:    Allergies as of 09/20/2021   No Known Allergies      Medication List     STOP taking these medications    rifaximin 550 MG Tabs tablet Commonly known as: XIFAXAN       TAKE these medications    docusate sodium 100 MG capsule Commonly known as: Colace Take 1 capsule (100 mg total) by mouth 2 (two) times daily for 5  days.   furosemide 20 MG tablet Commonly known as: LASIX Take 1 tablet (20 mg total) by mouth 2 (two) times daily. What changed:  when to take this reasons to take this   HYDROcodone-acetaminophen 10-325 MG tablet Commonly known as: Norco Take 1 tablet by mouth every 6 (six) hours as needed for up to 5 days. What changed: reasons to take this   Jakafi 10 MG tablet Generic drug: ruxolitinib phosphate Take 10 mg by mouth 2 (two) times daily.   lactulose 10 GM/15ML solution Commonly known as: CHRONULAC Take 45 ml daily   methylPREDNISolone  4 MG Tbpk tablet Commonly known as: MEDROL DOSEPAK 6 day taper dose.   moxifloxacin 400 MG tablet Commonly known as: AVELOX Take 1 tablet (400 mg total) by mouth daily at 8 pm for 12 days.   naproxen sodium 550 MG tablet Commonly known as: Anaprox DS Take 1 tablet (550 mg total) by mouth 2 (two) times daily with a meal for 5 days.   promethazine 12.5 MG tablet Commonly known as: PHENERGAN Take 12.5 mg by mouth every 6 (six) hours as needed for nausea or vomiting.        Follow-up Information     Criselda Peaches, MD Follow up.   Specialties: Interventional Radiology, Radiology Why: Fllow up in [facility] in 2-3 weeks. Contact information: [address] [place] [address] [phone]                  Electronically Signed: Jacqualine Mau, NP 09/20/2021, 11:49 AM   I have spent Greater Than 30 Minutes discharging Courtney Buckley.

## 2021-09-20 NOTE — Plan of Care (Signed)

## 2021-09-20 NOTE — Discharge Instructions (Addendum)
Stay well hydrated. Ok to shower 48 hours post-procedure. Recommend showering with bandage on, remove bandage immediately after showering and pat area dry. No further dressing changes needed after this- ensure area remains clean and dry until fully healed. No submerging (swimming, bathing) for 7 days post-procedure. Plan to follow-up with Dr. Laurence Ferrari in 2-3 weeks after discharge (our office will call you to set up this appointment).

## 2021-09-22 LAB — PATHOLOGIST SMEAR REVIEW

## 2021-09-24 ENCOUNTER — Other Ambulatory Visit: Payer: Self-pay | Admitting: Physician Assistant

## 2021-09-24 MED ORDER — HYDROCODONE-ACETAMINOPHEN 10-325 MG PO TABS: 1.0000 | ORAL_TABLET | Freq: Four times a day (QID) | ORAL | 0 refills | Status: DC | PRN

## 2021-09-28 ENCOUNTER — Other Ambulatory Visit: Payer: Self-pay

## 2021-09-28 ENCOUNTER — Inpatient Hospital Stay (HOSPITAL_COMMUNITY)
Admission: EM | Admit: 2021-09-28 | Discharge: 2021-10-02 | DRG: 378 | Disposition: A | Discharge status: Home / Self Care | Payer: Medicare PPO | Attending: Internal Medicine | Admitting: Internal Medicine

## 2021-09-28 ENCOUNTER — Emergency Department (HOSPITAL_COMMUNITY): Payer: Medicare PPO

## 2021-09-28 DIAGNOSIS — K7469 Other cirrhosis of liver: Secondary | ICD-10-CM | POA: Diagnosis present

## 2021-09-28 DIAGNOSIS — Z801 Family history of malignant neoplasm of trachea, bronchus and lung: Secondary | ICD-10-CM | POA: Diagnosis not present

## 2021-09-28 DIAGNOSIS — D500 Iron deficiency anemia secondary to blood loss (chronic): Secondary | ICD-10-CM | POA: Diagnosis present

## 2021-09-28 DIAGNOSIS — K254 Chronic or unspecified gastric ulcer with hemorrhage: Principal | ICD-10-CM | POA: Diagnosis present

## 2021-09-28 DIAGNOSIS — K921 Melena: Secondary | ICD-10-CM | POA: Diagnosis not present

## 2021-09-28 DIAGNOSIS — K648 Other hemorrhoids: Secondary | ICD-10-CM | POA: Diagnosis present

## 2021-09-28 DIAGNOSIS — D7581 Myelofibrosis: Secondary | ICD-10-CM | POA: Diagnosis present

## 2021-09-28 DIAGNOSIS — K641 Second degree hemorrhoids: Secondary | ICD-10-CM | POA: Diagnosis not present

## 2021-09-28 DIAGNOSIS — Z20822 Contact with and (suspected) exposure to covid-19: Secondary | ICD-10-CM | POA: Diagnosis present

## 2021-09-28 DIAGNOSIS — D62 Acute posthemorrhagic anemia: Secondary | ICD-10-CM

## 2021-09-28 DIAGNOSIS — D72829 Elevated white blood cell count, unspecified: Secondary | ICD-10-CM | POA: Diagnosis not present

## 2021-09-28 DIAGNOSIS — Z87891 Personal history of nicotine dependence: Secondary | ICD-10-CM | POA: Diagnosis not present

## 2021-09-28 DIAGNOSIS — D75839 Thrombocytosis, unspecified: Secondary | ICD-10-CM

## 2021-09-28 DIAGNOSIS — Z800 Family history of malignant neoplasm of digestive organs: Secondary | ICD-10-CM

## 2021-09-28 DIAGNOSIS — R9389 Abnormal findings on diagnostic imaging of other specified body structures: Secondary | ICD-10-CM | POA: Diagnosis not present

## 2021-09-28 DIAGNOSIS — Z79899 Other long term (current) drug therapy: Secondary | ICD-10-CM | POA: Diagnosis not present

## 2021-09-28 DIAGNOSIS — K259 Gastric ulcer, unspecified as acute or chronic, without hemorrhage or perforation: Secondary | ICD-10-CM

## 2021-09-28 DIAGNOSIS — R110 Nausea: Secondary | ICD-10-CM | POA: Diagnosis not present

## 2021-09-28 DIAGNOSIS — I8510 Secondary esophageal varices without bleeding: Secondary | ICD-10-CM | POA: Diagnosis not present

## 2021-09-28 DIAGNOSIS — K529 Noninfective gastroenteritis and colitis, unspecified: Secondary | ICD-10-CM | POA: Diagnosis present

## 2021-09-28 DIAGNOSIS — R0902 Hypoxemia: Secondary | ICD-10-CM | POA: Diagnosis not present

## 2021-09-28 DIAGNOSIS — Z833 Family history of diabetes mellitus: Secondary | ICD-10-CM

## 2021-09-28 DIAGNOSIS — G43909 Migraine, unspecified, not intractable, without status migrainosus: Secondary | ICD-10-CM | POA: Diagnosis not present

## 2021-09-28 DIAGNOSIS — D75838 Other thrombocytosis: Secondary | ICD-10-CM | POA: Diagnosis present

## 2021-09-28 DIAGNOSIS — D735 Infarction of spleen: Secondary | ICD-10-CM | POA: Diagnosis present

## 2021-09-28 DIAGNOSIS — R109 Unspecified abdominal pain: Secondary | ICD-10-CM | POA: Diagnosis not present

## 2021-09-28 DIAGNOSIS — D259 Leiomyoma of uterus, unspecified: Secondary | ICD-10-CM | POA: Diagnosis not present

## 2021-09-28 DIAGNOSIS — I959 Hypotension, unspecified: Secondary | ICD-10-CM | POA: Diagnosis not present

## 2021-09-28 DIAGNOSIS — D649 Anemia, unspecified: Secondary | ICD-10-CM | POA: Diagnosis not present

## 2021-09-28 DIAGNOSIS — Z86718 Personal history of other venous thrombosis and embolism: Secondary | ICD-10-CM | POA: Diagnosis not present

## 2021-09-28 DIAGNOSIS — Z8719 Personal history of other diseases of the digestive system: Secondary | ICD-10-CM

## 2021-09-28 DIAGNOSIS — R1084 Generalized abdominal pain: Secondary | ICD-10-CM | POA: Diagnosis not present

## 2021-09-28 DIAGNOSIS — B9681 Helicobacter pylori [H. pylori] as the cause of diseases classified elsewhere: Secondary | ICD-10-CM | POA: Diagnosis not present

## 2021-09-28 LAB — HEPATIC FUNCTION PANEL
ALT: 16 U/L (ref 0–44)
AST: 28 U/L (ref 15–41)
Albumin: 2.4 g/dL — ABNORMAL LOW (ref 3.5–5.0)
Alkaline Phosphatase: 83 U/L (ref 38–126)
Bilirubin, Direct: 0.4 mg/dL — ABNORMAL HIGH (ref 0.0–0.2)
Indirect Bilirubin: 1.6 mg/dL — ABNORMAL HIGH (ref 0.3–0.9)
Total Bilirubin: 2 mg/dL — ABNORMAL HIGH (ref 0.3–1.2)
Total Protein: 4 g/dL — ABNORMAL LOW (ref 6.5–8.1)

## 2021-09-28 LAB — CBC WITH DIFFERENTIAL/PLATELET
Abs Immature Granulocytes: 1.01 10*3/uL — ABNORMAL HIGH (ref 0.00–0.07)
Basophils Absolute: 0.2 10*3/uL — ABNORMAL HIGH (ref 0.0–0.1)
Basophils Relative: 1 %
Eosinophils Absolute: 0.5 10*3/uL (ref 0.0–0.5)
Eosinophils Relative: 2 %
HCT: 13.6 % — ABNORMAL LOW (ref 36.0–46.0)
Hemoglobin: 4 g/dL — CL (ref 12.0–15.0)
Immature Granulocytes: 3 %
Lymphocytes Relative: 4 %
Lymphs Abs: 1.3 10*3/uL (ref 0.7–4.0)
MCH: 28.6 pg (ref 26.0–34.0)
MCHC: 29.4 g/dL — ABNORMAL LOW (ref 30.0–36.0)
MCV: 97.1 fL (ref 80.0–100.0)
Monocytes Absolute: 2.8 10*3/uL — ABNORMAL HIGH (ref 0.1–1.0)
Monocytes Relative: 9 %
Neutro Abs: 24.7 10*3/uL — ABNORMAL HIGH (ref 1.7–7.7)
Neutrophils Relative %: 81 %
Platelets: 1026 10*3/uL (ref 150–400)
RBC: 1.4 MIL/uL — ABNORMAL LOW (ref 3.87–5.11)
RDW: 26.1 % — ABNORMAL HIGH (ref 11.5–15.5)
Smear Review: INCREASED
WBC: 30.5 10*3/uL — ABNORMAL HIGH (ref 4.0–10.5)
nRBC: 4.4 % — ABNORMAL HIGH (ref 0.0–0.2)

## 2021-09-28 LAB — ABO/RH: ABO/RH(D): O POS

## 2021-09-28 LAB — BASIC METABOLIC PANEL
Anion gap: 8 (ref 5–15)
BUN: 18 mg/dL (ref 8–23)
CO2: 21 mmol/L — ABNORMAL LOW (ref 22–32)
Calcium: 7.9 mg/dL — ABNORMAL LOW (ref 8.9–10.3)
Chloride: 111 mmol/L (ref 98–111)
Creatinine, Ser: 0.67 mg/dL (ref 0.44–1.00)
GFR, Estimated: >60 mL/min (ref >60)
Glucose, Bld: 112 mg/dL — ABNORMAL HIGH (ref 70–99)
Potassium: 4.3 mmol/L (ref 3.5–5.1)
Sodium: 140 mmol/L (ref 135–145)

## 2021-09-28 LAB — LIPASE, BLOOD: Lipase: 22 U/L (ref 11–51)

## 2021-09-28 LAB — PREPARE RBC (CROSSMATCH)

## 2021-09-28 MED ORDER — SODIUM CHLORIDE 0.9 % IV SOLN: 10.0000 mL/h | Freq: Once | INTRAVENOUS | Status: DC

## 2021-09-28 MED ORDER — SODIUM CHLORIDE 0.9 % IV BOLUS
1000.0000 mL | Freq: Once | INTRAVENOUS | Status: AC
  Administered 2021-09-28: 20:00:00 1000 mL via INTRAVENOUS

## 2021-09-28 MED ORDER — HYDROMORPHONE HCL 1 MG/ML IJ SOLN
1.0000 mg | Freq: Once | INTRAMUSCULAR | Status: AC
  Administered 2021-09-28: 20:00:00 1 mg via INTRAVENOUS
  Filled 2021-09-28: qty 1

## 2021-09-28 MED ORDER — IOHEXOL 350 MG/ML SOLN
100.0000 mL | Freq: Once | INTRAVENOUS | Status: AC | PRN
  Administered 2021-09-28: 23:00:00 100 mL via INTRAVENOUS

## 2021-09-28 NOTE — ED Notes (Signed)
Patient transported to CT 

## 2021-09-28 NOTE — ED Provider Notes (Signed)
[facility] EMERGENCY DEPARTMENT Provider Note   CSN: [id number] Arrival date & time: 09/28/21  1748     History Chief Complaint  Patient presents with   Abdominal Pain    Courtney Buckley is a 70 y.o. female with medical history as noted below (recent hospitalization discharge copied below), presenting to [facility] with abdominal pain and weakness.  The patient reports that she has had persistent left upper quadrant abdominal pain and a sensation of "fullness" since leaving the hospital.  She states this was the reason they had opted for the splenic aneurysm embolization in the first place.  This procedure was done 10 days ago in the hospital.  Please see notes below from recent discharge.  She reports that she has felt lightheaded, weak, exhausted.  She has been using pain medications with little relief.  She is not currently on a blood thinner.  She is present with her husband at the bedside.  Discharge summary 09/20/21:  History of JAK 2 mutation, myleofibrosis, splenomegaly, cirrhosis, esophageal varices, chronic portal vein thrombosis with cavernous transformation and marked splenorenal portosystem collateralization. 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 Ms Courtney Buckley was deemed not to be a good surgical candidate for a splenectomy. The patient was seen for consultation in the [facility] on 11.8.37 with Dr. Shellia Buckley The patient was given several different treatment options and the patient decided to pursue catheter embolization of splenic artery aneurysm. On 11.17.22 Patient had a successful splenic angiogram with embolization of the distal artery aneurysm with a devascaularization of approximately 2.3 of the splenic volume.   HPI     Past Medical History:  Diagnosis Date   Cancer ([place])    Bone Marrow Cancer   Cirrhosis ([place])    Colon polyps    Depression    Esophageal varices  ([facility])    Gastritis    Internal hemorrhoids    Migraine    Portal hypertensive gastropathy ([facility])    PVT (portal vein thrombosis)     Patient Active Problem List   Diagnosis Date Noted   Splenic artery aneurysm ([place]) 09/18/2021   Myelofibrosis ([place]) 10/04/2020    Past Surgical History:  Procedure Laterality Date   IR ANGIOGRAM SELECTIVE EACH ADDITIONAL VESSEL  09/18/2021   IR ANGIOGRAM VISCERAL SELECTIVE  09/18/2021   IR EMBO ARTERIAL NOT HEMORR HEMANG INC GUIDE ROADMAPPING  09/18/2021   IR RADIOLOGIST EVAL & MGMT  09/09/2021   IR US GUIDE VASC ACCESS RIGHT  09/18/2021   LIVER BIOPSY     RADIOLOGY WITH ANESTHESIA N/A 09/18/2021   Procedure: IR WITH ANESTHESIA SPLENIC ARTERY EMBOLIZATION;  Surgeon: Criselda Peaches, MD;  Location: [facility];  Service: Radiology;  Laterality: N/A;     OB History     Gravida  2   Para  2   Term      Preterm      AB      Living         SAB      IAB      Ectopic      Multiple      Live Births              Family History  Problem Relation Age of Onset   Cancer - Lung Father    Diabetes Mellitus II Maternal Aunt    Stomach cancer Maternal Aunt    Diabetes Mellitus II Maternal Uncle    Colon  cancer Neg Hx    Rectal cancer Neg Hx    Esophageal cancer Neg Hx    Liver disease Neg Hx    Pancreatic cancer Neg Hx     Social History   Tobacco Use   Smoking status: Former    Types: Cigarettes   Smokeless tobacco: Never  Vaping Use   Vaping Use: Never used  Substance Use Topics   Alcohol use: Not Currently    Comment: Occasionally-1 acoholic beverage every 1-2 weeks   Drug use: No    Home Medications Prior to Admission medications   Medication Sig Start Date End Date Taking? Authorizing Provider  HYDROcodone-acetaminophen (NORCO) 10-325 MG tablet Take 1 tablet by mouth every 6 (six) hours as needed for up to 5 days for severe pain. 09/24/21 09/29/21  Candiss Norse A, PA-C  furosemide (LASIX) 20 MG tablet Take 1  tablet (20 mg total) by mouth 2 (two) times daily. Patient taking differently: Take 20 mg by mouth as needed for fluid or edema. 07/14/21 09/18/21  Allwardt, Alyssa M, PA-C  JAKAFI 10 MG tablet Take 10 mg by mouth 2 (two) times daily. 06/11/21   [provider]  lactulose (CHRONULAC) 10 GM/15ML solution Take 45 ml daily 09/05/21   Esterwood, Amy S, PA-C  methylPREDNISolone (MEDROL DOSEPAK) 4 MG TBPK tablet 6 day taper dose. 09/20/21   Jacqualine Mau, NP  moxifloxacin (AVELOX) 400 MG tablet Take 1 tablet (400 mg total) by mouth daily at 8 pm for 12 days. 09/20/21 10/02/21  Jacqualine Mau, NP  promethazine (PHENERGAN) 12.5 MG tablet Take 12.5 mg by mouth every 6 (six) hours as needed for nausea or vomiting.    [provider]    Allergies    Patient has no known allergies.  Review of Systems   Review of Systems  Constitutional:  Negative for chills and fever.  HENT:  Negative for ear pain and sore throat.   Eyes:  Negative for pain and visual disturbance.  Respiratory:  Negative for cough and shortness of breath.   Cardiovascular:  Negative for chest pain and palpitations.  Gastrointestinal:  Positive for abdominal pain and nausea. Negative for vomiting.  Musculoskeletal:  Negative for arthralgias and myalgias.  Skin:  Positive for color change. Negative for rash.  Neurological:  Positive for light-headedness. Negative for seizures and syncope.  All other systems reviewed and are negative.  Physical Exam Updated Vital Signs BP (!) 113/50   Pulse 87   Temp 98 F (36.7 C) (Oral)   Resp 11   Ht 5\' 6"  (1.676 m)   Wt 81.6 kg   SpO2 96%   BMI 29.05 kg/m   Physical Exam Constitutional:      General: She is not in acute distress. HENT:     Head: Normocephalic and atraumatic.  Eyes:     Conjunctiva/sclera: Conjunctivae normal.     Pupils: Pupils are equal, round, and reactive to light.  Cardiovascular:     Rate and Rhythm: Normal rate and regular  rhythm.  Pulmonary:     Effort: Pulmonary effort is normal. No respiratory distress.  Abdominal:     General: There is no distension.     Tenderness: There is abdominal tenderness in the left lower quadrant.  Skin:    General: Skin is warm and dry.     Coloration: Skin is pale.  Neurological:     General: No focal deficit present.     Mental Status: She is alert. Mental status  is at baseline.  Psychiatric:        Mood and Affect: Mood normal.        Behavior: Behavior normal.    ED Results / Procedures / Treatments   Labs (all labs ordered are listed, but only abnormal results are displayed) Labs Reviewed  BASIC METABOLIC PANEL - Abnormal; Notable for the following components:      Result Value   CO2 21 (*)    Glucose, Bld 112 (*)    Calcium 7.9 (*)    All other components within normal limits  CBC WITH DIFFERENTIAL/PLATELET - Abnormal; Notable for the following components:   WBC 30.5 (*)    RBC 1.40 (*)    Hemoglobin 4.0 (*)    HCT 13.6 (*)    MCHC 29.4 (*)    RDW 26.1 (*)    Platelets 1,026 (*)    nRBC 4.4 (*)    Neutro Abs 24.7 (*)    Monocytes Absolute 2.8 (*)    Basophils Absolute 0.2 (*)    Abs Immature Granulocytes 1.01 (*)    All other components within normal limits  HEPATIC FUNCTION PANEL - Abnormal; Notable for the following components:   Total Protein 4.0 (*)    Albumin 2.4 (*)    Total Bilirubin 2.0 (*)    Bilirubin, Direct 0.4 (*)    Indirect Bilirubin 1.6 (*)    All other components within normal limits  RESP PANEL BY RT-PCR (FLU A&B, COVID) ARPGX2  LIPASE, BLOOD  PATHOLOGIST SMEAR REVIEW  TYPE AND SCREEN  PREPARE RBC (CROSSMATCH)  ABO/RH    EKG None  Radiology No results found.  Procedures .Critical Care Performed by: Wyvonnia Dusky, MD Authorized by: Wyvonnia Dusky, MD   Critical care provider statement:    Critical care time (minutes):  45   Critical care time was exclusive of:  Separately billable procedures and treating  other patients   Critical care was necessary to treat or prevent imminent or life-threatening deterioration of the following conditions:  Circulatory failure   Critical care was time spent personally by me on the following activities:  Ordering and performing treatments and interventions, ordering and review of laboratory studies, ordering and review of radiographic studies, pulse oximetry, review of old charts, examination of patient and evaluation of patient's response to treatment Comments:     Symptomatic anemia, pRBC transfusions   Medications Ordered in ED Medications  0.9 %  sodium chloride infusion (0 mL/hr Intravenous Hold 09/28/21 2055)  sodium chloride 0.9 % bolus 1,000 mL (0 mLs Intravenous Stopped 09/28/21 2055)  HYDROmorphone (DILAUDID) injection 1 mg (1 mg Intravenous Given 09/28/21 1941)    ED Course  I have reviewed the triage vital signs and the nursing notes.  Pertinent labs & imaging results that were available during my care of the patient were reviewed by me and considered in my medical decision making (see chart for details).  Patient is here with abdominal pain, found to be acutely anemic with a hemoglobin of 4.0.  Platelets are also elevated.  This could be consistent with functional asplenia or postsplenectomy versus acute blood loss anemia versus other.  She has no immediate signs of infection, afebrile.  Her vital signs have been stable with regular blood pressure.  The patient was given some fluids as well as consented for 2 units of blood.  The IR attending Dr Pascal Lux was consulted as noted below recommending CT imaging.  I personally reviewed the patient's prior medical records  including her recent hospitalization course.   Clinical Course as of 09/28/21 2145  Nancy Fetter Sep 28, 2021  2023 Hgb reported at 4.4. [MT]  2040 Pt and her husband consented for blood transfusion, IR attending paged. [MT]  2045 I spoke to Dr Pascal Lux from radiology who is requesting stat  multi-phased CT scan, which he will discuss with radiology to ensure appropriate order.  If there is active bleeding Dr Pascal Lux is to be contacted back, if there is no active bleed the patient could be admitted to the hospitalist for transfusion and monitoring overnight. [MT]  2133 I do not see imaging ordered by Dr. Pascal Lux and therefore I have ordered the CT imaging as a triple phase study [MT]  2144 I spoke to [place] who are aware of appropriate order and will prioritize the patient.  I spoke to Dr Almyra Free EDP regarding signout and following up on CTA results.  If no active hemorrhage/bleed, patient could be admitted medically, but if there is concern for arterial or vascular bleeding on imaging IR attending should be paged back. [MT]    Clinical Course User Index [MT] Algie Cales, Carola Rhine, MD    Final Clinical Impression(s) / ED Diagnoses Final diagnoses:  Anemia, unspecified type    Rx / DC Orders ED Discharge Orders     None        Wyvonnia Dusky, MD 09/28/21 2145

## 2021-09-28 NOTE — ED Triage Notes (Signed)
Pt BIB EMS due to abd pain. Pt had abd surgery for abd aneurysm on the 17th. Pt states it feels like the same pain like before surgery. Pt has nausea for 2 days. Pt has a hx of multiple myeloma. Pt is axox4.

## 2021-09-29 ENCOUNTER — Telehealth: Payer: Self-pay

## 2021-09-29 ENCOUNTER — Encounter (HOSPITAL_COMMUNITY): Payer: Self-pay | Admitting: Internal Medicine

## 2021-09-29 DIAGNOSIS — D649 Anemia, unspecified: Secondary | ICD-10-CM

## 2021-09-29 DIAGNOSIS — D7581 Myelofibrosis: Secondary | ICD-10-CM | POA: Diagnosis not present

## 2021-09-29 DIAGNOSIS — D75839 Thrombocytosis, unspecified: Secondary | ICD-10-CM | POA: Diagnosis not present

## 2021-09-29 DIAGNOSIS — K529 Noninfective gastroenteritis and colitis, unspecified: Secondary | ICD-10-CM

## 2021-09-29 DIAGNOSIS — Z801 Family history of malignant neoplasm of trachea, bronchus and lung: Secondary | ICD-10-CM | POA: Diagnosis not present

## 2021-09-29 DIAGNOSIS — D72829 Elevated white blood cell count, unspecified: Secondary | ICD-10-CM

## 2021-09-29 DIAGNOSIS — R1084 Generalized abdominal pain: Secondary | ICD-10-CM | POA: Insufficient documentation

## 2021-09-29 DIAGNOSIS — Z800 Family history of malignant neoplasm of digestive organs: Secondary | ICD-10-CM | POA: Diagnosis not present

## 2021-09-29 DIAGNOSIS — Z87891 Personal history of nicotine dependence: Secondary | ICD-10-CM | POA: Diagnosis not present

## 2021-09-29 HISTORY — DX: Noninfective gastroenteritis and colitis, unspecified: K52.9

## 2021-09-29 HISTORY — DX: Elevated white blood cell count, unspecified: D72.829

## 2021-09-29 LAB — CBC WITH DIFFERENTIAL/PLATELET
Abs Immature Granulocytes: 0.6 10*3/uL — ABNORMAL HIGH (ref 0.00–0.07)
Basophils Absolute: 0.9 10*3/uL — ABNORMAL HIGH (ref 0.0–0.1)
Basophils Relative: 3 %
Eosinophils Absolute: 0.6 10*3/uL — ABNORMAL HIGH (ref 0.0–0.5)
Eosinophils Relative: 2 %
HCT: 23.8 % — ABNORMAL LOW (ref 36.0–46.0)
Hemoglobin: 7.8 g/dL — ABNORMAL LOW (ref 12.0–15.0)
Lymphocytes Relative: 0 %
Lymphs Abs: 0 10*3/uL — ABNORMAL LOW (ref 0.7–4.0)
MCH: 29.7 pg (ref 26.0–34.0)
MCHC: 32.8 g/dL (ref 30.0–36.0)
MCV: 90.5 fL (ref 80.0–100.0)
Metamyelocytes Relative: 2 %
Monocytes Absolute: 0.6 10*3/uL (ref 0.1–1.0)
Monocytes Relative: 2 %
Neutro Abs: 28.3 10*3/uL — ABNORMAL HIGH (ref 1.7–7.7)
Neutrophils Relative %: 91 %
Platelets: 838 10*3/uL — ABNORMAL HIGH (ref 150–400)
RBC: 2.63 MIL/uL — ABNORMAL LOW (ref 3.87–5.11)
RDW: 20 % — ABNORMAL HIGH (ref 11.5–15.5)
WBC: 31.1 10*3/uL — ABNORMAL HIGH (ref 4.0–10.5)
nRBC: 7 % — ABNORMAL HIGH (ref 0.0–0.2)

## 2021-09-29 LAB — HEMOGLOBIN AND HEMATOCRIT, BLOOD
HCT: 22.9 % — ABNORMAL LOW (ref 36.0–46.0)
Hemoglobin: 7.1 g/dL — ABNORMAL LOW (ref 12.0–15.0)

## 2021-09-29 LAB — PROCALCITONIN: Procalcitonin: 0.13 ng/mL

## 2021-09-29 LAB — LACTIC ACID, PLASMA: Lactic Acid, Venous: 1 mmol/L (ref 0.5–1.9)

## 2021-09-29 LAB — RESP PANEL BY RT-PCR (FLU A&B, COVID) ARPGX2
Influenza A by PCR: NEGATIVE
Influenza B by PCR: NEGATIVE
SARS Coronavirus 2 by RT PCR: NEGATIVE

## 2021-09-29 LAB — PREPARE RBC (CROSSMATCH)

## 2021-09-29 MED ORDER — MORPHINE SULFATE (PF) 2 MG/ML IV SOLN
1.0000 mg | INTRAVENOUS | Status: DC | PRN
  Administered 2021-09-29 (×2): 1 mg via INTRAVENOUS
  Filled 2021-09-29 (×2): qty 1

## 2021-09-29 MED ORDER — HYDROMORPHONE HCL 1 MG/ML IJ SOLN
1.0000 mg | INTRAMUSCULAR | Status: DC | PRN
  Administered 2021-09-29: 1 mg via INTRAVENOUS
  Filled 2021-09-29: qty 1

## 2021-09-29 MED ORDER — SODIUM CHLORIDE 0.9% IV SOLUTION: Freq: Once | INTRAVENOUS | Status: DC

## 2021-09-29 MED ORDER — PANTOPRAZOLE SODIUM 40 MG IV SOLR
40.0000 mg | Freq: Two times a day (BID) | INTRAVENOUS | Status: DC
  Administered 2021-09-29 (×2): 40 mg via INTRAVENOUS
  Filled 2021-09-29 (×3): qty 40

## 2021-09-29 MED ORDER — ONDANSETRON HCL 4 MG/2ML IJ SOLN
4.0000 mg | Freq: Four times a day (QID) | INTRAMUSCULAR | Status: DC | PRN
  Administered 2021-09-29 – 2021-10-01 (×4): 4 mg via INTRAVENOUS
  Filled 2021-09-29 (×4): qty 2

## 2021-09-29 MED ORDER — ACETAMINOPHEN 650 MG RE SUPP
650.0000 mg | Freq: Four times a day (QID) | RECTAL | Status: DC | PRN
  Administered 2021-09-29: 08:00:00 650 mg via RECTAL
  Filled 2021-09-29: qty 1

## 2021-09-29 MED ORDER — HYDROMORPHONE HCL 1 MG/ML IJ SOLN
1.0000 mg | INTRAMUSCULAR | Status: DC | PRN
  Administered 2021-09-29 – 2021-10-01 (×5): 1 mg via INTRAVENOUS
  Filled 2021-09-29 (×5): qty 1

## 2021-09-29 MED ORDER — ACETAMINOPHEN 325 MG PO TABS
650.0000 mg | ORAL_TABLET | Freq: Four times a day (QID) | ORAL | Status: DC | PRN
  Administered 2021-09-29 – 2021-10-02 (×3): 650 mg via ORAL
  Filled 2021-09-29 (×5): qty 2

## 2021-09-29 MED ORDER — SODIUM CHLORIDE 0.9 % IV SOLN
1.0000 g | Freq: Every day | INTRAVENOUS | Status: DC
  Administered 2021-09-29 – 2021-09-30 (×2): 1 g via INTRAVENOUS
  Filled 2021-09-29 (×3): qty 10

## 2021-09-29 MED ORDER — METRONIDAZOLE 500 MG/100ML IV SOLN
500.0000 mg | Freq: Three times a day (TID) | INTRAVENOUS | Status: DC
  Administered 2021-09-29 – 2021-10-02 (×10): 500 mg via INTRAVENOUS
  Filled 2021-09-29 (×10): qty 100

## 2021-09-29 NOTE — Telephone Encounter (Signed)
Nurse: Roselie Awkward, RN, Heather Date/Time Eilene Ghazi Time): 09/28/2021 4:29:29 PM Confirm and document reason for call. If symptomatic, describe symptoms. ---Caller states his wife has bone marrow cancer, sclerosis of the liver, and had a stomach aneurism. She has great pain in her stomach and chest that goes all the way around her back. Had surgery for aneurysm on 11/17. Pain 9-10/10. Mostly in her abdomen. Poor appetite.  Does the patient have any new or worsening symptoms? ---Yes Will a triage be completed? ---Yes Related visit to physician within the last 2 weeks? ---Yes Does the PT have any chronic conditions? (i.e. diabetes, asthma, this includes High risk factors for pregnancy, etc.) ---Yes List chronic conditions. ---bone marrow cancer, sclerosis of the liver, and had a stomach aneurism. Is this a behavioral health or substance abuse call? ---No  Abdominal Pain - Female Shock suspected (e.g., cold/pale/clammy skin, too weak to stand, low BP, rapid pulse) Roselie Awkward, RN, Nira Conn 09/28/2021 4:31:54 PM  09/28/2021 4:27:50 PM Send to Urgent Reinaldo Berber 09/28/2021 4:51:21 PM 911 Outcome Documentation Roselie Awkward RN, Nira Conn Reason: Osvaldo Human states he is calling 911 09/28/2021 4:44:20 PM Call EMS 911 Now Yes Roselie Awkward, RN, [employer]

## 2021-09-29 NOTE — H&P (View-Only) (Signed)
Attending physician's note   I have taken an interval history, reviewed the chart and examined the patient. I agree with the Advanced Practitioner's note, impression, and recommendations as outlined.   70 year old female with medical history as outlined below, to include history of cryptogenic cirrhosis (c/b esophageal varices, splenomegaly, hepatic encephalopathy), chronic portal vein thrombosis, JAK2 mutation, myelofibrosis, PUD.  Additionally, recently diagnosed with splenic artery aneurysm which was treated with IR ablation on 09/18/2021.  She presents today with abdominal pain along with dark stools.  Stools have returned to normal color over the last 2 days or so.  Her abdominal pain has actually been present for the last 10 weeks or so, and was the reason for CT on 10/25 which demonstrated the saccular aneurysm of the splenic artery.  MELD on 11/2 was 17 (but has historically elevated indirect bilirubin)  Preoperative labs from 11/17 n/f the following: - WBC 7.2 - H/H 10.3/34.7 - PLT 258 - INR 1.7 - Normal BMP  Labs from 11/17 and 11/18: - WBC 31 --> 24 - H/H 9.7/30 --> 8.6/28 - PLT 377 --> 329  Admission labs notable for the following: - WBC 30.5 - H/H 4/13.6 - PLT 1026 - Normal BUN/creatinine, lipase, PCT, lactate - T bili 2.0,Dbili 0.4 - CT angiography: No active bleed/extravasation, descending colitis, infarcted spleen with increased splenomegaly, right hepatic and angioma, sludge vs stones without GB wall thickening or duct dilation.  No splenic hemorrhage  Transfused 3 unit PRBCs  1) Abdominal pain - Difficult to parse out which of her pain is new vs somewhat chronic in nature.  She was seen in the GI clinic at the end of October for 6+ weeks of pain at that time, and was the indication for CT imaging that then diagnosed the splenic artery aneurysm.  Unsure if the descending: Thickening on this admission CT is related or incidental. - Pain control per primary  service - If endoscopic evaluation and IR/Hematology evaluations all unrevealing, possibly HIDA scan?  2) Splenic artery aneurysm s/p IR embolization 11/17 3) Leukocytosis 4) Acute on chronic Anemia 5) Myelofibrosis - Agree with IR and Hematology consultation - Plan for EGD and flexible sigmoidoscopy tomorrow for diagnostic and potentially therapeutic intent - Started on empiric ABX - Continue serial H/H checks with additional blood products as needed per protocol - Timing to restart Jakafi per Hematology service  6) Cryptogenic Cirrhosis 7) Esophageal varices 8) Hepatic encephalopathy 9) Splenomegaly 10) Dark stools - EGD tomorrow to evaluate for esophageal varices, portal hypertensive gastropathy - Continue rifaximin/lactulose that were recently started  The indications, risks, and benefits of EGD and flexible sigmoidoscopy were explained to the patient in detail. Risks include but are not limited to bleeding, perforation, adverse reaction to medications, and cardiopulmonary compromise. Sequelae include but are not limited to the possibility of surgery, hospitalization, and mortality. The patient verbalized understanding and wished to proceed. All questions answered, referred to scheduler. Further recommendations pending results of the exam.     Gerrit Heck, DO, FACG [phone] office         Consultation  Referring Provider: Meredith Mody MD Primary Care Physician:  Allwardt, Randa Evens, PA-C Primary Gastroenterologist:  Dr.Pyrtle  Reason for Consultation: Anemia, abdominal pain  HPI: Courtney Buckley is a 70 y.o. female, known to Dr. Hilarie Fredrickson with history of cryptogenic cirrhosis, chronic portal vein thrombosis, prior history of peptic ulcer disease, and a myelodysplastic syndrome/myelofibrosis for which she has been on Jakafi. Patient was seen in the  office on 09/03/2021 with complaints of 6-week history of abdominal pain, intense at times and centered in the mid  abdomen, usually worse at night..  CT of the abdomen and pelvis had been done on 08/26/2021 which did show a 2.9 x 3.3 x 3 cm saccular aneurysm of the distal splenic artery without appreciable thrombus and chronic portal vein occlusion with extensive venous collateralization, small gallstones and a previously characterized hemangioma measuring 13 mm in the right liver. She was then set up for IR consultation and ultimately underwent catheter embolization of the aneurysm on 09/18/2021. Patient relates that she did have pain postprocedure which she was told to expect.  Within a day of having the procedure she was also having diarrhea and describes this is being very dark in color and lasting for 2 to 3 daysSubsided.  She is now having more normal bowel movements with no obvious blood and brown in color.  Her appetite overall has been poor, she had some improvement in the abdominal pain which then abruptly worsened about 48 hours ago and has been severe since.  Nausea without vomiting, no fever or chills.  She complains of pain fairly diffusely in her abdomen.   ER evaluation last evening with CT angio showing a descending colitis, and infarction of the spleen of approximately 75% of the splenic parenchyma.  Also with interval increase in size of the enlarged spleen now measuring up to 18.5 cm, no definite active hemorrhage of the splenic parenchyma noted.  Small gallstones versus sludge, stomach within normal limits, no free fluid in the pelvis.  .  Show WBC of 30.5, hemoglobin 4, platelets 1026 LFTs normal with exception of T bili of 2 Lactate within normal limits, procalcitonin 0.13  Hemoglobin up to 7.8 post 2 units of packed RBCs-WBC remains elevated at 31.1  She has been started on ceftriaxone and metronidazole.  She denies taking any NSAIDs over the past several days, has been using Tylenol and took perhaps 1 Advil.  She had a prescription for hydrocodone which was making her feel nauseated and  dizzy.  Last EGD September 2021 with grade 2 varices in the lower one third of the esophagus and 4 mm AVM in the gastric body Colonoscopy September 2020 with 1 5 mm polyp, and internal hemorrhoids otherwise negative.   Past Medical History:  Diagnosis Date  . Cancer [facility])    Bone Marrow Cancer  . Cirrhosis ([place])   . Colon polyps   . Depression   . Esophageal varices ([place])   . Gastritis   . Internal hemorrhoids   . Migraine   . Portal hypertensive gastropathy ([place])   . PVT (portal vein thrombosis)     Past Surgical History:  Procedure Laterality Date  . IR ANGIOGRAM SELECTIVE EACH ADDITIONAL VESSEL  09/18/2021  . IR ANGIOGRAM VISCERAL SELECTIVE  09/18/2021  . IR EMBO ARTERIAL NOT HEMORR HEMANG INC GUIDE ROADMAPPING  09/18/2021  . IR RADIOLOGIST EVAL & MGMT  09/09/2021  . IR US GUIDE VASC ACCESS RIGHT  09/18/2021  . LIVER BIOPSY    . RADIOLOGY WITH ANESTHESIA N/A 09/18/2021   Procedure: IR WITH ANESTHESIA SPLENIC ARTERY EMBOLIZATION;  Surgeon: Criselda Peaches, MD;  Location: [place];  Service: Radiology;  Laterality: N/A;    Prior to Admission medications   Medication Sig Start Date End Date Taking? Authorizing Provider  docusate sodium (COLACE) 100 MG capsule Take 100 mg by mouth See admin instructions. Hs x 10 days   Yes [provider]  furosemide (LASIX) 20 MG tablet Take 1 tablet (20 mg total) by mouth 2 (two) times daily. 07/14/21 09/28/21 Yes Allwardt, Randa Evens, PA-C  HYDROcodone-acetaminophen (NORCO) 10-325 MG tablet Take 1 tablet by mouth every 6 (six) hours as needed for up to 5 days for severe pain. Patient taking differently: Take 0.5 tablets by mouth every 6 (six) hours as needed for severe pain. 09/24/21 09/29/21 Yes Candiss Norse A, PA-C  ibuprofen (ADVIL) 200 MG tablet Take 400 mg by mouth every 6 (six) hours as needed for headache or moderate pain.   Yes [provider]  JAKAFI 10 MG tablet Take 10 mg by mouth 2 (two) times daily. 06/11/21   Yes [provider]  methylPREDNISolone (MEDROL DOSEPAK) 4 MG TBPK tablet 6 day taper dose. Patient taking differently: Take 4 mg by mouth See admin instructions. 4,1,6,6,0,6 09/20/21  Yes Jacqualine Mau, NP  moxifloxacin (AVELOX) 400 MG tablet Take 1 tablet (400 mg total) by mouth daily at 8 pm for 12 days. Patient taking differently: Take 400 mg by mouth See admin instructions. At 8 pm x 12 days 09/20/21 10/02/21 Yes Jacqualine Mau, NP  Multiple Vitamin (MULTI VITAMIN PO) Take 1 tablet by mouth daily.   Yes [provider]  promethazine (PHENERGAN) 12.5 MG tablet Take 12.5 mg by mouth every 6 (six) hours as needed for nausea or vomiting.   Yes [provider]  rifaximin (XIFAXAN) 550 MG TABS tablet Take 550 mg by mouth 2 (two) times daily.   Yes [provider]  lactulose (CHRONULAC) 10 GM/15ML solution Take 45 ml daily Patient not taking: Reported on 09/28/2021 09/05/21   Alfredia Ferguson, PA-C    Current Facility-Administered Medications  Medication Dose Route Frequency Provider Last Rate Last Admin  . 0.9 %  sodium chloride infusion (Manually program via Guardrails IV Fluids)   Intravenous Once Shela Leff, MD      . 0.9 %  sodium chloride infusion  10 mL/hr Intravenous Once Shela Leff, MD   Held at 09/28/21 2055  . acetaminophen (TYLENOL) tablet 650 mg  650 mg Oral Q6H PRN Shela Leff, MD       Or  . acetaminophen (TYLENOL) suppository 650 mg  650 mg Rectal Q6H PRN Shela Leff, MD   650 mg at 09/29/21 0813  . cefTRIAXone (ROCEPHIN) 1 g in sodium chloride 0.9 % 100 mL IVPB  1 g Intravenous Q0600 Shela Leff, MD   Stopped at 09/29/21 [phone]  . metroNIDAZOLE (FLAGYL) IVPB 500 mg  500 mg Intravenous Quay Burow, MD   Stopped at 09/29/21 [phone]  . morphine 2 MG/ML injection 1 mg  1 mg Intravenous Q4H PRN Shela Leff, MD   1 mg at 09/29/21 1255  . ondansetron (ZOFRAN) injection 4 mg  4 mg  Intravenous Q6H PRN Shela Leff, MD   4 mg at 09/29/21 1301   Current Outpatient Medications  Medication Sig Dispense Refill  . docusate sodium (COLACE) 100 MG capsule Take 100 mg by mouth See admin instructions. Hs x 10 days    . furosemide (LASIX) 20 MG tablet Take 1 tablet (20 mg total) by mouth 2 (two) times daily. 60 tablet 0  . HYDROcodone-acetaminophen (NORCO) 10-325 MG tablet Take 1 tablet by mouth every 6 (six) hours as needed for up to 5 days for severe pain. (Patient taking differently: Take 0.5 tablets by mouth every 6 (six) hours as needed for severe pain.) 20 tablet 0  . ibuprofen (ADVIL) 200 MG  tablet Take 400 mg by mouth every 6 (six) hours as needed for headache or moderate pain.    Marland Kitchen JAKAFI 10 MG tablet Take 10 mg by mouth 2 (two) times daily.    . methylPREDNISolone (MEDROL DOSEPAK) 4 MG TBPK tablet 6 day taper dose. (Patient taking differently: Take 4 mg by mouth See admin instructions. 6,5,4,3,2,1) 21 tablet 0  . moxifloxacin (AVELOX) 400 MG tablet Take 1 tablet (400 mg total) by mouth daily at 8 pm for 12 days. (Patient taking differently: Take 400 mg by mouth See admin instructions. At 8 pm x 12 days) 12 tablet 0  . Multiple Vitamin (MULTI VITAMIN PO) Take 1 tablet by mouth daily.    . promethazine (PHENERGAN) 12.5 MG tablet Take 12.5 mg by mouth every 6 (six) hours as needed for nausea or vomiting.    . rifaximin (XIFAXAN) 550 MG TABS tablet Take 550 mg by mouth 2 (two) times daily.    Marland Kitchen lactulose (CHRONULAC) 10 GM/15ML solution Take 45 ml daily (Patient not taking: Reported on 09/28/2021) 946 mL 11    Allergies as of 09/28/2021  . (No Known Allergies)    Family History  Problem Relation Age of Onset  . Cancer - Lung Father   . Diabetes Mellitus II Maternal Aunt   . Stomach cancer Maternal Aunt   . Diabetes Mellitus II Maternal Uncle   . Colon cancer Neg Hx   . Rectal cancer Neg Hx   . Esophageal cancer Neg Hx   . Liver disease Neg Hx   . Pancreatic  cancer Neg Hx     Social History   Socioeconomic History  . Marital status: Significant Other    Spouse name: Not on file  . Number of children: 2  . Years of education: Not on file  . Highest education level: Not on file  Occupational History  . Not on file  Tobacco Use  . Smoking status: Former    Types: Cigarettes  . Smokeless tobacco: Never  Vaping Use  . Vaping Use: Never used  Substance and Sexual Activity  . Alcohol use: Not Currently    Comment: Occasionally-1 acoholic beverage every 1-2 weeks  . Drug use: No  . Sexual activity: Not on file  Other Topics Concern  . Not on file  Social History Narrative  . Not on file   Social Determinants of Health   Financial Resource Strain: Low Risk   . Difficulty of Paying Living Expenses: Not hard at all  Food Insecurity: No Food Insecurity  . Worried About Charity fundraiser in the Last Year: Never true  . Ran Out of Food in the Last Year: Never true  Transportation Needs: No Transportation Needs  . Lack of Transportation (Medical): No  . Lack of Transportation (Non-Medical): No  Physical Activity: Insufficiently Active  . Days of Exercise per Week: 3 days  . Minutes of Exercise per Session: 20 min  Stress: No Stress Concern Present  . Feeling of Stress : Only a little  Social Connections: Moderately Isolated  . Frequency of Communication with Friends and Family: More than three times a week  . Frequency of Social Gatherings with Friends and Family: More than three times a week  . Attends Religious Services: Never  . Active Member of Clubs or Organizations: No  . Attends Archivist Meetings: Never  . Marital Status: Married  Human resources officer Violence: Not At Risk  . Fear of Current or Ex-Partner: No  .  Emotionally Abused: No  . Physically Abused: No  . Sexually Abused: No    Review of Systems: Pertinent positive and negative review of systems were noted in the above HPI section.  All other review of  systems was otherwise negative.   Physical Exam: Vital signs in last 24 hours: Temp:  [97.7 F (36.5 C)-98.2 F (36.8 C)] 97.7 F (36.5 C) (11/28 0817) Pulse Rate:  [80-93] 92 (11/28 1301) Resp:  [11-23] 19 (11/28 1301) BP: (97-131)/(48-92) 121/59 (11/28 1301) SpO2:  [91 %-100 %] 95 % (11/28 1301) Weight:  [81.6 kg] 81.6 kg (11/27 1812)   General:   Alert,  Well-developed, well-nourished, very uncomfortable appearing older white female cooperative in NAD.  Stationed in the hallway in the ER, husband at bedside Head:  Normocephalic and atraumatic. Eyes:  Sclera clear, no icterus.   Conjunctiva pale Ears:  Normal auditory acuity. Nose:  No deformity, discharge,  or lesions. Mouth:  No deformity or lesions.   Neck:  Supple; no masses or thyromegaly. Lungs:  Clear throughout to auscultation.   No wheezes, crackles, or rhonchi.  Heart:  Regular rate and rhythm; no murmurs, clicks, rubs,  or gallops. Abdomen:  Soft, she is diffusely tender throughout the abdomen, no rebound bowel sounds present but decreased Rectal: Not done, documented heme positive Msk:  Symmetrical without gross deformities. . Pulses:  Normal pulses noted. Extremities:  Without clubbing or edema. Neurologic:  Alert and  oriented x4;  grossly normal neurologically. Skin:  Intact without significant lesions or rashes.. Psych:  Alert and cooperative. Normal mood and affect.  Intake/Output from previous day: 11/27 0701 - 11/28 0700 In: 1414 [Blood:315; IV Piggyback:1099] Out: -  Intake/Output this shift: No intake/output data recorded.  Lab Results: Recent Labs    09/28/21 1933 09/29/21 0939  WBC 30.5* 31.1*  HGB 4.0* 7.8*  HCT 13.6* 23.8*  PLT 1,026* 838*   BMET Recent Labs    09/28/21 1933  NA 140  K 4.3  CL 111  CO2 21*  GLUCOSE 112*  BUN 18  CREATININE 0.67  CALCIUM 7.9*   LFT Recent Labs    09/28/21 1933  PROT 4.0*  ALBUMIN 2.4*  AST 28  ALT 16  ALKPHOS 83  BILITOT 2.0*  BILIDIR  0.4*  IBILI 1.6*   PT/INR No results for input(s): LABPROT, INR in the last 72 hours. Hepatitis Panel No results for input(s): HEPBSAG, HCVAB, HEPAIGM, HEPBIGM in the last 72 hours.    IMPRESSION:  #26 70 year old white female with history of cryptogenic cirrhosis complicated by chronic portal vein thrombosis, esophageal varices, splenomegaly, who had presented about a month ago with abdominal pain x6 weeks, varying in intensity Found on CT imaging to have an enlarging splenic artery aneurysm. Patient underwent IR catheter embolization on 09/18/2021  Patient developed dark stools/melena within 24 hours of procedure by her report which lasted for couple of days and then resolved She had pain immediately postprocedure which improved after a couple of days though never resolved and then abruptly worsened about 48 hours ago and has been severe.  Found to have significant leukocytosis with WBC of 31,000, hemoglobin of 4. CT angio showed a descending colitis, infarcted spleen with infarction of up to 75% of the splenic parenchyma and increase in splenic size No evidence for active bleeding and no evidence for splenic bleed by imaging,  Etiology of the above constellation of signs and symptoms is not entirely clear. Certainly splenic infarction and/or progressive infarction may explain her pain  and leukocytosis. Questioning if she is sequestering blood in the spleen  Etiology of the melena also not entirely clear, this may be explained by a descending colitis-question ischemic colitis secondary to embolization procedure or subsequent relative hypotension  She is not actively bleeding today, doubt any recent variceal bleed. Cannot rule out peptic ulcer disease.  #2 small gallstones versus sludge #3 myelofibrosis-treated with Jakafi 4 history of esophageal varices-grade 2 varices documented on EGD September 2021, no portal gastropathy had 1 4 mm AVM in the gastric body  Plan; clear liquid  diet, n.p.o. after midnight IV PPI twice daily Serial hemoglobins and transfuse to keep hemoglobin in the 7-8 range Hematology consultation I have asked IR to consult as well Pain control-morphine is not working, Switch to Dilaudid Continue IV antibiotics Patient has been scheduled for EGD and flexible sigmoidoscopy with Dr. Bryan Lemma tomorrow morning.  Both procedures were discussed with the patient including indications risks and benefits and she is agreeable to proceed.  GI will continue to follow.     Amy Esterwood PA-C 09/29/2021, 1:21 PM

## 2021-09-29 NOTE — Progress Notes (Signed)
  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 Not a candidate for splenectomy-underwent embolization of splenic artery aneurysm 09/18/2021 Complained of abdominal pain and came to ED found to have hemoglobin 4 WBC 30 platelet count 1 million CTA negative for hemoperitoneum active bleeding  OBJECTIVE: Awake coherent slight disocomfort Ctab no added sound No ict no pallor Abd soft nt--Significant HSM below rib cage Neuro intact   Hospital-Problem based course  Colitis No reports of diarrhea etc. however given significant leukocytosis elected to start ceftriaxone Flagyl Follow blood culture x2 Follow fecal occult blood Leukocytosis severe anemia splenomegaly myelofibrosis JAK2 V6 13F mutation Anemia likely related to NSAID use and Esoph Varices Baseline hemoglobin 8 platelets 300 Currently WBC 30 hemoglobin 4 platelet 1 million Transfusing 2 units PRBC-follow-up peripheral smear ? UGIB She tells em she had several days of dark unformed stool after her procedure--only becoming more solid yesterday assosc wit svere upper abd/CP--she has been taking NSAIDs for 3-4 days as Hydrocodone given to her post procedure made her feel dizzy Keep on clears only for now I have consulted [facility] to see in consult as she might have bleeding varices  Discussed with husband in detail  Verneita Griffes, MD [facility] 11:31 AM

## 2021-09-29 NOTE — H&P (Signed)
History and Physical    Courtney Buckley QIH:[id number] DOB: 29-Apr-1951 DOA: 09/28/2021  PCP: Fredirick Lathe, PA-C Patient coming from: Home  Chief Complaint: Abdominal pain  HPI: Courtney Buckley is a 70 y.o. 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  Patient underwent catheter embolization of splenic artery aneurysm on 09/18/2021.  She presented to the ED yesterday complaining of abdominal pain.  Hemodynamically stable.  Labs notable for WBC 30.5, hemoglobin 4.0, MCV 97.1, platelet count 1026k.  Labs done 9 days ago showing WBC 24.2, hemoglobin 8.6, platelet count 329k.  T bili 2.0 (chronically elevated), remainder of LFTs normal.  Lipase normal.  CTA abdomen/pelvis negative for hemoperitoneum or active bleeding.  Showing interval development of descending colitis; no active GI bleed.  Infarcted spleen with interval increase in splenomegaly; no findings to suggest definite active splenic hemorrhage. Patient was given 1 L normal saline bolus, Dilaudid, and Zofran.  2 units PRBCs ordered.  ED physician discussed the case with Dr. Lorenso Courier from hematology, will consult in a.m.  Patient reports chronic generalized abdominal pain, worse for the past 2 days and associated with nausea and dry heaves.  Pain is mostly in the left upper quadrant. Denies diarrhea or fevers.  States after her recent splenic artery aneurysm surgery she had dark stools for a few days.  Denies hematemesis or hematochezia.  No other complaints.  Denies cough, shortness of breath, or chest pain.  Denies dysuria or urinary frequency/urgency.  Review of Systems:  All systems reviewed and apart from history of presenting illness, are negative.  Past Medical  History:  Diagnosis Date   Cancer ([place])    Bone Marrow Cancer   Cirrhosis ([place])    Colon polyps    Depression    Esophageal varices ([facility])    Gastritis    Internal hemorrhoids    Migraine    Portal hypertensive gastropathy ([facility])    PVT (portal vein thrombosis)     Past Surgical History:  Procedure Laterality Date   IR ANGIOGRAM SELECTIVE EACH ADDITIONAL VESSEL  09/18/2021   IR ANGIOGRAM VISCERAL SELECTIVE  09/18/2021   IR EMBO ARTERIAL NOT HEMORR HEMANG INC GUIDE ROADMAPPING  09/18/2021   IR RADIOLOGIST EVAL & MGMT  09/09/2021   IR US GUIDE VASC ACCESS RIGHT  09/18/2021   LIVER BIOPSY     RADIOLOGY WITH ANESTHESIA N/A 09/18/2021   Procedure: IR WITH ANESTHESIA SPLENIC ARTERY EMBOLIZATION;  Surgeon: Criselda Peaches, MD;  Location: [facility];  Service: Radiology;  Laterality: N/A;     reports that she has quit smoking. Her smoking use included cigarettes. She has never used smokeless tobacco. She reports that she does not currently use alcohol. She reports that she does not use drugs.  No Known Allergies  Family History  Problem Relation Age of Onset   Cancer - Lung Father    Diabetes Mellitus II Maternal Aunt    Stomach cancer Maternal Aunt    Diabetes Mellitus II Maternal Uncle    Colon cancer Neg Hx    Rectal cancer Neg Hx    Esophageal cancer Neg Hx    Liver disease Neg Hx    Pancreatic cancer Neg Hx     Prior to Admission medications   Medication Sig Start Date End Date Taking? Authorizing  Provider  docusate sodium (COLACE) 100 MG capsule Take 100 mg by mouth See admin instructions. Hs x 10 days   Yes [provider]  furosemide (LASIX) 20 MG tablet Take 1 tablet (20 mg total) by mouth 2 (two) times daily. 07/14/21 09/28/21 Yes Allwardt, Randa Evens, PA-C  HYDROcodone-acetaminophen (NORCO) 10-325 MG tablet Take 1 tablet by mouth every 6 (six) hours as needed for up to 5 days for severe pain. Patient taking differently: Take 0.5 tablets by mouth every 6 (six)  hours as needed for severe pain. 09/24/21 09/29/21 Yes Candiss Norse A, PA-C  ibuprofen (ADVIL) 200 MG tablet Take 400 mg by mouth every 6 (six) hours as needed for headache or moderate pain.   Yes [provider]  JAKAFI 10 MG tablet Take 10 mg by mouth 2 (two) times daily. 06/11/21  Yes [provider]  methylPREDNISolone (MEDROL DOSEPAK) 4 MG TBPK tablet 6 day taper dose. Patient taking differently: Take 4 mg by mouth See admin instructions. 8,9,3,8,1,0 09/20/21  Yes Jacqualine Mau, NP  moxifloxacin (AVELOX) 400 MG tablet Take 1 tablet (400 mg total) by mouth daily at 8 pm for 12 days. Patient taking differently: Take 400 mg by mouth See admin instructions. At 8 pm x 12 days 09/20/21 10/02/21 Yes Jacqualine Mau, NP  Multiple Vitamin (MULTI VITAMIN PO) Take 1 tablet by mouth daily.   Yes [provider]  promethazine (PHENERGAN) 12.5 MG tablet Take 12.5 mg by mouth every 6 (six) hours as needed for nausea or vomiting.   Yes [provider]  rifaximin (XIFAXAN) 550 MG TABS tablet Take 550 mg by mouth 2 (two) times daily.   Yes [provider]  lactulose (CHRONULAC) 10 GM/15ML solution Take 45 ml daily Patient not taking: Reported on 09/28/2021 09/05/21   Alfredia Ferguson, PA-C    Physical Exam: Vitals:   09/29/21 0110 09/29/21 0140 09/29/21 0245 09/29/21 0307  BP: (!) 116/53 (!) 118/57 (!) 102/53 (!) 110/55  Pulse: 91 87 80 82  Resp: 14 14 11 17   Temp: 97.7 F (36.5 C) 97.9 F (36.6 C)  98.2 F (36.8 C)  TempSrc: Oral Oral  Axillary  SpO2: 96% 96% 93% 100%  Weight:      Height:        Physical Exam Constitutional:      General: She is not in acute distress. HENT:     Head: Normocephalic and atraumatic.  Eyes:     Extraocular Movements: Extraocular movements intact.     Conjunctiva/sclera: Conjunctivae normal.  Cardiovascular:     Rate and Rhythm: Normal rate and regular rhythm.     Pulses: Normal pulses.   Pulmonary:     Effort: Pulmonary effort is normal. No respiratory distress.     Breath sounds: Normal breath sounds. No wheezing or rales.  Abdominal:     General: Bowel sounds are normal. There is no distension.     Palpations: Abdomen is soft.     Tenderness: There is abdominal tenderness. There is no guarding or rebound.     Comments: Hyperactive bowel sounds Generalized tenderness to palpation  Musculoskeletal:     Cervical back: Normal range of motion and neck supple.     Comments: Mild bilateral pedal edema  Skin:    General: Skin is warm and dry.  Neurological:     General: No focal deficit present.     Mental Status: She is alert and oriented to person, place, and time.  Labs on Admission: I have personally reviewed following labs and imaging studies  CBC: Recent Labs  Lab 09/28/21 1933  WBC 30.5*  NEUTROABS 24.7*  HGB 4.0*  HCT 13.6*  MCV 97.1  PLT 0,539*   Basic Metabolic Panel: Recent Labs  Lab 09/28/21 1933  NA 140  K 4.3  CL 111  CO2 21*  GLUCOSE 112*  BUN 18  CREATININE 0.67  CALCIUM 7.9*   GFR: Estimated Creatinine Clearance: 69.4 mL/min (by C-G formula based on SCr of 0.67 mg/dL). Liver Function Tests: Recent Labs  Lab 09/28/21 1933  AST 28  ALT 16  ALKPHOS 83  BILITOT 2.0*  PROT 4.0*  ALBUMIN 2.4*   Recent Labs  Lab 09/28/21 1933  LIPASE 22   No results for input(s): AMMONIA in the last 168 hours. Coagulation Profile: No results for input(s): INR, PROTIME in the last 168 hours. Cardiac Enzymes: No results for input(s): CKTOTAL, CKMB, CKMBINDEX, TROPONINI in the last 168 hours. BNP (last 3 results) No results for input(s): PROBNP in the last 8760 hours. HbA1C: No results for input(s): HGBA1C in the last 72 hours. CBG: No results for input(s): GLUCAP in the last 168 hours. Lipid Profile: No results for input(s): CHOL, HDL, LDLCALC, TRIG, CHOLHDL, LDLDIRECT in the last 72 hours. Thyroid Function Tests: No results for  input(s): TSH, T4TOTAL, FREET4, T3FREE, THYROIDAB in the last 72 hours. Anemia Panel: No results for input(s): VITAMINB12, FOLATE, FERRITIN, TIBC, IRON, RETICCTPCT in the last 72 hours. Urine analysis:    Component Value Date/Time   COLORURINE YELLOW 07/02/2021 1642   APPEARANCEUR CLEAR 07/02/2021 1642   LABSPEC 1.011 07/02/2021 1642   PHURINE 7.0 07/02/2021 1642   GLUCOSEU NEGATIVE 07/02/2021 1642   HGBUR NEGATIVE 07/02/2021 1642   BILIRUBINUR neg 08/26/2021 0950   KETONESUR NEGATIVE 07/02/2021 1642   PROTEINUR Negative 08/26/2021 0950   PROTEINUR NEGATIVE 07/02/2021 1642   UROBILINOGEN 1.0 (A) 08/26/2021 0950   NITRITE neg 08/26/2021 0950   NITRITE NEGATIVE 07/02/2021 1642   LEUKOCYTESUR Negative 08/26/2021 0950   LEUKOCYTESUR NEGATIVE 07/02/2021 1642    Radiological Exams on Admission: CT Angio Abd/Pel W and/or Wo Contrast  Result Date: 09/28/2021 CLINICAL DATA:  Splenic artery dissection suspected Need triple-phase study per IR request - hx of splenic artery embolization here with anemia, evaluation for active bleeding EXAM: CTA ABDOMEN AND PELVIS WITHOUT AND WITH CONTRAST TECHNIQUE: Multidetector CT imaging of the abdomen and pelvis was performed using the standard protocol during bolus administration of intravenous contrast. Multiplanar reconstructed images and MIPs were obtained and reviewed to evaluate the vascular anatomy. CONTRAST:  126mL OMNIPAQUE IOHEXOL 350 MG/ML SOLN COMPARISON:  CT abdomen pelvis 08/26/2021, IR embolization 09/18/2021 FINDINGS: VASCULAR No active extravasation of intravenous contrast. Aorta: Mild atherosclerotic plaque. Normal caliber aorta without aneurysm, dissection, vasculitis or significant stenosis. Celiac: Patent without evidence of aneurysm, dissection, vasculitis or significant stenosis. The splenic artery/splenic hilar vessel are not visualized due to marked streak artifact from the splenic vasculature coiling. SMA: Patent without evidence of  aneurysm, dissection, vasculitis or significant stenosis. Renals: Both renal arteries are patent without evidence of aneurysm, dissection, vasculitis, fibromuscular dysplasia or significant stenosis. IMA: Patent without evidence of aneurysm, dissection, vasculitis or significant stenosis. Inflow: Patent without evidence of aneurysm, dissection, vasculitis or significant stenosis. Proximal Outflow: Bilateral common femoral and visualized portions of the superficial and profunda femoral arteries are patent without evidence of aneurysm, dissection, vasculitis or significant stenosis. Veins: Marked left upper quadrant venous collaterals. The hepatic veins are patent.  Chronic portal vein occlusion. Review of the MIP images confirms the above findings. NON-VASCULAR Lower chest: No artery calcifications.  No acute abnormality. Hepatobiliary: Redemonstration of right hepatic lobe hemangioma. No new focal liver abnormality. Gallbladder sludge versus gallstone within the gallbladder lumen. No gallbladder wall thickening or pericholecystic fluid. No biliary dilatation. Pancreas: No focal lesion. Normal pancreatic contour. No surrounding inflammatory changes. No main pancreatic ductal dilatation. 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 No definite active hemorrhage of the splenic parenchyma noted. Adrenals/Urinary Tract: No adrenal nodule bilaterally. Bilateral kidneys enhance symmetrically. No hydronephrosis. No hydroureter. The urinary bladder is unremarkable. Stomach/Bowel: Stomach is within normal limits. No evidence of bowel wall thickening or dilatation. Appendix appears normal. Lymphatic: No lymphadenopathy Reproductive: Intramural pericentimeter uterine fibroid. Uterus and bilateral adnexa are unremarkable. Other: Trace simple free fluid within the pelvis. No intraperitoneal free gas. No  organized fluid collection. Musculoskeletal: Subcutaneus soft tissue edema more prominent along the upper flanks. No large hematoma formation. No suspicious lytic or blastic osseous lesions. No acute displaced fracture. IMPRESSION: VASCULAR 1. Markedly limited evaluation of the splenic hilum vasculature due to marked metallic artifact originating from the vascular coiling. No pneumoperitoneum suggest active intraperitoneal bleed. 2. No acute vascular abnormality. 3. 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 NON-VASCULAR 1. Interval development of descending colitis. No CT evidence of definite gastrointestinal bleed. 2. 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 No findings to suggest definite active splenic hemorrhage. 3. Gallbladder sludge versus gallstone within the gallbladder lumen. 4. Intramural uterine fibroid. 5. Trace nonspecific simple free fluid within the pelvis. These results were called by telephone at the time of interpretation on 09/28/2021 at 11:15 pm to provider Dr. Almyra Free, who verbally acknowledged these results. Electronically Signed   By: Iven Finn M.D.   On: 09/28/2021 23:23    Assessment/Plan Principal Problem:   Colitis Active Problems:   Myelofibrosis (HCC)   Leukocytosis   Anemia   Thrombocytosis   ?Colitis -Patient underwent catheter embolization of splenic artery aneurysm on 09/18/2021.   -Presents with complaints of generalized abdominal pain, worse in the left upper quadrant associated with nausea.  No diarrhea.  Has severe acute on chronic anemia but CTA abdomen/pelvis negative for hemoperitoneum or active bleeding.  Showing interval development of descending colitis; no active GI bleed.  Infarcted spleen with interval increase in splenomegaly; no findings to suggest definite active splenic hemorrhage.  -Significant leukocytosis on labs.  No fever or signs of  sepsis.  Start ceftriaxone and metronidazole.  Order blood culture x2.  Check procalcitonin and lactic acid levels.  FOBT.  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  Labs done 9 days ago showing WBC 24.2, hemoglobin 8.6, platelet count 329k. CTA abdomen/pelvis negative for hemoperitoneum or active bleeding. -2 units PRBCs ordered in the ED, order additional 1 unit PRBCs. Follow-up posttransfusion H&H, transfuse additional PRBCs if hemoglobin is less than 7. -Peripheral blood smear, repeat CBC with differential -Hematology consulted  DVT prophylaxis: SCDs Code Status: Full code-discussed with the patient Family Communication: No family available at this time Disposition Plan: Status is: Observation  The patient remains OBS appropriate and will d/c before 2 midnights.  Level of care: Level of care: Telemetry Medical  The medical decision making on this patient was of high complexity and the patient is at high risk for clinical deterioration, therefore this is a level 3 visit.  Shela Leff MD [facility]  If 7PM-7AM, please contact night-coverage [email or web address]  09/29/2021, 3:50 AM

## 2021-09-29 NOTE — Consult Note (Addendum)
Attending physician's note   I have taken an interval history, reviewed the chart and examined the patient. I agree with the Advanced Practitioner's note, impression, and recommendations as outlined.   70 year old female with medical history as outlined below, to include history of cryptogenic cirrhosis (c/b esophageal varices, splenomegaly, hepatic encephalopathy), chronic portal vein thrombosis, JAK2 mutation, myelofibrosis, PUD.  Additionally, recently diagnosed with splenic artery aneurysm which was treated with IR ablation on 09/18/2021.  She presents today with abdominal pain along with dark stools.  Stools have returned to normal color over the last 2 days or so.  Her abdominal pain has actually been present for the last 10 weeks or so, and was the reason for CT on 10/25 which demonstrated the saccular aneurysm of the splenic artery.  MELD on 11/2 was 17 (but has historically elevated indirect bilirubin)  Preoperative labs from 11/17 n/f the following: - WBC 7.2 - H/H 10.3/34.7 - PLT 258 - INR 1.7 - Normal BMP  Labs from 11/17 and 11/18: - WBC 31 --> 24 - H/H 9.7/30 --> 8.6/28 - PLT 377 --> 329  Admission labs notable for the following: - WBC 30.5 - H/H 4/13.6 - PLT 1026 - Normal BUN/creatinine, lipase, PCT, lactate - T bili 2.0,Dbili 0.4 - CT angiography: No active bleed/extravasation, descending colitis, infarcted spleen with increased splenomegaly, right hepatic and angioma, sludge vs stones without GB wall thickening or duct dilation.  No splenic hemorrhage  Transfused 3 unit PRBCs  1) Abdominal pain - Difficult to parse out which of her pain is new vs somewhat chronic in nature.  She was seen in the GI clinic at the end of October for 6+ weeks of pain at that time, and was the indication for CT imaging that then diagnosed the splenic artery aneurysm.  Unsure if the descending: Thickening on this admission CT is related or incidental. - Pain control per primary  service - If endoscopic evaluation and IR/Hematology evaluations all unrevealing, possibly HIDA scan?  2) Splenic artery aneurysm s/p IR embolization 11/17 3) Leukocytosis 4) Acute on chronic Anemia 5) Myelofibrosis - Agree with IR and Hematology consultation - Plan for EGD and flexible sigmoidoscopy tomorrow for diagnostic and potentially therapeutic intent - Started on empiric ABX - Continue serial H/H checks with additional blood products as needed per protocol - Timing to restart Jakafi per Hematology service  6) Cryptogenic Cirrhosis 7) Esophageal varices 8) Hepatic encephalopathy 9) Splenomegaly 10) Dark stools - EGD tomorrow to evaluate for esophageal varices, portal hypertensive gastropathy - Continue rifaximin/lactulose that were recently started  The indications, risks, and benefits of EGD and flexible sigmoidoscopy were explained to the patient in detail. Risks include but are not limited to bleeding, perforation, adverse reaction to medications, and cardiopulmonary compromise. Sequelae include but are not limited to the possibility of surgery, hospitalization, and mortality. The patient verbalized understanding and wished to proceed. All questions answered, referred to scheduler. Further recommendations pending results of the exam.     Gerrit Heck, DO, FACG [phone] office         Consultation  Referring Provider: Meredith Mody MD Primary Care Physician:  Allwardt, Randa Evens, PA-C Primary Gastroenterologist:  Dr.Pyrtle  Reason for Consultation: Anemia, abdominal pain  HPI: Courtney Buckley is a 70 y.o. female, known to Dr. Hilarie Fredrickson with history of cryptogenic cirrhosis, chronic portal vein thrombosis, prior history of peptic ulcer disease, and a myelodysplastic syndrome/myelofibrosis for which she has been on Jakafi. Patient was seen in the  office on 09/03/2021 with complaints of 6-week history of abdominal pain, intense at times and centered in the mid  abdomen, usually worse at night..  CT of the abdomen and pelvis had been done on 08/26/2021 which did show a 2.9 x 3.3 x 3 cm saccular aneurysm of the distal splenic artery without appreciable thrombus and chronic portal vein occlusion with extensive venous collateralization, small gallstones and a previously characterized hemangioma measuring 13 mm in the right liver. She was then set up for IR consultation and ultimately underwent catheter embolization of the aneurysm on 09/18/2021. Patient relates that she did have pain postprocedure which she was told to expect.  Within a day of having the procedure she was also having diarrhea and describes this is being very dark in color and lasting for 2 to 3 daysSubsided.  She is now having more normal bowel movements with no obvious blood and brown in color.  Her appetite overall has been poor, she had some improvement in the abdominal pain which then abruptly worsened about 48 hours ago and has been severe since.  Nausea without vomiting, no fever or chills.  She complains of pain fairly diffusely in her abdomen.   ER evaluation last evening with CT angio showing a descending colitis, and infarction of the spleen of approximately 75% of the splenic parenchyma.  Also with interval increase in size of the enlarged spleen now measuring up to 18.5 cm, no definite active hemorrhage of the splenic parenchyma noted.  Small gallstones versus sludge, stomach within normal limits, no free fluid in the pelvis.  .  Show WBC of 30.5, hemoglobin 4, platelets 1026 LFTs normal with exception of T bili of 2 Lactate within normal limits, procalcitonin 0.13  Hemoglobin up to 7.8 post 2 units of packed RBCs-WBC remains elevated at 31.1  She has been started on ceftriaxone and metronidazole.  She denies taking any NSAIDs over the past several days, has been using Tylenol and took perhaps 1 Advil.  She had a prescription for hydrocodone which was making her feel nauseated and  dizzy.  Last EGD September 2021 with grade 2 varices in the lower one third of the esophagus and 4 mm AVM in the gastric body Colonoscopy September 2020 with 1 5 mm polyp, and internal hemorrhoids otherwise negative.   Past Medical History:  Diagnosis Date  . Cancer [facility])    Bone Marrow Cancer  . Cirrhosis ([place])   . Colon polyps   . Depression   . Esophageal varices ([place])   . Gastritis   . Internal hemorrhoids   . Migraine   . Portal hypertensive gastropathy ([place])   . PVT (portal vein thrombosis)     Past Surgical History:  Procedure Laterality Date  . IR ANGIOGRAM SELECTIVE EACH ADDITIONAL VESSEL  09/18/2021  . IR ANGIOGRAM VISCERAL SELECTIVE  09/18/2021  . IR EMBO ARTERIAL NOT HEMORR HEMANG INC GUIDE ROADMAPPING  09/18/2021  . IR RADIOLOGIST EVAL & MGMT  09/09/2021  . IR US GUIDE VASC ACCESS RIGHT  09/18/2021  . LIVER BIOPSY    . RADIOLOGY WITH ANESTHESIA N/A 09/18/2021   Procedure: IR WITH ANESTHESIA SPLENIC ARTERY EMBOLIZATION;  Surgeon: Criselda Peaches, MD;  Location: [facility];  Service: Radiology;  Laterality: N/A;    Prior to Admission medications   Medication Sig Start Date End Date Taking? Authorizing Provider  docusate sodium (COLACE) 100 MG capsule Take 100 mg by mouth See admin instructions. Hs x 10 days   Yes [provider]  furosemide (LASIX) 20 MG tablet Take 1 tablet (20 mg total) by mouth 2 (two) times daily. 07/14/21 09/28/21 Yes Allwardt, Randa Evens, PA-C  HYDROcodone-acetaminophen (NORCO) 10-325 MG tablet Take 1 tablet by mouth every 6 (six) hours as needed for up to 5 days for severe pain. Patient taking differently: Take 0.5 tablets by mouth every 6 (six) hours as needed for severe pain. 09/24/21 09/29/21 Yes Candiss Norse A, PA-C  ibuprofen (ADVIL) 200 MG tablet Take 400 mg by mouth every 6 (six) hours as needed for headache or moderate pain.   Yes [provider]  JAKAFI 10 MG tablet Take 10 mg by mouth 2 (two) times daily. 06/11/21   Yes [provider]  methylPREDNISolone (MEDROL DOSEPAK) 4 MG TBPK tablet 6 day taper dose. Patient taking differently: Take 4 mg by mouth See admin instructions. 1,5,4,0,0,8 09/20/21  Yes Jacqualine Mau, NP  moxifloxacin (AVELOX) 400 MG tablet Take 1 tablet (400 mg total) by mouth daily at 8 pm for 12 days. Patient taking differently: Take 400 mg by mouth See admin instructions. At 8 pm x 12 days 09/20/21 10/02/21 Yes Jacqualine Mau, NP  Multiple Vitamin (MULTI VITAMIN PO) Take 1 tablet by mouth daily.   Yes [provider]  promethazine (PHENERGAN) 12.5 MG tablet Take 12.5 mg by mouth every 6 (six) hours as needed for nausea or vomiting.   Yes [provider]  rifaximin (XIFAXAN) 550 MG TABS tablet Take 550 mg by mouth 2 (two) times daily.   Yes [provider]  lactulose (CHRONULAC) 10 GM/15ML solution Take 45 ml daily Patient not taking: Reported on 09/28/2021 09/05/21   Alfredia Ferguson, PA-C    Current Facility-Administered Medications  Medication Dose Route Frequency Provider Last Rate Last Admin  . 0.9 %  sodium chloride infusion (Manually program via Guardrails IV Fluids)   Intravenous Once Shela Leff, MD      . 0.9 %  sodium chloride infusion  10 mL/hr Intravenous Once Shela Leff, MD   Held at 09/28/21 2055  . acetaminophen (TYLENOL) tablet 650 mg  650 mg Oral Q6H PRN Shela Leff, MD       Or  . acetaminophen (TYLENOL) suppository 650 mg  650 mg Rectal Q6H PRN Shela Leff, MD   650 mg at 09/29/21 0813  . cefTRIAXone (ROCEPHIN) 1 g in sodium chloride 0.9 % 100 mL IVPB  1 g Intravenous Q0600 Shela Leff, MD   Stopped at 09/29/21 [phone]  . metroNIDAZOLE (FLAGYL) IVPB 500 mg  500 mg Intravenous Quay Burow, MD   Stopped at 09/29/21 [phone]  . morphine 2 MG/ML injection 1 mg  1 mg Intravenous Q4H PRN Shela Leff, MD   1 mg at 09/29/21 1255  . ondansetron (ZOFRAN) injection 4 mg  4 mg  Intravenous Q6H PRN Shela Leff, MD   4 mg at 09/29/21 1301   Current Outpatient Medications  Medication Sig Dispense Refill  . docusate sodium (COLACE) 100 MG capsule Take 100 mg by mouth See admin instructions. Hs x 10 days    . furosemide (LASIX) 20 MG tablet Take 1 tablet (20 mg total) by mouth 2 (two) times daily. 60 tablet 0  . HYDROcodone-acetaminophen (NORCO) 10-325 MG tablet Take 1 tablet by mouth every 6 (six) hours as needed for up to 5 days for severe pain. (Patient taking differently: Take 0.5 tablets by mouth every 6 (six) hours as needed for severe pain.) 20 tablet 0  . ibuprofen (ADVIL) 200 MG  tablet Take 400 mg by mouth every 6 (six) hours as needed for headache or moderate pain.    Marland Kitchen JAKAFI 10 MG tablet Take 10 mg by mouth 2 (two) times daily.    . methylPREDNISolone (MEDROL DOSEPAK) 4 MG TBPK tablet 6 day taper dose. (Patient taking differently: Take 4 mg by mouth See admin instructions. 6,5,4,3,2,1) 21 tablet 0  . moxifloxacin (AVELOX) 400 MG tablet Take 1 tablet (400 mg total) by mouth daily at 8 pm for 12 days. (Patient taking differently: Take 400 mg by mouth See admin instructions. At 8 pm x 12 days) 12 tablet 0  . Multiple Vitamin (MULTI VITAMIN PO) Take 1 tablet by mouth daily.    . promethazine (PHENERGAN) 12.5 MG tablet Take 12.5 mg by mouth every 6 (six) hours as needed for nausea or vomiting.    . rifaximin (XIFAXAN) 550 MG TABS tablet Take 550 mg by mouth 2 (two) times daily.    Marland Kitchen lactulose (CHRONULAC) 10 GM/15ML solution Take 45 ml daily (Patient not taking: Reported on 09/28/2021) 946 mL 11    Allergies as of 09/28/2021  . (No Known Allergies)    Family History  Problem Relation Age of Onset  . Cancer - Lung Father   . Diabetes Mellitus II Maternal Aunt   . Stomach cancer Maternal Aunt   . Diabetes Mellitus II Maternal Uncle   . Colon cancer Neg Hx   . Rectal cancer Neg Hx   . Esophageal cancer Neg Hx   . Liver disease Neg Hx   . Pancreatic  cancer Neg Hx     Social History   Socioeconomic History  . Marital status: Significant Other    Spouse name: Not on file  . Number of children: 2  . Years of education: Not on file  . Highest education level: Not on file  Occupational History  . Not on file  Tobacco Use  . Smoking status: Former    Types: Cigarettes  . Smokeless tobacco: Never  Vaping Use  . Vaping Use: Never used  Substance and Sexual Activity  . Alcohol use: Not Currently    Comment: Occasionally-1 acoholic beverage every 1-2 weeks  . Drug use: No  . Sexual activity: Not on file  Other Topics Concern  . Not on file  Social History Narrative  . Not on file   Social Determinants of Health   Financial Resource Strain: Low Risk   . Difficulty of Paying Living Expenses: Not hard at all  Food Insecurity: No Food Insecurity  . Worried About Charity fundraiser in the Last Year: Never true  . Ran Out of Food in the Last Year: Never true  Transportation Needs: No Transportation Needs  . Lack of Transportation (Medical): No  . Lack of Transportation (Non-Medical): No  Physical Activity: Insufficiently Active  . Days of Exercise per Week: 3 days  . Minutes of Exercise per Session: 20 min  Stress: No Stress Concern Present  . Feeling of Stress : Only a little  Social Connections: Moderately Isolated  . Frequency of Communication with Friends and Family: More than three times a week  . Frequency of Social Gatherings with Friends and Family: More than three times a week  . Attends Religious Services: Never  . Active Member of Clubs or Organizations: No  . Attends Archivist Meetings: Never  . Marital Status: Married  Human resources officer Violence: Not At Risk  . Fear of Current or Ex-Partner: No  .  Emotionally Abused: No  . Physically Abused: No  . Sexually Abused: No    Review of Systems: Pertinent positive and negative review of systems were noted in the above HPI section.  All other review of  systems was otherwise negative.   Physical Exam: Vital signs in last 24 hours: Temp:  [97.7 F (36.5 C)-98.2 F (36.8 C)] 97.7 F (36.5 C) (11/28 0817) Pulse Rate:  [80-93] 92 (11/28 1301) Resp:  [11-23] 19 (11/28 1301) BP: (97-131)/(48-92) 121/59 (11/28 1301) SpO2:  [91 %-100 %] 95 % (11/28 1301) Weight:  [81.6 kg] 81.6 kg (11/27 1812)   General:   Alert,  Well-developed, well-nourished, very uncomfortable appearing older white female cooperative in NAD.  Stationed in the hallway in the ER, husband at bedside Head:  Normocephalic and atraumatic. Eyes:  Sclera clear, no icterus.   Conjunctiva pale Ears:  Normal auditory acuity. Nose:  No deformity, discharge,  or lesions. Mouth:  No deformity or lesions.   Neck:  Supple; no masses or thyromegaly. Lungs:  Clear throughout to auscultation.   No wheezes, crackles, or rhonchi.  Heart:  Regular rate and rhythm; no murmurs, clicks, rubs,  or gallops. Abdomen:  Soft, she is diffusely tender throughout the abdomen, no rebound bowel sounds present but decreased Rectal: Not done, documented heme positive Msk:  Symmetrical without gross deformities. . Pulses:  Normal pulses noted. Extremities:  Without clubbing or edema. Neurologic:  Alert and  oriented x4;  grossly normal neurologically. Skin:  Intact without significant lesions or rashes.. Psych:  Alert and cooperative. Normal mood and affect.  Intake/Output from previous day: 11/27 0701 - 11/28 0700 In: 1414 [Blood:315; IV Piggyback:1099] Out: -  Intake/Output this shift: No intake/output data recorded.  Lab Results: Recent Labs    09/28/21 1933 09/29/21 0939  WBC 30.5* 31.1*  HGB 4.0* 7.8*  HCT 13.6* 23.8*  PLT 1,026* 838*   BMET Recent Labs    09/28/21 1933  NA 140  K 4.3  CL 111  CO2 21*  GLUCOSE 112*  BUN 18  CREATININE 0.67  CALCIUM 7.9*   LFT Recent Labs    09/28/21 1933  PROT 4.0*  ALBUMIN 2.4*  AST 28  ALT 16  ALKPHOS 83  BILITOT 2.0*  BILIDIR  0.4*  IBILI 1.6*   PT/INR No results for input(s): LABPROT, INR in the last 72 hours. Hepatitis Panel No results for input(s): HEPBSAG, HCVAB, HEPAIGM, HEPBIGM in the last 72 hours.    IMPRESSION:  #28 70 year old white female with history of cryptogenic cirrhosis complicated by chronic portal vein thrombosis, esophageal varices, splenomegaly, who had presented about a month ago with abdominal pain x6 weeks, varying in intensity Found on CT imaging to have an enlarging splenic artery aneurysm. Patient underwent IR catheter embolization on 09/18/2021  Patient developed dark stools/melena within 24 hours of procedure by her report which lasted for couple of days and then resolved She had pain immediately postprocedure which improved after a couple of days though never resolved and then abruptly worsened about 48 hours ago and has been severe.  Found to have significant leukocytosis with WBC of 31,000, hemoglobin of 4. CT angio showed a descending colitis, infarcted spleen with infarction of up to 75% of the splenic parenchyma and increase in splenic size No evidence for active bleeding and no evidence for splenic bleed by imaging,  Etiology of the above constellation of signs and symptoms is not entirely clear. Certainly splenic infarction and/or progressive infarction may explain her pain  and leukocytosis. Questioning if she is sequestering blood in the spleen  Etiology of the melena also not entirely clear, this may be explained by a descending colitis-question ischemic colitis secondary to embolization procedure or subsequent relative hypotension  She is not actively bleeding today, doubt any recent variceal bleed. Cannot rule out peptic ulcer disease.  #2 small gallstones versus sludge #3 myelofibrosis-treated with Jakafi 4 history of esophageal varices-grade 2 varices documented on EGD September 2021, no portal gastropathy had 1 4 mm AVM in the gastric body  Plan; clear liquid  diet, n.p.o. after midnight IV PPI twice daily Serial hemoglobins and transfuse to keep hemoglobin in the 7-8 range Hematology consultation I have asked IR to consult as well Pain control-morphine is not working, Switch to Dilaudid Continue IV antibiotics Patient has been scheduled for EGD and flexible sigmoidoscopy with Dr. Bryan Lemma tomorrow morning.  Both procedures were discussed with the patient including indications risks and benefits and she is agreeable to proceed.  GI will continue to follow.     Amy Esterwood PA-C 09/29/2021, 1:21 PM

## 2021-09-29 NOTE — Consult Note (Signed)
[place] [phone]) [phone]   Fax:([phone]) [place] NOTE  Patient Care Team: Allwardt, Randa Evens, PA-C as PCP - General (Physician Assistant)  Hematological/Oncological History # Myelofibrosis, JAK2 positive  09/11/2021: last visit with Dr. Joan Mayans at [facility]. Was taking ruxolitinib 10mg  BID.  09/18/2021: IR embolization of splenic artery.  09/28/2021: Presented to the emergency department with worsening abdominal pain.  Hemoglobin measured at 4.0, white blood cell count 30.5, and platelets of 1026.  CT abdomen was performed which showed interval development of descending colitis with no CT evidence of definitive gastrointestinal bleed.  Infarcted spleen was noted with increasing splenomegaly to 18 cm from prior 14 cm.  No findings to suggest definite active splenic hemorrhage. 09/29/2021: hematology consulted.   CHIEF COMPLAINTS/PURPOSE OF CONSULTATION:  "Thrombocytosis/Anemia in Setting of Myelofibrosis/Recent Splenic Artery Embolization "  HISTORY OF PRESENTING ILLNESS:  Courtney Buckley 70 y.o. female with medical history significant for myelofibrosis currently followed by Dr. Joan Mayans at [facility] who presents with abdominal pain following a splenic artery embolization and was found to have marked worsening anemia and new thrombocytosis.  On review of the previous records Courtney Buckley is followed by Dr. Joan Mayans at [facility].  She has been taking Jakofi 10 mg p.o. twice daily.  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  As result of this she underwent IR embolization of splenic artery on 09/18/2021.  She did have pain immediately after the procedure which was thought to be normal postprocedural discomfort.  On 09/29/2019 the patient presented to the emergency department with abdominal pain. Hemoglobin measured at  4.0, white blood cell count 30.5, and platelets of 1026.  CT abdomen was performed which showed interval development of descending colitis with no CT evidence of definitive gastrointestinal bleed.  Infarcted spleen was noted with increasing splenomegaly to 18 cm from prior 14 cm.  No findings to suggest definite active splenic hemorrhage.  Due to concern for these findings hematology was consulted for further evaluation and management.  On exam today Courtney Buckley endorses having had dark stools shortly after the splenic artery embolization was performed.  She notes that they were pitched black.  She has not been having those stools recently and denies any overt signs or symptoms of bleeding.  She notes that she has been tolerating her Jakafi medication well.  She has a chronic abdominal pain which has been persistent despite the splenic artery embolization.  She has a new pain on top of that and reports that it is currently well controlled with Dilaudid.  She currently denies any fevers, chills, sweats, nausea, vomiting or diarrhea.  A full 10 point ROS is listed below.  MEDICAL HISTORY:  Past Medical History:  Diagnosis Date   Cancer ([place])    Bone Marrow Cancer   Cirrhosis ([place])    Colon polyps    Depression    Esophageal varices ([facility])    Gastritis    Internal hemorrhoids    Migraine    Portal hypertensive gastropathy ([facility])    PVT (portal vein thrombosis)     SURGICAL HISTORY: Past Surgical History:  Procedure Laterality Date   IR ANGIOGRAM SELECTIVE EACH ADDITIONAL VESSEL  09/18/2021   IR ANGIOGRAM VISCERAL SELECTIVE  09/18/2021   IR EMBO ARTERIAL NOT HEMORR HEMANG INC GUIDE ROADMAPPING  09/18/2021   IR RADIOLOGIST EVAL & MGMT  09/09/2021  IR US GUIDE VASC ACCESS RIGHT  09/18/2021   LIVER BIOPSY     RADIOLOGY WITH ANESTHESIA N/A 09/18/2021   Procedure: IR WITH ANESTHESIA SPLENIC ARTERY EMBOLIZATION;  Surgeon: Criselda Peaches, MD;  Location: [place];  Service: Radiology;   Laterality: N/A;    SOCIAL HISTORY: Social History   Socioeconomic History   Marital status: Significant Other    Spouse name: Not on file   Number of children: 2   Years of education: Not on file   Highest education level: Not on file  Occupational History   Not on file  Tobacco Use   Smoking status: Former    Types: Cigarettes   Smokeless tobacco: Never  Vaping Use   Vaping Use: Never used  Substance and Sexual Activity   Alcohol use: Not Currently    Comment: Occasionally-1 acoholic beverage every 1-2 weeks   Drug use: No   Sexual activity: Not on file  Other Topics Concern   Not on file  Social History Narrative   Not on file   Social Determinants of Health   Financial Resource Strain: Low Risk    Difficulty of Paying Living Expenses: Not hard at all  Food Insecurity: No Food Insecurity   Worried About Charity fundraiser in the Last Year: Never true   [place] in the Last Year: Never true  Transportation Needs: No Transportation Needs   Lack of Transportation (Medical): No   Lack of Transportation (Non-Medical): No  Physical Activity: Insufficiently Active   Days of Exercise per Week: 3 days   Minutes of Exercise per Session: 20 min  Stress: No Stress Concern Present   Feeling of Stress : Only a little  Social Connections: Moderately Isolated   Frequency of Communication with Friends and Family: More than three times a week   Frequency of Social Gatherings with Friends and Family: More than three times a week   Attends Religious Services: Never   Marine scientist or Organizations: No   Attends Music therapist: Never   Marital Status: Married  Human resources officer Violence: Not At Risk   Fear of Current or Ex-Partner: No   Emotionally Abused: No   Physically Abused: No   Sexually Abused: No    FAMILY HISTORY: Family History  Problem Relation Age of Onset   Cancer - Lung Father    Diabetes Mellitus II Maternal Aunt    Stomach  cancer Maternal Aunt    Diabetes Mellitus II Maternal Uncle    Colon cancer Neg Hx    Rectal cancer Neg Hx    Esophageal cancer Neg Hx    Liver disease Neg Hx    Pancreatic cancer Neg Hx     ALLERGIES:  has No Known Allergies.  MEDICATIONS:  Current Facility-Administered Medications  Medication Dose Route Frequency Provider Last Rate Last Admin   0.9 %  sodium chloride infusion (Manually program via Guardrails IV Fluids)   Intravenous Once Shela Leff, MD       0.9 %  sodium chloride infusion  10 mL/hr Intravenous Once Shela Leff, MD   Held at 09/28/21 2055   acetaminophen (TYLENOL) tablet 650 mg  650 mg Oral Q6H PRN Shela Leff, MD   650 mg at 09/29/21 1432   Or   acetaminophen (TYLENOL) suppository 650 mg  650 mg Rectal Q6H PRN Shela Leff, MD   650 mg at 09/29/21 0813   cefTRIAXone (ROCEPHIN) 1 g in sodium chloride 0.9 %  100 mL IVPB  1 g Intravenous Q0600 Shela Leff, MD   Stopped at 09/29/21 [phone]   HYDROmorphone (DILAUDID) injection 1 mg  1 mg Intravenous Q4H PRN Esterwood, Amy S, PA-C   1 mg at 09/29/21 1451   metroNIDAZOLE (FLAGYL) IVPB 500 mg  500 mg Intravenous Q8H Shela Leff, MD 100 mL/hr at 09/29/21 1435 500 mg at 09/29/21 1435   ondansetron (ZOFRAN) injection 4 mg  4 mg Intravenous Q6H PRN Shela Leff, MD   4 mg at 09/29/21 1301   pantoprazole (PROTONIX) injection 40 mg  40 mg Intravenous Q12H Esterwood, Amy S, PA-C       Current Outpatient Medications  Medication Sig Dispense Refill   docusate sodium (COLACE) 100 MG capsule Take 100 mg by mouth See admin instructions. Hs x 10 days     furosemide (LASIX) 20 MG tablet Take 1 tablet (20 mg total) by mouth 2 (two) times daily. 60 tablet 0   HYDROcodone-acetaminophen (NORCO) 10-325 MG tablet Take 1 tablet by mouth every 6 (six) hours as needed for up to 5 days for severe pain. (Patient taking differently: Take 0.5 tablets by mouth every 6 (six) hours as needed for severe pain.) 20  tablet 0   ibuprofen (ADVIL) 200 MG tablet Take 400 mg by mouth every 6 (six) hours as needed for headache or moderate pain.     JAKAFI 10 MG tablet Take 10 mg by mouth 2 (two) times daily.     methylPREDNISolone (MEDROL DOSEPAK) 4 MG TBPK tablet 6 day taper dose. (Patient taking differently: Take 4 mg by mouth See admin instructions. 6,5,4,3,2,1) 21 tablet 0   moxifloxacin (AVELOX) 400 MG tablet Take 1 tablet (400 mg total) by mouth daily at 8 pm for 12 days. (Patient taking differently: Take 400 mg by mouth See admin instructions. At 8 pm x 12 days) 12 tablet 0   Multiple Vitamin (MULTI VITAMIN PO) Take 1 tablet by mouth daily.     promethazine (PHENERGAN) 12.5 MG tablet Take 12.5 mg by mouth every 6 (six) hours as needed for nausea or vomiting.     rifaximin (XIFAXAN) 550 MG TABS tablet Take 550 mg by mouth 2 (two) times daily.     lactulose (CHRONULAC) 10 GM/15ML solution Take 45 ml daily (Patient not taking: Reported on 09/28/2021) 946 mL 11    REVIEW OF SYSTEMS:   Constitutional: ( - ) fevers, ( - )  chills , ( - ) night sweats Eyes: ( - ) blurriness of vision, ( - ) double vision, ( - ) watery eyes Ears, nose, mouth, throat, and face: ( - ) mucositis, ( - ) sore throat Respiratory: ( - ) cough, ( - ) dyspnea, ( - ) wheezes Cardiovascular: ( - ) palpitation, ( - ) chest discomfort, ( - ) lower extremity swelling Gastrointestinal:  ( - ) nausea, ( - ) heartburn, ( - ) change in bowel habits Skin: ( - ) abnormal skin rashes Lymphatics: ( - ) new lymphadenopathy, ( - ) easy bruising Neurological: ( - ) numbness, ( - ) tingling, ( - ) new weaknesses Behavioral/Psych: ( - ) mood change, ( - ) new changes  All other systems were reviewed with the patient and are negative.  PHYSICAL EXAMINATION:  Vitals:   09/29/21 1338 09/29/21 1425  BP: 124/60 (!) 115/53  Pulse: 94 88  Resp: 15 20  Temp:  98.5 F (36.9 C)  SpO2: 96% 94%   Filed Weights   09/28/21 1812  Weight: 180 lb (81.6 kg)     GENERAL: well appearing elderly Caucasian female in NAD  SKIN: skin color, texture, turgor are normal, no rashes or significant lesions EYES: conjunctiva are pink and non-injected, sclera clear LUNGS: clear to auscultation and percussion with normal breathing effort HEART: regular rate & rhythm and no murmurs and no lower extremity edema Musculoskeletal: no cyanosis of digits and no clubbing  PSYCH: alert & oriented x 3, fluent speech NEURO: no focal motor/sensory deficits  LABORATORY DATA:  I have reviewed the data as listed CBC Latest Ref Rng & Units 09/29/2021 09/29/2021 09/28/2021  WBC 4.0 - 10.5 K/uL - 31.1(H) 30.5(H)  Hemoglobin 12.0 - 15.0 g/dL 7.1(L) 7.8(L) 4.0(LL)  Hematocrit 36.0 - 46.0 % 22.9(L) 23.8(L) 13.6(L)  Platelets 150 - 400 K/uL - 838(H) 1,026(HH)    CMP Latest Ref Rng & Units 09/28/2021 09/20/2021 09/19/2021  Glucose 70 - 99 mg/dL 112(H) 150(H) 151(H)  BUN 8 - 23 mg/dL 18 22 10   Creatinine 0.44 - 1.00 mg/dL 0.67 0.77 0.66  Sodium 135 - 145 mmol/L 140 136 139  Potassium 3.5 - 5.1 mmol/L 4.3 4.7 4.1  Chloride 98 - 111 mmol/L 111 109 112(H)  CO2 22 - 32 mmol/L 21(L) 22 22  Calcium 8.9 - 10.3 mg/dL 7.9(L) 8.2(L) 8.2(L)  Total Protein 6.5 - 8.1 g/dL 4.0(L) 4.4(L) -  Total Bilirubin 0.3 - 1.2 mg/dL 2.0(H) 2.1(H) -  Alkaline Phos 38 - 126 U/L 83 66 -  AST 15 - 41 U/L 28 54(H) -  ALT 0 - 44 U/L 16 21 -    RADIOGRAPHIC STUDIES:  IR Angiogram Visceral Selective  Result Date: 09/18/2021 INDICATION: 70 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 She presents for splenic angiogram and embolization of the aneurysm. EXAM: IR EMBO ARTERIAL NOT HEMORR HEMANG INC GUIDE ROADMAPPING; ADDITIONAL ARTERIOGRAPHY; IR ULTRASOUND GUIDANCE VASC ACCESS RIGHT; SELECTIVE VISCERAL ARTERIOGRAPHY MEDICATIONS: 500 mL Levaquin. Additionally, 10 mg Decadron and 30 mg Toradol were administered during the procedure. The antibiotic was administered  within 1 hour of the procedure ANESTHESIA/SEDATION: General anesthesia. CONTRAST:  36mL OMNIPAQUE IOHEXOL 300 MG/ML SOLN, 53mL OMNIPAQUE IOHEXOL 300 MG/ML SOLN FLUOROSCOPY TIME:  Fluoroscopy Time: 37 minutes 6 seconds (4187 mGy). COMPLICATIONS: None immediate. PROCEDURE: Informed consent was obtained from the patient following explanation of the procedure, risks, benefits and alternatives. The patient understands, agrees and consents for the procedure. All questions were addressed. A time out was performed prior to the initiation of the procedure. Maximal barrier sterile technique utilized including caps, mask, sterile gowns, sterile gloves, large sterile drape, hand hygiene, and Betadine prep. The right common femoral artery was interrogated with ultrasound and found to be widely patent. An image was obtained and stored for the medical record. Local anesthesia was attained by infiltration with 1% lidocaine. A small dermatotomy was made. Under real-time sonographic guidance, the vessel was punctured with a 21 gauge micropuncture needle. Using standard technique, the initial micro needle was exchanged over a 0.018 micro wire for a transitional 4 [place] micro sheath. The micro sheath was then exchanged over a 0.035 wire for a 5 French vascular sheath. A C2 cobra catheter was used to select the celiac artery. A celiac arteriogram was performed. Conventional celiac axis anatomy. The approximately 3.2 cm aneurysm of the distal splenic artery was easily identified. A glidewire was advanced more distally into the splenic artery. The C2 cobra catheter was advanced into the mid aspect of the splenic artery. The glidewire was  exchanged for a Rosen wire. The 5 [place] vascular sheath was exchanged for a 6 French 45 cm to rubor destination sheath. This was advanced into the proximal splenic artery. The Cobra catheter was then exchanged for a 5 French glide catheter which was then navigated even more distally into the splenic  artery. One is much distal progression as possible could be made, a lantern microcatheter was introduced over a Fathom 16 wire in used to navigate into the aneurysm sac, and ultimately through the aneurysm sac into the outflow vessels. As seen on prior CTA imaging, the outflow vessel bifurcates after a short distance in provide supply to both the mid and inferior aspect of the spleen. A more proximal a branch arising from the mid splenic artery provides flow to the upper pole of the spleen. Once the microcatheter was successfully navigated into the lower pole branch. Coil embolization of the outflow arteries was performed using a series of penumbra detachable microcoils. Once the outflow arteries had been embolized into the aneurysm sac, the aneurysm sac itself was stabilized by placement of two 36 mm and two 32 mm penumbra framing coils. Once the aneurysm was successfully framed, a 14 mm fibered interlock detachable coil was partially deployed on the back bench and soaked in thrombin solution. The catheter was then re- sheathed and successfully deployed into the center of the framed aneurysm. The inflow artery was then coil embolized using a series of penumbra coils terminating with a 45 cm liquid metal coil. Contrast injection performed at this time confirms no antegrade flow into the aneurysm sac and only retrograde flow in the splenic artery. Therefore, the microcatheter was removed. Injections were performed through the 5 French catheter in the more proximal splenic artery as well as through the 6 French sheath in the origin of the celiac artery. The proximal and mid splenic artery remain patent. The proximal branch to the upper pole is patent with excellent contrast blush of the upper pole of the spleen. Unfortunately, there is no significant reconstitution of more distal splenic branches resulting in absent parenchymal blush in the mid and distal spleen. 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 IMPRESSION: 1. Successful coil embolization of distal splenic artery aneurysm with no evidence of residual flow into the aneurysm sac. 2. This results in devascularization of approximately 2/3 of the splenic volume, predominantly in the mid and lower pole. 3. Persistent parenchymal opacification of the upper pole of the spleen due to a more proximal upper pole branch. PLAN: 1. 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 2. We will manage her aggressively with steroids, NSAIDs and normal caudate pain medications as needed. 3. Monitor signs and symptoms of post embolization syndrome. 4. Intravenous Levaquin while an inpatient. Upon discharge patient will complete a 2 week course of moxifloxacin 500 mg daily Electronically Signed   By: Jacqulynn Cadet M.D.   On: 09/18/2021 19:41   IR Angiogram Selective Each Additional Vessel  Result Date: 09/18/2021 INDICATION: 70 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 She presents for splenic angiogram and embolization of the aneurysm. EXAM: IR EMBO ARTERIAL NOT HEMORR HEMANG INC GUIDE ROADMAPPING; ADDITIONAL ARTERIOGRAPHY; IR ULTRASOUND GUIDANCE VASC ACCESS RIGHT; SELECTIVE VISCERAL ARTERIOGRAPHY MEDICATIONS: 500 mL Levaquin. Additionally, 10 mg Decadron and 30 mg Toradol were administered during the procedure. The antibiotic was administered within 1 hour of the procedure ANESTHESIA/SEDATION: General anesthesia. CONTRAST:  71mL OMNIPAQUE IOHEXOL 300 MG/ML SOLN, 33mL OMNIPAQUE IOHEXOL 300 MG/ML SOLN FLUOROSCOPY TIME:  Fluoroscopy Time: 37 minutes 6 seconds (4187 mGy). COMPLICATIONS: None immediate. PROCEDURE: Informed consent was obtained from the patient following explanation of the procedure, risks, benefits and alternatives. The patient understands, agrees and consents for the procedure. All questions were addressed. A  time out was performed prior to the initiation of the procedure. Maximal barrier sterile technique utilized including caps, mask, sterile gowns, sterile gloves, large sterile drape, hand hygiene, and Betadine prep. The right common femoral artery was interrogated with ultrasound and found to be widely patent. An image was obtained and stored for the medical record. Local anesthesia was attained by infiltration with 1% lidocaine. A small dermatotomy was made. Under real-time sonographic guidance, the vessel was punctured with a 21 gauge micropuncture needle. Using standard technique, the initial micro needle was exchanged over a 0.018 micro wire for a transitional 4 [place] micro sheath. The micro sheath was then exchanged over a 0.035 wire for a 5 French vascular sheath. A C2 cobra catheter was used to select the celiac artery. A celiac arteriogram was performed. Conventional celiac axis anatomy. The approximately 3.2 cm aneurysm of the distal splenic artery was easily identified. A glidewire was advanced more distally into the splenic artery. The C2 cobra catheter was advanced into the mid aspect of the splenic artery. The glidewire was exchanged for a Rosen wire. The 5 [place] vascular sheath was exchanged for a 6 French 45 cm to rubor destination sheath. This was advanced into the proximal splenic artery. The Cobra catheter was then exchanged for a 5 French glide catheter which was then navigated even more distally into the splenic artery. One is much distal progression as possible could be made, a lantern microcatheter was introduced over a Fathom 16 wire in used to navigate into the aneurysm sac, and ultimately through the aneurysm sac into the outflow vessels. As seen on prior CTA imaging, the outflow vessel bifurcates after a short distance in provide supply to both the mid and inferior aspect of the spleen. A more proximal a branch arising from the mid splenic artery provides flow to the upper pole of the  spleen. Once the microcatheter was successfully navigated into the lower pole branch. Coil embolization of the outflow arteries was performed using a series of penumbra detachable microcoils. Once the outflow arteries had been embolized into the aneurysm sac, the aneurysm sac itself was stabilized by placement of two 36 mm and two 32 mm penumbra framing coils. Once the aneurysm was successfully framed, a 14 mm fibered interlock detachable coil was partially deployed on the back bench and soaked in thrombin solution. The catheter was then re- sheathed and successfully deployed into the center of the framed aneurysm. The inflow artery was then coil embolized using a series of penumbra coils terminating with a 45 cm liquid metal coil. Contrast injection performed at this time confirms no antegrade flow into the aneurysm sac and only retrograde flow in the splenic artery. Therefore, the microcatheter was removed. Injections were performed through the 5 French catheter in the more proximal splenic artery as well as through the 6 French sheath in the origin of the celiac artery. The proximal and mid splenic artery remain patent. The proximal branch to the upper pole is patent with excellent contrast blush of the upper pole of the spleen. Unfortunately, there is no significant reconstitution of more distal splenic branches resulting in absent parenchymal blush in the mid and  distal spleen. 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 IMPRESSION: 1. Successful coil embolization of distal splenic artery aneurysm with no evidence of residual flow into the aneurysm sac. 2. This results in devascularization of approximately 2/3 of the splenic volume, predominantly in the mid and lower pole. 3. Persistent parenchymal opacification of the upper pole of the spleen due to a more proximal upper pole branch. PLAN: 1. Admit patient for observation, she may require full admission for several days depending on the  severity of her post embolization syndrome. 2. We will manage her aggressively with steroids, NSAIDs and normal caudate pain medications as needed. 3. Monitor signs and symptoms of post embolization syndrome. 4. Intravenous Levaquin while an inpatient. Upon discharge patient will complete a 2 week course of moxifloxacin 500 mg daily Electronically Signed   By: Jacqulynn Cadet M.D.   On: 09/18/2021 19:41   US ARTERIAL ABI (SCREENING LOWER EXTREMITY)  Result Date: 09/16/2021 CLINICAL DATA:  Family history of popliteal artery aneurysms EXAM: NONINVASIVE PHYSIOLOGIC VASCULAR STUDY OF BILATERAL LOWER EXTREMITIES TECHNIQUE: Evaluation of both lower extremities were performed at rest, including calculation of ankle-brachial indices with single level Doppler, pressure and pulse volume recording. COMPARISON:  None. FINDINGS: Right ABI:  1.2 Left ABI:  1.2 Right Lower Extremity:  Normal arterial waveforms at the ankle. Left Lower Extremity:  Normal arterial waveforms at the ankle. 1.0-1.4 Normal IMPRESSION: Normal bilateral resting ankle-brachial indices. Electronically Signed   By: Jacqulynn Cadet M.D.   On: 09/16/2021 11:50   US ARTERIAL LOWER EXTREMITY DUPLEX BILATERAL  Result Date: 09/16/2021 CLINICAL DATA:  Splenic artery aneurysm with significant family history of intracranial and popliteal aneurysms. Assess for popliteal aneurysmal disease. EXAM: BILATERAL LOWER EXTREMITY ARTERIAL DUPLEX SCAN TECHNIQUE: Gray-scale sonography as well as color Doppler and duplex ultrasound was performed to evaluate the arteries of both lower extremities including the common, superficial and profunda femoral arteries, popliteal artery and calf arteries. COMPARISON:  None. FINDINGS: Right Lower Extremity ABI: 1.2 Inflow: Normal common femoral arterial waveforms and velocities. No evidence of inflow (aortoiliac) disease. Outflow: Normal profunda femoral, superficial femoral and popliteal arterial waveforms and velocities. No  focal elevation of the PSV to suggest stenosis. Runoff: Normal posterior and anterior tibial arterial waveforms and velocities. Vessels are patent to the ankle. Left Lower Extremity ABI: 1.2 Inflow: Normal common femoral arterial waveforms and velocities. No evidence of inflow (aortoiliac) disease. Outflow: Normal profunda femoral, superficial femoral and popliteal arterial waveforms and velocities. No focal elevation of the PSV to suggest stenosis. Runoff: Normal posterior and anterior tibial arterial waveforms and velocities. Vessels are patent to the ankle. IMPRESSION: 1. Negative for evidence of popliteal artery aneurysm. 2. Trace atherosclerotic plaque without stenosis or occlusion. 3. Normal bilateral resting ankle-brachial indices. Signed, Criselda Peaches, MD, [facility] [facility] Radiology Electronically Signed   By: Jacqulynn Cadet M.D.   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 She presents for splenic angiogram and embolization of the aneurysm. EXAM: IR EMBO ARTERIAL NOT HEMORR HEMANG INC GUIDE ROADMAPPING; ADDITIONAL ARTERIOGRAPHY; IR ULTRASOUND GUIDANCE VASC ACCESS RIGHT; SELECTIVE VISCERAL ARTERIOGRAPHY MEDICATIONS: 500 mL Levaquin. Additionally, 10 mg Decadron and 30 mg Toradol were administered during the procedure. The antibiotic was administered within 1 hour of the procedure ANESTHESIA/SEDATION: General anesthesia. CONTRAST:  30mL OMNIPAQUE IOHEXOL 300 MG/ML SOLN, 33mL OMNIPAQUE IOHEXOL 300 MG/ML SOLN  FLUOROSCOPY TIME:  Fluoroscopy Time: 37 minutes 6 seconds (4187 mGy). COMPLICATIONS: None immediate. PROCEDURE: Informed consent was obtained from the patient following explanation of the procedure, risks, benefits and alternatives. The patient understands, agrees and consents for the procedure. All questions  were addressed. A time out was performed prior to the initiation of the procedure. Maximal barrier sterile technique utilized including caps, mask, sterile gowns, sterile gloves, large sterile drape, hand hygiene, and Betadine prep. The right common femoral artery was interrogated with ultrasound and found to be widely patent. An image was obtained and stored for the medical record. Local anesthesia was attained by infiltration with 1% lidocaine. A small dermatotomy was made. Under real-time sonographic guidance, the vessel was punctured with a 21 gauge micropuncture needle. Using standard technique, the initial micro needle was exchanged over a 0.018 micro wire for a transitional 4 [place] micro sheath. The micro sheath was then exchanged over a 0.035 wire for a 5 French vascular sheath. A C2 cobra catheter was used to select the celiac artery. A celiac arteriogram was performed. Conventional celiac axis anatomy. The approximately 3.2 cm aneurysm of the distal splenic artery was easily identified. A glidewire was advanced more distally into the splenic artery. The C2 cobra catheter was advanced into the mid aspect of the splenic artery. The glidewire was exchanged for a Rosen wire. The 5 [place] vascular sheath was exchanged for a 6 French 45 cm to rubor destination sheath. This was advanced into the proximal splenic artery. The Cobra catheter was then exchanged for a 5 French glide catheter which was then navigated even more distally into the splenic artery. One is much distal progression as possible could be made, a lantern microcatheter was introduced over a Fathom 16 wire in used to navigate into the aneurysm sac, and ultimately through the aneurysm sac into the outflow vessels. As seen on prior CTA imaging, the outflow vessel bifurcates after a short distance in provide supply to both the mid and inferior aspect of the spleen. A more proximal a branch arising from the mid splenic artery provides flow to the  upper pole of the spleen. Once the microcatheter was successfully navigated into the lower pole branch. Coil embolization of the outflow arteries was performed using a series of penumbra detachable microcoils. Once the outflow arteries had been embolized into the aneurysm sac, the aneurysm sac itself was stabilized by placement of two 36 mm and two 32 mm penumbra framing coils. Once the aneurysm was successfully framed, a 14 mm fibered interlock detachable coil was partially deployed on the back bench and soaked in thrombin solution. The catheter was then re- sheathed and successfully deployed into the center of the framed aneurysm. The inflow artery was then coil embolized using a series of penumbra coils terminating with a 45 cm liquid metal coil. Contrast injection performed at this time confirms no antegrade flow into the aneurysm sac and only retrograde flow in the splenic artery. Therefore, the microcatheter was removed. Injections were performed through the 5 French catheter in the more proximal splenic artery as well as through the 6 French sheath in the origin of the celiac artery. The proximal and mid splenic artery remain patent. The proximal branch to the upper pole is patent with excellent contrast blush of the upper pole of the spleen. Unfortunately, there is no significant reconstitution of more distal splenic branches resulting in absent parenchymal blush in the mid and distal spleen. 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 IMPRESSION:  1. Successful coil embolization of distal splenic artery aneurysm with no evidence of residual flow into the aneurysm sac. 2. This results in devascularization of approximately 2/3 of the splenic volume, predominantly in the mid and lower pole. 3. Persistent parenchymal opacification of the upper pole of the spleen due to a more proximal upper pole branch. PLAN: 1. 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 2. We will manage her aggressively with steroids, NSAIDs and normal caudate pain medications as needed. 3. Monitor signs and symptoms of post embolization syndrome. 4. Intravenous Levaquin while an inpatient. Upon discharge patient will complete a 2 week course of moxifloxacin 500 mg daily Electronically Signed   By: Jacqulynn Cadet M.D.   On: 09/18/2021 19:41   ECHOCARDIOGRAM COMPLETE  Result Date: 09/02/2021    ECHOCARDIOGRAM REPORT   Patient Name:   Courtney Buckley Date of Exam: 09/02/2021 Medical Rec #:  [id number]           Height:       66.0 in Accession #:    [id number]          Weight:       181.0 lb Date of Birth:  03/29/1951           BSA:          1.917 m Patient Age:    10 years            BP:           102/57 mmHg Patient Gender: F                   HR:           82 bpm. Exam Location:  [facility] Procedure: 2D Echo, 3D Echo, Cardiac Doppler, Color Doppler and Strain Analysis Indications:    I50.31 CHF  History:        Patient has prior history of Echocardiogram examinations, most                 recent 01/23/2021. CHF, Signs/Symptoms:Edema; Risk Factors:Former                 Smoker. Bone Marrow Cancer.  Sonographer:    Deliah Boston RDCS Referring Phys: Randa Evens [id number] IMPRESSIONS  1. Left ventricular ejection fraction, by estimation, is 65 to 70%. The left ventricle has normal function. The left ventricle has no regional wall motion abnormalities. Left ventricular diastolic parameters are consistent with Grade II diastolic dysfunction (pseudonormalization). The average left ventricular global longitudinal strain is 25.7 %. The global longitudinal strain is normal.  2. Right ventricular systolic function is normal. The right ventricular size is normal. There is moderately elevated pulmonary artery systolic pressure. The estimated right ventricular systolic pressure is 12.4 mmHg.  3. The mitral valve is normal in structure.  Trivial mitral valve regurgitation. No evidence of mitral stenosis, the mean gradient is elevated but the valve opens well -- possible elevation in gradient due to high flow. The mean mitral valve gradient is 6.0 mmHg. Moderate mitral annular calcification.  4. The aortic valve is tricuspid. Aortic valve regurgitation is not visualized. No aortic stenosis is present, the mean gradient is elevated but the valve opens well -- possible elevation in gradient due to high flow (same as mitral valve). Aortic valve  area, by VTI measures 3.23 cm. Aortic valve mean gradient measures 17.0 mmHg.  5. Aortic dilatation noted. There  is mild dilatation of the ascending aorta, measuring 42 mm.  6. Left atrial size was severely dilated.  7. The inferior vena cava is dilated in size with <50% respiratory variability, suggesting right atrial pressure of 15 mmHg.  8. No definite ASD was visualized. FINDINGS  Left Ventricle: Left ventricular ejection fraction, by estimation, is 65 to 70%. The left ventricle has normal function. The left ventricle has no regional wall motion abnormalities. The average left ventricular global longitudinal strain is 25.7 %. The  global longitudinal strain is normal. The left ventricular internal cavity size was normal in size. There is no left ventricular hypertrophy. Left ventricular diastolic parameters are consistent with Grade II diastolic dysfunction (pseudonormalization). Right Ventricle: The right ventricular size is normal. No increase in right ventricular wall thickness. Right ventricular systolic function is normal. There is moderately elevated pulmonary artery systolic pressure. The tricuspid regurgitant velocity is 2.92 m/s, and with an assumed right atrial pressure of 15 mmHg, the estimated right ventricular systolic pressure is 10.9 mmHg. Left Atrium: Left atrial size was severely dilated. Right Atrium: Right atrial size was normal in size. Pericardium: There is no evidence of pericardial  effusion. Mitral Valve: The mitral valve is normal in structure. Moderate mitral annular calcification. Trivial mitral valve regurgitation. No evidence of mitral valve stenosis. MV peak gradient, 14.7 mmHg. The mean mitral valve gradient is 6.0 mmHg. Tricuspid Valve: The tricuspid valve is normal in structure. Tricuspid valve regurgitation is trivial. Aortic Valve: The aortic valve is tricuspid. Aortic valve regurgitation is not visualized. No aortic stenosis is present. Aortic valve mean gradient measures 17.0 mmHg. Aortic valve peak gradient measures 27.8 mmHg. Aortic valve area, by VTI measures 3.23 cm. Pulmonic Valve: The pulmonic valve was normal in structure. Pulmonic valve regurgitation is trivial. Aorta: Aortic dilatation noted. There is mild dilatation of the ascending aorta, measuring 42 mm. Venous: The inferior vena cava is dilated in size with less than 50% respiratory variability, suggesting right atrial pressure of 15 mmHg. IAS/Shunts: No definite ASD was visualized.  LEFT VENTRICLE PLAX 2D LVIDd:         5.20 cm   Diastology LVIDs:         3.10 cm   LV e' medial:    11.40 cm/s LV PW:         0.90 cm   LV E/e' medial:  13.3 LV IVS:        1.10 cm   LV e' lateral:   15.70 cm/s LVOT diam:     2.40 cm   LV E/e' lateral: 9.7 LV SV:         177 LV SV Index:   92        2D Longitudinal Strain LVOT Area:     4.52 cm  2D Strain GLS (A2C):   26.2 %                          2D Strain GLS (A3C):   25.9 %                          2D Strain GLS (A4C):   24.9 %                          2D Strain GLS Avg:     25.7 %  3D Volume EF:                          3D EF:        66 %                          LV EDV:       203 ml                          LV ESV:       69 ml                          LV SV:        134 ml RIGHT VENTRICLE RV S prime:     19.20 cm/s RVOT diam:      2.90 cm TAPSE (M-mode): 3.7 cm LEFT ATRIUM              Index        RIGHT ATRIUM           Index LA diam:        5.00 cm   2.61 cm/m   RA Area:     15.80 cm LA Vol (A2C):   143.0 ml 74.61 ml/m  RA Volume:   42.10 ml  21.96 ml/m LA Vol (A4C):   153.0 ml 79.82 ml/m LA Biplane Vol: 154.0 ml 80.34 ml/m  AORTIC VALVE AV Area (Vmax):    3.33 cm AV Area (Vmean):   3.25 cm AV Area (VTI):     3.23 cm AV Vmax:           263.80 cm/s AV Vmean:          181.800 cm/s AV VTI:            0.547 m AV Peak Grad:      27.8 mmHg AV Mean Grad:      17.0 mmHg LVOT Vmax:         194.00 cm/s LVOT Vmean:        130.500 cm/s LVOT VTI:          0.390 m LVOT/AV VTI ratio: 0.71  AORTA Ao Root diam: 3.50 cm Ao Asc diam:  4.15 cm MITRAL VALVE                TRICUSPID VALVE MV Area (PHT): cm          TR Peak grad:   34.1 mmHg MV Area VTI:   2.48 cm     TR Vmax:        292.00 cm/s MV Peak grad:  14.7 mmHg MV Mean grad:  6.0 mmHg     SHUNTS MV Vmax:       1.92 m/s     Systemic VTI:  0.39 m MV Vmean:      107.0 cm/s   Systemic Diam: 2.40 cm MV Decel Time: 247 msec     Pulmonic Diam: 2.90 cm MV E velocity: 151.75 cm/s MV A velocity: 134.50 cm/s MV E/A ratio:  1.13 Dalton McleanMD Electronically signed by Franki Monte Signature Date/Time: 09/02/2021/6:22:17 PM    Final    IR EMBO ARTERIAL NOT HEMORR HEMANG INC GUIDE ROADMAPPING  Result Date: 09/18/2021 INDICATION: 70 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 She presents for splenic angiogram and embolization of the  aneurysm. EXAM: IR EMBO ARTERIAL NOT HEMORR HEMANG INC GUIDE ROADMAPPING; ADDITIONAL ARTERIOGRAPHY; IR ULTRASOUND GUIDANCE VASC ACCESS RIGHT; SELECTIVE VISCERAL ARTERIOGRAPHY MEDICATIONS: 500 mL Levaquin. Additionally, 10 mg Decadron and 30 mg Toradol were administered during the procedure. The antibiotic was administered within 1 hour of the procedure ANESTHESIA/SEDATION: General anesthesia. CONTRAST:  83mL OMNIPAQUE IOHEXOL 300 MG/ML SOLN, 78mL OMNIPAQUE IOHEXOL 300 MG/ML SOLN FLUOROSCOPY TIME:  Fluoroscopy Time: 37 minutes 6 seconds (4187 mGy).  COMPLICATIONS: None immediate. PROCEDURE: Informed consent was obtained from the patient following explanation of the procedure, risks, benefits and alternatives. The patient understands, agrees and consents for the procedure. All questions were addressed. A time out was performed prior to the initiation of the procedure. Maximal barrier sterile technique utilized including caps, mask, sterile gowns, sterile gloves, large sterile drape, hand hygiene, and Betadine prep. The right common femoral artery was interrogated with ultrasound and found to be widely patent. An image was obtained and stored for the medical record. Local anesthesia was attained by infiltration with 1% lidocaine. A small dermatotomy was made. Under real-time sonographic guidance, the vessel was punctured with a 21 gauge micropuncture needle. Using standard technique, the initial micro needle was exchanged over a 0.018 micro wire for a transitional 4 [place] micro sheath. The micro sheath was then exchanged over a 0.035 wire for a 5 French vascular sheath. A C2 cobra catheter was used to select the celiac artery. A celiac arteriogram was performed. Conventional celiac axis anatomy. The approximately 3.2 cm aneurysm of the distal splenic artery was easily identified. A glidewire was advanced more distally into the splenic artery. The C2 cobra catheter was advanced into the mid aspect of the splenic artery. The glidewire was exchanged for a Rosen wire. The 5 [place] vascular sheath was exchanged for a 6 French 45 cm to rubor destination sheath. This was advanced into the proximal splenic artery. The Cobra catheter was then exchanged for a 5 French glide catheter which was then navigated even more distally into the splenic artery. One is much distal progression as possible could be made, a lantern microcatheter was introduced over a Fathom 16 wire in used to navigate into the aneurysm sac, and ultimately through the aneurysm sac into the outflow  vessels. As seen on prior CTA imaging, the outflow vessel bifurcates after a short distance in provide supply to both the mid and inferior aspect of the spleen. A more proximal a branch arising from the mid splenic artery provides flow to the upper pole of the spleen. Once the microcatheter was successfully navigated into the lower pole branch. Coil embolization of the outflow arteries was performed using a series of penumbra detachable microcoils. Once the outflow arteries had been embolized into the aneurysm sac, the aneurysm sac itself was stabilized by placement of two 36 mm and two 32 mm penumbra framing coils. Once the aneurysm was successfully framed, a 14 mm fibered interlock detachable coil was partially deployed on the back bench and soaked in thrombin solution. The catheter was then re- sheathed and successfully deployed into the center of the framed aneurysm. The inflow artery was then coil embolized using a series of penumbra coils terminating with a 45 cm liquid metal coil. Contrast injection performed at this time confirms no antegrade flow into the aneurysm sac and only retrograde flow in the splenic artery. Therefore, the microcatheter was removed. Injections were performed through the 5 French catheter in the more proximal splenic artery as well as through the 6 [place] sheath in  the origin of the celiac artery. The proximal and mid splenic artery remain patent. The proximal branch to the upper pole is patent with excellent contrast blush of the upper pole of the spleen. Unfortunately, there is no significant reconstitution of more distal splenic branches resulting in absent parenchymal blush in the mid and distal spleen. 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 IMPRESSION: 1. Successful coil embolization of distal splenic artery aneurysm with no evidence of residual flow into the aneurysm sac. 2. This results in devascularization of approximately 2/3 of the splenic volume,  predominantly in the mid and lower pole. 3. Persistent parenchymal opacification of the upper pole of the spleen due to a more proximal upper pole branch. PLAN: 1. 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 2. We will manage her aggressively with steroids, NSAIDs and normal caudate pain medications as needed. 3. Monitor signs and symptoms of post embolization syndrome. 4. Intravenous Levaquin while an inpatient. Upon discharge patient will complete a 2 week course of moxifloxacin 500 mg daily Electronically Signed   By: Jacqulynn Cadet M.D.   On: 09/18/2021 19:41   MR ANGIO HEAD WO W CONTRAST  Result Date: 09/15/2021 CLINICAL DATA:  Visceral aneurysm, rule out cerebral aneurysm. EXAM: MRA HEAD WITHOUT AND WITH CONTRAST TECHNIQUE: Angiographic images of the Circle of Willis were obtained using MRA technique with and without intravenous contrast. On a professional basis, the patient should only be billed for a noncontrast intracranial MRA. CONTRAST:  7.60mL GADAVIST GADOBUTROL 1 MMOL/ML IV SOLN COMPARISON:  None. FINDINGS: ICA tortuosity at the skull base.  No beading or dissection. Negative for intracranial aneurysm, beading, or vascular malformation. No branch occlusion or stenosis. Postcontrast images of the arterial system were acquired and are noncontributory. Neck MRA images were inadvertently acquired and are negative. IMPRESSION: 1. Negative for cerebral aneurysm. 2. Inadvertent neck MRA which is also negative Electronically Signed   By: Jorje Guild M.D.   On: 09/15/2021 11:26   IR Radiologist Eval & Mgmt  Result Date: 09/09/2021 Please refer to notes tab for details about interventional procedure. 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 Multiplanar reconstructed images and MIPs were obtained and reviewed to evaluate the vascular anatomy. CONTRAST:  128mL OMNIPAQUE IOHEXOL 350 MG/ML SOLN COMPARISON:  CT abdomen pelvis 08/26/2021, IR embolization 09/18/2021 FINDINGS: VASCULAR No active extravasation of intravenous contrast. Aorta: Mild atherosclerotic plaque. Normal caliber aorta without aneurysm, dissection, vasculitis or significant stenosis. Celiac: Patent without evidence of aneurysm, dissection, vasculitis or significant stenosis. The splenic artery/splenic hilar vessel are not visualized due to marked streak artifact from the splenic vasculature coiling. SMA: Patent without evidence of aneurysm, dissection, vasculitis or significant stenosis. Renals: Both renal arteries are patent without evidence of aneurysm, dissection, vasculitis, fibromuscular dysplasia or significant stenosis. IMA: Patent without evidence of aneurysm, dissection, vasculitis or significant stenosis. Inflow: Patent without evidence of aneurysm, dissection, vasculitis or significant stenosis. Proximal Outflow: Bilateral common femoral and visualized portions of the superficial and profunda femoral arteries are patent without evidence of aneurysm, dissection, vasculitis or  significant stenosis. Veins: Marked left upper quadrant venous collaterals. The hepatic veins are patent. Chronic portal vein occlusion. Review of the MIP images confirms the above findings. NON-VASCULAR Lower chest: No artery calcifications.  No acute abnormality. Hepatobiliary: Redemonstration of right hepatic lobe hemangioma. No new focal liver abnormality. Gallbladder sludge versus gallstone within the gallbladder lumen. No gallbladder wall  thickening or pericholecystic fluid. No biliary dilatation. Pancreas: No focal lesion. Normal pancreatic contour. No surrounding inflammatory changes. No main pancreatic ductal dilatation. 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 No definite active hemorrhage of the splenic parenchyma noted. Adrenals/Urinary Tract: No adrenal nodule bilaterally. Bilateral kidneys enhance symmetrically. No hydronephrosis. No hydroureter. The urinary bladder is unremarkable. Stomach/Bowel: Stomach is within normal limits. No evidence of bowel wall thickening or dilatation. Appendix appears normal. Lymphatic: No lymphadenopathy Reproductive: Intramural pericentimeter uterine fibroid. Uterus and bilateral adnexa are unremarkable. Other: Trace simple free fluid within the pelvis. No intraperitoneal free gas. No organized fluid collection. Musculoskeletal: Subcutaneus soft tissue edema more prominent along the upper flanks. No large hematoma formation. No suspicious lytic or blastic osseous lesions. No acute displaced fracture. IMPRESSION: VASCULAR 1. Markedly limited evaluation of the splenic hilum vasculature due to marked metallic artifact originating from the vascular coiling. No pneumoperitoneum suggest active intraperitoneal bleed. 2. No acute vascular abnormality. 3. 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 NON-VASCULAR 1. Interval development of descending colitis. No CT evidence of definite gastrointestinal bleed. 2. 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 No findings to suggest definite active splenic hemorrhage. 3. Gallbladder sludge versus gallstone within the gallbladder lumen. 4. Intramural uterine fibroid. 5. Trace nonspecific simple free fluid within the pelvis. These results were  called by telephone at the time of interpretation on 09/28/2021 at 11:15 pm to provider Dr. Almyra Free, who verbally acknowledged these results. Electronically Signed   By: Iven Finn M.D.   On: 09/28/2021 23:23    ASSESSMENT & PLAN Courtney Buckley 70 y.o. female with medical history significant for myelofibrosis currently followed by Dr. Joan Mayans at [facility] who presents with abdominal pain following a splenic artery embolization and was found to have marked worsening anemia and new thrombocytosis.  After review of the labs, review of the records, and discussion with the patient the patients findings are most consistent with a constellation of symptoms which are unclearly related.  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  The patient developed severe anemia as well as thrombocytosis and leukocytosis.  The most likely explanation for these current findings are white blood cells and platelets being elevated as acute phase reactants to an inflammatory condition.  This would most likely be due to the colitis, though the infarcted tissue of the spleen could also be causing these elevations.  The drop in the hemoglobin could also be caused by colitis, though with her history of cirrhosis other possible GI bleed should be considered.  Less likely to represent sequestration, but possible.  Additionally with thrombocytosis there may be a component of iron deficiency.  At this time would recommend transfusion support with transfusion for hemoglobin less than 8.0.  I would recommend holding her Jakafi medication while inpatient.  Additionally would recommend continuing to  monitor CBC on a daily basis.  Appreciate the assistance of gastroenterology for work-up of possible GI source of bleed.  Hematology will continue to follow.  #Splenic Artery Embolization #Thrombocytosis # Severe  Anemia --recommend transfusion for Hgb <8.0. Etiology of anemia is not entirely clear. Possible component of sequestration. Appreciate consultation with GI.  --thrombocytosis etiology is not entirely clear, likely due to inflammation (from colitis vs embolization), possible component of iron deficiency --concurrent leukocytosis is concerning for acute phase response, likely in setting of colitis vs infarcted spleen --thrombocytosis is trending downward, found to be 838 from 1026 yesterday.  --will order iron panel, ferritin, ESR with next labs --continue to monitor --Hematology will continue to follow.  # Myelofibrosis, JAK2 positive  --recommend holding ruxolitinib in the inpatient setting --defer to Dr. Joan Mayans at [facility] regarding long term care for her myelofibrosis  All questions were answered. The patient knows to call the clinic with any problems, questions or concerns.  A total of more than 50 minutes were spent on this encounter with face-to-face time and non-face-to-face time, including preparing to see the patient, ordering tests and/or medications, counseling the patient and coordination of care as outlined above.   Ledell Peoples, MD Department of Hematology/Oncology [place] at [facility] Phone: [phone] Pager: [phone] Email: [email or web address]  09/29/2021 3:47 PM

## 2021-09-29 NOTE — ED Notes (Signed)
Pt ambulated to the bathroom, steady gait noted. Instructed pt to pull the call light string in the bathroom for help or when finished. Pt understood.

## 2021-09-29 NOTE — Consult Note (Signed)
Supervising Physician: Jacqulynn Cadet  Patient Status:  [facility] - In-pt  Chief Complaint:  Anemia, abdominal pain  HPI: [name] is a 70 yo female, known to Dr. 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    She has a PMH significant for cirrhosis, depression, esophageal varices, portal hypertension, chronic portal vein thrombosis with cavernous transformation and marked splenorenal portosystemic collateralization in addition to JAK2 mutation myelofibrosis.  Embolization of aneurysm resulted in devascularization of approximately two thirds of the splenic volume.  She presented to the ED with severe generalized abdominal pain wrapping around to her back bilaterally, and having nausea and dry heaves.  Labs found to have Hgb 4.0, WBC 30.5, PLT 1026.  CTA negative for active bleeding, but suggestive of descending colitis.  Allergies: Patient has no known allergies.  Medications: Prior to Admission medications   Medication Sig Start Date End Date Taking? Authorizing Provider  docusate sodium (COLACE) 100 MG capsule Take 100 mg by mouth See admin instructions. Hs x 10 days   Yes [provider]  furosemide (LASIX) 20 MG tablet Take 1 tablet (20 mg total) by mouth 2 (two) times daily. 07/14/21 09/28/21 Yes Allwardt, Randa Evens, PA-C  HYDROcodone-acetaminophen (NORCO) 10-325 MG tablet Take 1 tablet by mouth every 6 (six) hours as needed for up to 5 days for severe pain. Patient taking differently: Take 0.5 tablets by mouth every 6 (six) hours as needed for severe pain. 09/24/21 09/29/21 Yes Candiss Norse A, PA-C  ibuprofen (ADVIL) 200 MG tablet Take 400 mg by mouth every 6 (six) hours as needed for headache or moderate pain.   Yes [provider]  JAKAFI 10 MG tablet Take 10 mg by mouth 2 (two) times daily. 06/11/21  Yes [provider]  methylPREDNISolone (MEDROL DOSEPAK) 4 MG TBPK tablet 6 day taper dose. Patient taking  differently: Take 4 mg by mouth See admin instructions. 8,2,4,2,3,5 09/20/21  Yes Jacqualine Mau, NP  moxifloxacin (AVELOX) 400 MG tablet Take 1 tablet (400 mg total) by mouth daily at 8 pm for 12 days. Patient taking differently: Take 400 mg by mouth See admin instructions. At 8 pm x 12 days 09/20/21 10/02/21 Yes Jacqualine Mau, NP  Multiple Vitamin (MULTI VITAMIN PO) Take 1 tablet by mouth daily.   Yes [provider]  promethazine (PHENERGAN) 12.5 MG tablet Take 12.5 mg by mouth every 6 (six) hours as needed for nausea or vomiting.   Yes [provider]  rifaximin (XIFAXAN) 550 MG TABS tablet Take 550 mg by mouth 2 (two) times daily.   Yes [provider]  lactulose (CHRONULAC) 10 GM/15ML solution Take 45 ml daily Patient not taking: Reported on 09/28/2021 09/05/21   Esterwood, Amy S, PA-C     Vital Signs: BP (!) 115/53 (BP Location: Right Arm)   Pulse 88   Temp 98.5 F (36.9 C) (Oral)   Resp 20   Ht 5\' 6"  (1.676 m)   Wt 180 lb (81.6 kg)   SpO2 94%   BMI 29.05 kg/m   Physical Exam Constitutional:      General: She is not in acute distress. HENT:     Head: Normocephalic and atraumatic.     Mouth/Throat:     Mouth: Mucous membranes are moist.     Pharynx: Oropharynx is clear.  Eyes:     Extraocular Movements: Extraocular movements intact.  Cardiovascular:     Rate and Rhythm: Normal rate and regular rhythm.  Pulmonary:  Effort: Pulmonary effort is normal.  Abdominal:     General: Abdomen is flat.     Palpations: Abdomen is soft.     Tenderness: There is abdominal tenderness in the left lower quadrant.  Skin:    General: Skin is dry.  Neurological:     General: No focal deficit present.     Mental Status: She is alert.  Psychiatric:        Mood and Affect: Mood normal.    Imaging: CT Angio Abd/Pel W and/or Wo Contrast  Result Date: 09/28/2021 CLINICAL DATA:  Splenic artery dissection suspected Need triple-phase study per  IR request - hx of splenic artery embolization here with anemia, evaluation for active bleeding EXAM: CTA ABDOMEN AND PELVIS WITHOUT AND WITH CONTRAST TECHNIQUE: Multidetector CT imaging of the abdomen and pelvis was performed using the standard protocol during bolus administration of intravenous contrast. Multiplanar reconstructed images and MIPs were obtained and reviewed to evaluate the vascular anatomy. CONTRAST:  137mL OMNIPAQUE IOHEXOL 350 MG/ML SOLN COMPARISON:  CT abdomen pelvis 08/26/2021, IR embolization 09/18/2021 FINDINGS: VASCULAR No active extravasation of intravenous contrast. Aorta: Mild atherosclerotic plaque. Normal caliber aorta without aneurysm, dissection, vasculitis or significant stenosis. Celiac: Patent without evidence of aneurysm, dissection, vasculitis or significant stenosis. The splenic artery/splenic hilar vessel are not visualized due to marked streak artifact from the splenic vasculature coiling. SMA: Patent without evidence of aneurysm, dissection, vasculitis or significant stenosis. Renals: Both renal arteries are patent without evidence of aneurysm, dissection, vasculitis, fibromuscular dysplasia or significant stenosis. IMA: Patent without evidence of aneurysm, dissection, vasculitis or significant stenosis. Inflow: Patent without evidence of aneurysm, dissection, vasculitis or significant stenosis. Proximal Outflow: Bilateral common femoral and visualized portions of the superficial and profunda femoral arteries are patent without evidence of aneurysm, dissection, vasculitis or significant stenosis. Veins: Marked left upper quadrant venous collaterals. The hepatic veins are patent. Chronic portal vein occlusion. Review of the MIP images confirms the above findings. NON-VASCULAR Lower chest: No artery calcifications.  No acute abnormality. Hepatobiliary: Redemonstration of right hepatic lobe hemangioma. No new focal liver abnormality. Gallbladder sludge versus gallstone within the  gallbladder lumen. No gallbladder wall thickening or pericholecystic fluid. No biliary dilatation. Pancreas: No focal lesion. Normal pancreatic contour. No surrounding inflammatory changes. No main pancreatic ductal dilatation. 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 No definite active hemorrhage of the splenic parenchyma noted. Adrenals/Urinary Tract: No adrenal nodule bilaterally. Bilateral kidneys enhance symmetrically. No hydronephrosis. No hydroureter. The urinary bladder is unremarkable. Stomach/Bowel: Stomach is within normal limits. No evidence of bowel wall thickening or dilatation. Appendix appears normal. Lymphatic: No lymphadenopathy Reproductive: Intramural pericentimeter uterine fibroid. Uterus and bilateral adnexa are unremarkable. Other: Trace simple free fluid within the pelvis. No intraperitoneal free gas. No organized fluid collection. Musculoskeletal: Subcutaneus soft tissue edema more prominent along the upper flanks. No large hematoma formation. No suspicious lytic or blastic osseous lesions. No acute displaced fracture. IMPRESSION: VASCULAR 1. Markedly limited evaluation of the splenic hilum vasculature due to marked metallic artifact originating from the vascular coiling. No pneumoperitoneum suggest active intraperitoneal bleed. 2. No acute vascular abnormality. 3. 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 NON-VASCULAR 1. Interval development of descending colitis. No CT evidence of definite gastrointestinal bleed. 2. 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 No findings  to suggest definite active splenic hemorrhage. 3. Gallbladder sludge versus gallstone within the gallbladder lumen. 4. Intramural uterine fibroid. 5. Trace nonspecific simple free fluid  within the pelvis. These results were called by telephone at the time of interpretation on 09/28/2021 at 11:15 pm to provider Dr. Almyra Free, who verbally acknowledged these results. Electronically Signed   By: Iven Finn M.D.   On: 09/28/2021 23:23    Labs:  CBC: Recent Labs    09/19/21 0132 09/20/21 0053 09/28/21 1933 09/29/21 0939 09/29/21 1430  WBC 31.1* 24.2* 30.5* 31.1*  --   HGB 9.7* 8.6* 4.0* 7.8* 7.1*  HCT 30.1* 28.3* 13.6* 23.8* 22.9*  PLT 377 329 1,026* 838*  --     COAGS: Recent Labs    07/02/21 1538 09/03/21 1614 09/18/21 1140  INR 1.8* 1.5* 1.7*    BMP: Recent Labs    09/18/21 1140 09/19/21 0132 09/20/21 0053 09/28/21 1933  NA 143 139 136 140  K 4.1 4.1 4.7 4.3  CL 113* 112* 109 111  CO2 23 22 22  21*  GLUCOSE 92 151* 150* 112*  BUN 12 10 22 18   CALCIUM 8.4* 8.2* 8.2* 7.9*  CREATININE 0.61 0.66 0.77 0.67  GFRNONAA >60 >60 >60 >60    LIVER FUNCTION TESTS: Recent Labs    08/27/21 2301 09/03/21 1614 09/20/21 0053 09/28/21 1933  BILITOT 5.0* 4.7* 2.1* 2.0*  AST 37 21 54* 28  ALT 24 15 21 16   ALKPHOS 74 82 66 83  PROT 5.6* 5.2* 4.4* 4.0*  ALBUMIN 3.5 3.3* 2.7* 2.4*    Assessment and Plan:  Anemia --11 days status post splenic artery aneurysm coil embolization --no active bleeding noted --infarcted spleen, interval increased splenomegaly, but no active splenic hemorrhage --recommend transfusion as needed until can manage with more conservative measures --recommend screen for GI bleed --recommend Hem/Onc consult regarding needing to adjust Jakafi medication now that spleen is largely infarcted.  Possible descending Colitis --recommend screen C. Diff     IR will continue to follow and is available as needed  Electronically Signed: Pasty Spillers, PA 09/29/2021, 4:24 PM   I spent a total of 35 Minutes at the the patient's bedside AND on the patient's hospital floor or unit, greater than 50% of which was counseling/coordinating  care for anemia status post coil embolization of splenic artery.

## 2021-09-29 NOTE — Telephone Encounter (Signed)
She's in the hospital

## 2021-09-29 NOTE — ED Provider Notes (Addendum)
Patient signed out to me with CT pending.  Patient with significant anemia secondary to procedure.  CT does not show any hemoperitoneum or active bleeding.  Admit to hospitalist for blood transfusion and monitoring.   Orpah Greek, MD 09/29/21 0007    Orpah Greek, MD 09/29/21 [phone]

## 2021-09-29 NOTE — ED Notes (Signed)
Pt ambulated back to bed, vitals recycled on dinamap.

## 2021-09-30 ENCOUNTER — Observation Stay (HOSPITAL_COMMUNITY): Payer: Medicare PPO | Admitting: Certified Registered"

## 2021-09-30 ENCOUNTER — Encounter (HOSPITAL_COMMUNITY)
Admission: EM | Disposition: A | Discharge status: Home / Self Care | Payer: Self-pay | Source: Home / Self Care | Attending: Family Medicine

## 2021-09-30 ENCOUNTER — Encounter (HOSPITAL_COMMUNITY): Payer: Self-pay | Admitting: Internal Medicine

## 2021-09-30 DIAGNOSIS — K259 Gastric ulcer, unspecified as acute or chronic, without hemorrhage or perforation: Secondary | ICD-10-CM

## 2021-09-30 DIAGNOSIS — Z8719 Personal history of other diseases of the digestive system: Secondary | ICD-10-CM | POA: Diagnosis not present

## 2021-09-30 DIAGNOSIS — D75838 Other thrombocytosis: Secondary | ICD-10-CM | POA: Diagnosis present

## 2021-09-30 DIAGNOSIS — D62 Acute posthemorrhagic anemia: Secondary | ICD-10-CM | POA: Diagnosis not present

## 2021-09-30 DIAGNOSIS — Z86718 Personal history of other venous thrombosis and embolism: Secondary | ICD-10-CM | POA: Diagnosis not present

## 2021-09-30 DIAGNOSIS — Z20822 Contact with and (suspected) exposure to covid-19: Secondary | ICD-10-CM | POA: Diagnosis present

## 2021-09-30 DIAGNOSIS — R1084 Generalized abdominal pain: Secondary | ICD-10-CM | POA: Diagnosis not present

## 2021-09-30 DIAGNOSIS — D500 Iron deficiency anemia secondary to blood loss (chronic): Secondary | ICD-10-CM | POA: Diagnosis present

## 2021-09-30 DIAGNOSIS — K529 Noninfective gastroenteritis and colitis, unspecified: Secondary | ICD-10-CM | POA: Diagnosis present

## 2021-09-30 DIAGNOSIS — K254 Chronic or unspecified gastric ulcer with hemorrhage: Secondary | ICD-10-CM | POA: Diagnosis present

## 2021-09-30 DIAGNOSIS — I8510 Secondary esophageal varices without bleeding: Secondary | ICD-10-CM

## 2021-09-30 DIAGNOSIS — Z79899 Other long term (current) drug therapy: Secondary | ICD-10-CM | POA: Diagnosis not present

## 2021-09-30 DIAGNOSIS — D735 Infarction of spleen: Secondary | ICD-10-CM | POA: Diagnosis present

## 2021-09-30 DIAGNOSIS — K7469 Other cirrhosis of liver: Secondary | ICD-10-CM | POA: Diagnosis present

## 2021-09-30 DIAGNOSIS — K641 Second degree hemorrhoids: Secondary | ICD-10-CM

## 2021-09-30 DIAGNOSIS — D7581 Myelofibrosis: Secondary | ICD-10-CM | POA: Diagnosis present

## 2021-09-30 DIAGNOSIS — D72829 Elevated white blood cell count, unspecified: Secondary | ICD-10-CM | POA: Diagnosis not present

## 2021-09-30 DIAGNOSIS — Z833 Family history of diabetes mellitus: Secondary | ICD-10-CM | POA: Diagnosis not present

## 2021-09-30 DIAGNOSIS — Z800 Family history of malignant neoplasm of digestive organs: Secondary | ICD-10-CM | POA: Diagnosis not present

## 2021-09-30 DIAGNOSIS — Z87891 Personal history of nicotine dependence: Secondary | ICD-10-CM | POA: Diagnosis not present

## 2021-09-30 DIAGNOSIS — K648 Other hemorrhoids: Secondary | ICD-10-CM | POA: Diagnosis present

## 2021-09-30 HISTORY — PX: BIOPSY: SHX5522

## 2021-09-30 HISTORY — PX: FLEXIBLE SIGMOIDOSCOPY: SHX5431

## 2021-09-30 HISTORY — PX: ESOPHAGOGASTRODUODENOSCOPY (EGD) WITH PROPOFOL: SHX5813

## 2021-09-30 LAB — COMPREHENSIVE METABOLIC PANEL
ALT: 14 U/L (ref 0–44)
AST: 23 U/L (ref 15–41)
Albumin: 2.2 g/dL — ABNORMAL LOW (ref 3.5–5.0)
Alkaline Phosphatase: 91 U/L (ref 38–126)
Anion gap: 6 (ref 5–15)
BUN: 15 mg/dL (ref 8–23)
CO2: 23 mmol/L (ref 22–32)
Calcium: 7.6 mg/dL — ABNORMAL LOW (ref 8.9–10.3)
Chloride: 108 mmol/L (ref 98–111)
Creatinine, Ser: 0.62 mg/dL (ref 0.44–1.00)
GFR, Estimated: >60 mL/min (ref >60)
Glucose, Bld: 91 mg/dL (ref 70–99)
Potassium: 3.3 mmol/L — ABNORMAL LOW (ref 3.5–5.1)
Sodium: 137 mmol/L (ref 135–145)
Total Bilirubin: 3.3 mg/dL — ABNORMAL HIGH (ref 0.3–1.2)
Total Protein: 3.8 g/dL — ABNORMAL LOW (ref 6.5–8.1)

## 2021-09-30 LAB — CBC WITH DIFFERENTIAL/PLATELET
Abs Immature Granulocytes: 0.97 10*3/uL — ABNORMAL HIGH (ref 0.00–0.07)
Basophils Absolute: 0.4 10*3/uL — ABNORMAL HIGH (ref 0.0–0.1)
Basophils Relative: 1 %
Eosinophils Absolute: 0.7 10*3/uL — ABNORMAL HIGH (ref 0.0–0.5)
Eosinophils Relative: 2 %
HCT: 22.4 % — ABNORMAL LOW (ref 36.0–46.0)
Hemoglobin: 7 g/dL — ABNORMAL LOW (ref 12.0–15.0)
Immature Granulocytes: 3 %
Lymphocytes Relative: 1 %
Lymphs Abs: 0.3 10*3/uL — ABNORMAL LOW (ref 0.7–4.0)
MCH: 28.6 pg (ref 26.0–34.0)
MCHC: 31.3 g/dL (ref 30.0–36.0)
MCV: 91.4 fL (ref 80.0–100.0)
Monocytes Absolute: 2.6 10*3/uL — ABNORMAL HIGH (ref 0.1–1.0)
Monocytes Relative: 7 %
Neutro Abs: 30.3 10*3/uL — ABNORMAL HIGH (ref 1.7–7.7)
Neutrophils Relative %: 86 %
Platelets: 750 10*3/uL — ABNORMAL HIGH (ref 150–400)
RBC: 2.45 MIL/uL — ABNORMAL LOW (ref 3.87–5.11)
RDW: 20.8 % — ABNORMAL HIGH (ref 11.5–15.5)
WBC: 35.3 10*3/uL — ABNORMAL HIGH (ref 4.0–10.5)
nRBC: 6.3 % — ABNORMAL HIGH (ref 0.0–0.2)

## 2021-09-30 LAB — SEDIMENTATION RATE: Sed Rate: 9 mm/hr (ref 0–22)

## 2021-09-30 LAB — IRON AND TIBC
Iron: 11 ug/dL — ABNORMAL LOW (ref 28–170)
Saturation Ratios: 6 % — ABNORMAL LOW (ref 10.4–31.8)
TIBC: 185 ug/dL — ABNORMAL LOW (ref 250–450)
UIBC: 174 ug/dL

## 2021-09-30 LAB — FERRITIN: Ferritin: 105 ng/mL (ref 11–307)

## 2021-09-30 LAB — PREPARE RBC (CROSSMATCH)

## 2021-09-30 SURGERY — ESOPHAGOGASTRODUODENOSCOPY (EGD) WITH PROPOFOL
Anesthesia: Monitor Anesthesia Care

## 2021-09-30 MED ORDER — FUROSEMIDE 10 MG/ML IJ SOLN
INTRAMUSCULAR | Status: AC
  Filled 2021-09-30: qty 2

## 2021-09-30 MED ORDER — LACTATED RINGERS IV SOLN
INTRAVENOUS | Status: AC | PRN
  Administered 2021-09-30: 20 mL/h via INTRAVENOUS

## 2021-09-30 MED ORDER — PROPOFOL 500 MG/50ML IV EMUL
INTRAVENOUS | Status: DC | PRN
  Administered 2021-09-30: 125 ug/kg/min via INTRAVENOUS

## 2021-09-30 MED ORDER — DIPHENHYDRAMINE HCL 25 MG PO CAPS
25.0000 mg | ORAL_CAPSULE | Freq: Once | ORAL | Status: DC
  Filled 2021-09-30: qty 1

## 2021-09-30 MED ORDER — PANTOPRAZOLE SODIUM 40 MG PO TBEC
40.0000 mg | DELAYED_RELEASE_TABLET | Freq: Two times a day (BID) | ORAL | Status: DC
  Administered 2021-09-30 – 2021-10-02 (×4): 40 mg via ORAL
  Filled 2021-09-30 (×4): qty 1

## 2021-09-30 MED ORDER — PROPOFOL 10 MG/ML IV BOLUS
INTRAVENOUS | Status: DC | PRN
  Administered 2021-09-30: 20 mg via INTRAVENOUS

## 2021-09-30 MED ORDER — SUCRALFATE 1 GM/10ML PO SUSP
1.0000 g | Freq: Three times a day (TID) | ORAL | Status: DC
  Administered 2021-09-30 – 2021-10-02 (×7): 1 g via ORAL
  Filled 2021-09-30 (×7): qty 10

## 2021-09-30 MED ORDER — SODIUM CHLORIDE 0.9 % IV SOLN
2.0000 g | Freq: Every day | INTRAVENOUS | Status: DC
  Administered 2021-10-01 – 2021-10-02 (×2): 2 g via INTRAVENOUS
  Filled 2021-09-30 (×3): qty 20

## 2021-09-30 MED ORDER — SUCRALFATE 1 GM/10ML PO SUSP
ORAL | Status: AC
  Administered 2021-10-01: 1 g via ORAL
  Filled 2021-09-30: qty 10

## 2021-09-30 MED ORDER — SODIUM CHLORIDE 0.9% IV SOLUTION: Freq: Once | INTRAVENOUS | Status: AC

## 2021-09-30 MED ORDER — FLEET ENEMA 7-19 GM/118ML RE ENEM
1.0000 | ENEMA | Freq: Once | RECTAL | Status: AC
Start: 2021-09-30 — End: 2021-09-30
  Administered 2021-09-30: 1 via RECTAL

## 2021-09-30 MED ORDER — PANTOPRAZOLE SODIUM 40 MG PO TBEC
DELAYED_RELEASE_TABLET | ORAL | Status: AC
  Administered 2021-10-01: 40 mg via ORAL
  Filled 2021-09-30: qty 1

## 2021-09-30 MED ORDER — FUROSEMIDE 10 MG/ML IJ SOLN
20.0000 mg | Freq: Once | INTRAMUSCULAR | Status: AC
  Administered 2021-09-30: 20 mg via INTRAVENOUS

## 2021-09-30 MED ORDER — POTASSIUM CHLORIDE IN NACL 20-0.9 MEQ/L-% IV SOLN
INTRAVENOUS | Status: DC
  Filled 2021-09-30 (×3): qty 1000

## 2021-09-30 MED ORDER — ACETAMINOPHEN 325 MG PO TABS
650.0000 mg | ORAL_TABLET | Freq: Once | ORAL | Status: AC
  Administered 2021-09-30: 650 mg via ORAL

## 2021-09-30 MED ORDER — FLEET ENEMA 7-19 GM/118ML RE ENEM
ENEMA | RECTAL | Status: AC
  Filled 2021-09-30: qty 1

## 2021-09-30 MED ORDER — LIDOCAINE 2% (20 MG/ML) 5 ML SYRINGE
INTRAMUSCULAR | Status: DC | PRN
  Administered 2021-09-30: 20 mg via INTRAVENOUS

## 2021-09-30 SURGICAL SUPPLY — 15 items

## 2021-09-30 NOTE — Progress Notes (Signed)
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 Not a candidate for splenectomy-underwent embolization of splenic artery aneurysm 09/18/2021 Complained of abdominal pain and came to ED found to have hemoglobin 4 WBC 30 platelet count 1 million CTA negative for hemoperitoneum active bleeding   OBJECTIVE: Awake coherent slight disocomfort Ctab no added sound No ict no pallor Abd soft nt--Significant HSM below rib cage Neuro intact     Hospital-Problem based course   Colitis? Likely splenic aneurysm coiling causing leukocytosis No reports of diarrhea etc. however given significant leukocytosis elected to start ceftriaxone Flagyl--keep ABx for 1 more day then woud switch to Augmentin blood culture x2 fecal occult blood pending Leukocytosis severe anemia splenomegaly myelofibrosis JAK2 V6 70F mutation Anemia likely related to NSAID use and Esoph Varices Baseline hemoglobin 8 platelets 300 Transfused 2 units PRBC, 1 more unit 11/29- ? UGIB She tells em she had several days of dark unformed stool after her procedure--only becoming more solid yesterday assosc wit svere upper abd/CP--she has been taking NSAIDs for 3-4 days Keep on clears only for now EGD shows 2 ulcers 1 cm and 0.5 CM deep and cratering--liekly etiology Carafate 1 gm tid ,protonix 40 bid as per GI Need 6-8 week rpt follow up study  DVT prophylaxis: scd Code Status: full  Family Communication: husband at bedsdie Disposition:  Status is: Inpatient  Remains inpatient appropriate because: sick       Consultants:  gi  Procedures:  endo and Sigmoidoscopy 11/28  Antimicrobials: ceftriax, flagyll    Subjective: Well No dark tarry stool No cp No vomit Mild abd pain  Objective: Vitals:   09/30/21 1130 09/30/21 1145 09/30/21 1149 09/30/21 1159  BP: (!) 94/41 (!) 95/40 (!) 96/48 (!) 101/37  Pulse: 74 71 74   Resp: 16 17 16 12   Temp:      TempSrc:      SpO2: 98% 98% 99% 99%  Weight:      Height:        Intake/Output Summary (Last 24 hours) at 09/30/2021 1400 Last data filed at 09/30/2021 1159 Gross per 24 hour  Intake 874.02 ml  Output --  Net 874.02 ml   Filed Weights   09/28/21 1812  Weight: 81.6 kg    Examination:  Pleasant alert coherent in nad Cta b Abd slight distension, no rebound no guard No le edema Neuro intact Psych euthymic  Data Reviewed: personally reviewed   CBC    Component Value Date/Time   WBC 35.3 (H) 09/30/2021 0056   RBC 2.45 (L) 09/30/2021 0056   HGB 7.0 (L) 09/30/2021 0056   HGB 13.8 01/16/2015 0836   HCT 22.4 (L) 09/30/2021 0056   HCT 44.6 01/16/2015 0836   PLT 750 (H) 09/30/2021 0056   PLT 614 (H) 01/16/2015 0836   MCV 91.4 09/30/2021 0056   MCV 74.0 (L) 01/16/2015 0836   MCH 28.6 09/30/2021 0056   MCHC 31.3 09/30/2021 0056   RDW 20.8 (H) 09/30/2021 0056   RDW 24.6 (H) 01/16/2015 0836   LYMPHSABS 0.3 (L) 09/30/2021 0056   LYMPHSABS 1.3 01/16/2015 0836   MONOABS  2.6 (H) 09/30/2021 0056   MONOABS 0.4 01/16/2015 0836   EOSABS 0.7 (H) 09/30/2021 0056   EOSABS 0.4 01/16/2015 0836   BASOSABS 0.4 (H) 09/30/2021 0056   BASOSABS 0.0 01/16/2015 0836   CMP Latest Ref Rng & Units 09/30/2021 09/28/2021 09/20/2021  Glucose 70 - 99 mg/dL 91 112(H) 150(H)  BUN 8 - 23 mg/dL 15 18 22   Creatinine 0.44 - 1.00 mg/dL 0.62 0.67 0.77  Sodium 135 - 145 mmol/L 137 140 136  Potassium 3.5 - 5.1 mmol/L 3.3(L) 4.3 4.7  Chloride 98 - 111 mmol/L 108 111 109  CO2 22 - 32 mmol/L 23 21(L) 22  Calcium 8.9 - 10.3 mg/dL 7.6(L) 7.9(L) 8.2(L)  Total Protein 6.5 - 8.1 g/dL 3.8(L)  4.0(L) 4.4(L)  Total Bilirubin 0.3 - 1.2 mg/dL 3.3(H) 2.0(H) 2.1(H)  Alkaline Phos 38 - 126 U/L 91 83 66  AST 15 - 41 U/L 23 28 54(H)  ALT 0 - 44 U/L 14 16 21      Radiology Studies: CT Angio Abd/Pel W and/or Wo Contrast  Result Date: 09/28/2021 CLINICAL DATA:  Splenic artery dissection suspected Need triple-phase study per IR request - hx of splenic artery embolization here with anemia, evaluation for active bleeding EXAM: CTA ABDOMEN AND PELVIS WITHOUT AND WITH CONTRAST TECHNIQUE: Multidetector CT imaging of the abdomen and pelvis was performed using the standard protocol during bolus administration of intravenous contrast. Multiplanar reconstructed images and MIPs were obtained and reviewed to evaluate the vascular anatomy. CONTRAST:  120mL OMNIPAQUE IOHEXOL 350 MG/ML SOLN COMPARISON:  CT abdomen pelvis 08/26/2021, IR embolization 09/18/2021 FINDINGS: VASCULAR No active extravasation of intravenous contrast. Aorta: Mild atherosclerotic plaque. Normal caliber aorta without aneurysm, dissection, vasculitis or significant stenosis. Celiac: Patent without evidence of aneurysm, dissection, vasculitis or significant stenosis. The splenic artery/splenic hilar vessel are not visualized due to marked streak artifact from the splenic vasculature coiling. SMA: Patent without evidence of aneurysm, dissection, vasculitis or significant stenosis. Renals: Both renal arteries are patent without evidence of aneurysm, dissection, vasculitis, fibromuscular dysplasia or significant stenosis. IMA: Patent without evidence of aneurysm, dissection, vasculitis or significant stenosis. Inflow: Patent without evidence of aneurysm, dissection, vasculitis or significant stenosis. Proximal Outflow: Bilateral common femoral and visualized portions of the superficial and profunda femoral arteries are patent without evidence of aneurysm, dissection, vasculitis or significant stenosis. Veins: Marked left upper quadrant venous  collaterals. The hepatic veins are patent. Chronic portal vein occlusion. Review of the MIP images confirms the above findings. NON-VASCULAR Lower chest: No artery calcifications.  No acute abnormality. Hepatobiliary: Redemonstration of right hepatic lobe hemangioma. No new focal liver abnormality. Gallbladder sludge versus gallstone within the gallbladder lumen. No gallbladder wall thickening or pericholecystic fluid. No biliary dilatation. Pancreas: No focal lesion. Normal pancreatic contour. No surrounding inflammatory changes. No main pancreatic ductal dilatation. 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 No definite active hemorrhage of the splenic parenchyma noted. Adrenals/Urinary Tract: No adrenal nodule bilaterally. Bilateral kidneys enhance symmetrically. No hydronephrosis. No hydroureter. The urinary bladder is unremarkable. Stomach/Bowel: Stomach is within normal limits. No evidence of bowel wall thickening or dilatation. Appendix appears normal. Lymphatic: No lymphadenopathy Reproductive: Intramural pericentimeter uterine fibroid. Uterus and bilateral adnexa are unremarkable. Other: Trace simple free fluid within the pelvis. No intraperitoneal free gas. No organized fluid collection. Musculoskeletal: Subcutaneus soft tissue edema more prominent along the upper flanks. No large hematoma formation. No  suspicious lytic or blastic osseous lesions. No acute displaced fracture. IMPRESSION: VASCULAR 1. Markedly limited evaluation of the splenic hilum vasculature due to marked metallic artifact originating from the vascular coiling. No pneumoperitoneum suggest active intraperitoneal bleed. 2. No acute vascular abnormality. 3. 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 NON-VASCULAR 1. Interval development of descending colitis. No CT evidence of  definite gastrointestinal bleed. 2. 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 No findings to suggest definite active splenic hemorrhage. 3. Gallbladder sludge versus gallstone within the gallbladder lumen. 4. Intramural uterine fibroid. 5. Trace nonspecific simple free fluid within the pelvis. These results were called by telephone at the time of interpretation on 09/28/2021 at 11:15 pm to provider Dr. Almyra Free, who verbally acknowledged these results. Electronically Signed   By: Iven Finn M.D.   On: 09/28/2021 23:23     Scheduled Meds:  sodium chloride   Intravenous Once   diphenhydrAMINE  25 mg Oral Once   pantoprazole  40 mg Oral BID   sucralfate  1 g Oral TID WC & HS   Continuous Infusions:  sodium chloride Stopped (09/28/21 2055)   0.9 % NaCl with KCl 20 mEq / L 75 mL/hr at 09/30/21 1239   [START ON 10/01/2021] cefTRIAXone (ROCEPHIN)  IV     metronidazole 500 mg (09/30/21 1331)     LOS: 0 days   Time spent: [place], MD [facility] To contact the attending provider between 7A-7P or the covering provider during after hours 7P-7A, please log into the web site [email or web address] and access using universal [place] password for that web site. If you do not have the password, please call the hospital operator.  09/30/2021, 2:00 PM

## 2021-09-30 NOTE — Anesthesia Preprocedure Evaluation (Signed)
Anesthesia Evaluation  Patient identified by MRN, date of birth, ID band Patient awake    Reviewed: Allergy & Precautions, NPO status , Patient's Chart, lab work & pertinent test results  History of Anesthesia Complications Negative for: history of anesthetic complications  Airway Mallampati: III  TM Distance: >3 FB Neck ROM: Full    Dental  (+) Dental Advisory Given, Missing,    Pulmonary neg shortness of breath, neg sleep apnea, neg COPD, neg recent URI, former smoker,    breath sounds clear to auscultation       Cardiovascular negative cardio ROS   Rhythm:Regular     Neuro/Psych  Headaches, neg Seizures PSYCHIATRIC DISORDERS Depression    GI/Hepatic negative GI ROS, (+) Cirrhosis   Esophageal Varices    , PVT (portal vein thrombosis)  Lab Results      Component                Value               Date                      ALT                      15                  09/03/2021                AST                      21                  09/03/2021                ALKPHOS                  82                  09/03/2021                BILITOT                  4.7 (H)             09/03/2021              Endo/Other  negative endocrine ROS  Renal/GU negative Renal ROS  negative genitourinary   Musculoskeletal negative musculoskeletal ROS (+)   Abdominal Normal abdominal exam  (+)   Peds  Hematology  (+) Blood dyscrasia, anemia , Lab Results      Component                Value               Date                      WBC                      7.2                 09/18/2021                HGB                      10.3 (L)            09/18/2021  HCT                      34.7 (L)            09/18/2021                MCV                      95.1                09/18/2021                PLT                      258                 09/18/2021              Anesthesia Other Findings    Reproductive/Obstetrics                             Anesthesia Physical  Anesthesia Plan  ASA: 3  Anesthesia Plan: MAC   Post-op Pain Management: Minimal or no pain anticipated   Induction: Intravenous  PONV Risk Score and Plan: 3 and Propofol infusion  Airway Management Planned: Natural Airway and Simple Face Mask  Additional Equipment: None  Intra-op Plan:   Post-operative Plan: Extubation in OR  Informed Consent: I have reviewed the patients History and Physical, chart, labs and discussed the procedure including the risks, benefits and alternatives for the proposed anesthesia with the patient or authorized representative who has indicated his/her understanding and acceptance.       Plan Discussed with: CRNA  Anesthesia Plan Comments:         Anesthesia Quick Evaluation

## 2021-09-30 NOTE — Anesthesia Postprocedure Evaluation (Signed)
Anesthesia Post Note  Patient: Courtney [facility]  Procedure(s) Performed: ESOPHAGOGASTRODUODENOSCOPY (EGD) WITH PROPOFOL FLEXIBLE SIGMOIDOSCOPY BIOPSY     Patient location during evaluation: Endoscopy Anesthesia Type: MAC Level of consciousness: sedated Pain management: pain level controlled Vital Signs Assessment: post-procedure vital signs reviewed and stable Respiratory status: spontaneous breathing Cardiovascular status: stable Postop Assessment: no apparent nausea or vomiting Anesthetic complications: no   No notable events documented.  Last Vitals:  Vitals:   09/30/21 1024 09/30/21 1119  BP: (!) 106/38   Pulse: 79 73  Resp: 12 16  Temp: (!) 36.2 C 37.1 C  SpO2:  100%    Last Pain:  Vitals:   09/30/21 1119  TempSrc:   PainSc: [place] Jr

## 2021-09-30 NOTE — Op Note (Signed)
[facility] Patient Name: Courtney [facility] Procedure Date : 09/30/2021 MRN: [id number] Attending MD: Gerrit Heck , MD Date of Birth: 1951/06/21 CSN: [id number] Age: 70 Admit Type: Inpatient Procedure:                Upper GI endoscopy Indications:              Generalized abdominal pain, Acute post hemorrhagic                            anemia, Esophageal varices                           70 year old female with medical history n/f                            cryptogenic cirrhosis (c/b esophageal varices,                            splenomegaly, hepatic encephalopathy), chronic                            portal vein thrombosis, JAK2 mutation,                            myelofibrosis, PUD. Additionally, recently                            diagnosed with splenic artery aneurysm which was                            treated with IR ablation on 09/18/2021. Admitted                            with continued abdominal pain for the last [redacted] weeks                            along with recent melena, fatigue. Admission labs                            notable for H/H 4.0/13.6, WBC 30.5, PLT 1026. CT                            angiography without active bleeding/extravasation                            or splenic hemorrhage, but did demonstrate                            descending colitis, infarcted spleen with increased                            splenomegaly, sludge/stones in the gallbladder. Providers:                Gerrit Heck, MD, Benetta Spar, Technician,  Jeanella Cara, RN Referring MD:              Medicines:                Monitored Anesthesia Care Complications:            No immediate complications. Estimated Blood Loss:     Estimated blood loss was minimal. Procedure:                Pre-Anesthesia Assessment:                           - Prior to the procedure, a History and Physical                            was  performed, and patient medications and                            allergies were reviewed. The patient's tolerance of                            previous anesthesia was also reviewed. The risks                            and benefits of the procedure and the sedation                            options and risks were discussed with the patient.                            All questions were answered, and informed consent                            was obtained. Prior Anticoagulants: The patient has                            taken no previous anticoagulant or antiplatelet                            agents. ASA Grade Assessment: III - A patient with                            severe systemic disease. After reviewing the risks                            and benefits, the patient was deemed in                            satisfactory condition to undergo the procedure.                           After obtaining informed consent, the endoscope was                            passed under direct vision. Throughout the  procedure, the patient's blood pressure, pulse, and                            oxygen saturations were monitored continuously. The                            GIF-H190 ([id number] Olympus endoscope was introduced                            through the mouth, and advanced to the second part                            of duodenum. The upper GI endoscopy was                            accomplished without difficulty. The patient                            tolerated the procedure well. Scope In: Scope Out: Findings:      The examined esophagus was normal. The previously noted esophageal       varices were not noted on this study.      One non-bleeding, deep, cratered gastric ulcer with no stigmata of       bleeding was found in the prepyloric region of the stomach. The lesion       was 10 mm in largest dimension. Biopsies were taken from the ulcer edge       with  a cold forceps for histology. Estimated blood loss was minimal.      One non-bleeding linear and superficial gastric ulcer with no stigmata       of bleeding was found in the gastric body. The lesion was 5 mm in       largest dimension. Biopsies were taken with a cold forceps for       histology. Estimated blood loss was minimal.      The examined duodenum was normal. Impression:               - Normal esophagus.                           - Non-bleeding gastric ulcer with no stigmata of                            bleeding. Biopsied.                           - Non-bleeding gastric ulcer with no stigmata of                            bleeding. Biopsied.                           - Normal examined duodenum. Recommendation:           - Return patient to hospital ward for ongoing care.                           -  Full liquid diet today.                           - Continue present medications.                           - Await pathology results.                           - Continue serial CBC checks with additional blood                            products per protocol and Hematology                            recommendations.                           - Myelofibrosis management per Hematology service.                           - Use Protonix (pantoprazole) 40 mg PO BID for 8                            weeks.                           - Use sucralfate suspension 1 gram PO QID for 4                            weeks.                           - Repeat upper endoscopy in 6-8 weeks to check                            healing.                           - Flexible sigmoidoscopy today. Procedure Code(s):        --- Professional ---                           [phone], Esophagogastroduodenoscopy, flexible,                            transoral; with biopsy, single or multiple Diagnosis Code(s):        --- Professional ---                           K25.9, Gastric ulcer, unspecified as acute or                             chronic, without hemorrhage or perforation                           R10.84, Generalized abdominal pain  D62, Acute posthemorrhagic anemia                           I85.00, Esophageal varices without bleeding CPT copyright 2019 American Medical Association. All rights reserved. The codes documented in this report are preliminary and upon coder review may  be revised to meet current compliance requirements. Gerrit Heck, MD 09/30/2021 11:29:15 AM Number of Addenda: 0

## 2021-09-30 NOTE — Transfer of Care (Signed)
Immediate Anesthesia Transfer of Care Note  Patient: Courtney [facility]  Procedure(s) Performed: ESOPHAGOGASTRODUODENOSCOPY (EGD) WITH PROPOFOL FLEXIBLE SIGMOIDOSCOPY BIOPSY  Patient Location: Endoscopy Unit  Anesthesia Type:MAC  Level of Consciousness: drowsy and patient cooperative  Airway & Oxygen Therapy: Patient Spontanous Breathing and Patient connected to nasal cannula oxygen  Post-op Assessment: Report given to RN, Post -op Vital signs reviewed and stable and Patient moving all extremities  Post vital signs: Reviewed and stable  Last Vitals:  Vitals Value Taken Time  BP    Temp    Pulse    Resp    SpO2      Last Pain:  Vitals:   09/30/21 1024  TempSrc: Temporal  PainSc: 0-No pain         Complications: No notable events documented.

## 2021-09-30 NOTE — Op Note (Signed)
[facility] Patient Name: Courtney [facility] Procedure Date : 09/30/2021 MRN: [id number] Attending MD: Gerrit Heck , MD Date of Birth: 1950/11/06 CSN: [id number] Age: 70 Admit Type: Inpatient Procedure:                Flexible Sigmoidoscopy Indications:              Generalized abdominal pain, Abnormal CT of the GI                            tract Providers:                Gerrit Heck, MD, Benetta Spar, Technician,                            Jeanella Cara, RN Referring MD:              Medicines:                Monitored Anesthesia Care Complications:            No immediate complications. Estimated Blood Loss:     Estimated blood loss: none. Procedure:                Pre-Anesthesia Assessment:                           - Prior to the procedure, a History and Physical                            was performed, and patient medications and                            allergies were reviewed. The patient's tolerance of                            previous anesthesia was also reviewed. The risks                            and benefits of the procedure and the sedation                            options and risks were discussed with the patient.                            All questions were answered, and informed consent                            was obtained. Prior Anticoagulants: The patient has                            taken no previous anticoagulant or antiplatelet                            agents. ASA Grade Assessment: III - A patient with                            severe  systemic disease. After reviewing the risks                            and benefits, the patient was deemed in                            satisfactory condition to undergo the procedure.                           After obtaining informed consent, the scope was                            passed under direct vision. The GIF-H190 ([id number])                            Olympus  endoscope was introduced through the anus                            and advanced to the the descending colon. The                            flexible sigmoidoscopy was accomplished without                            difficulty. The patient tolerated the procedure                            well. The quality of the bowel preparation was                            adequate. Scope In: Scope Out: Findings:      Hemorrhoids were found on perianal exam.      Normal mucosa was found in the rectum, in the recto-sigmoid colon, in       the sigmoid colon and in the distal descending colon. There was dark       stool in the descending colon that eventually precluded any further       advancement of the sigmoidoscope beyond 70 cm from the anal verge.       However, no active bleeding or fresh bright red blood noted.      Non-bleeding internal hemorrhoids were found during retroflexion. The       hemorrhoids were small. Impression:               - Hemorrhoids found on perianal exam.                           - Normal mucosa in the rectum, in the recto-sigmoid                            colon, in the sigmoid colon and in the distal                            descending colon.                           -  Non-bleeding internal hemorrhoids.                           - No specimens collected.                           - No mucosal erythema, edema, erosions, or                            ulceration noted on this study to suggest colitis                            as was suspected on CT findings. If any further                            suspicion for lower GI pathology, would recommend                            full bowel preparation with colonoscopy. Recommendation:           - Return patient to hospital ward for ongoing care.                           - Please see EGD note for further inpatient                            recommendations for ongoing care. Procedure Code(s):        --- Professional  ---                           [id number], Sigmoidoscopy, flexible; diagnostic,                            including collection of specimen(s) by brushing or                            washing, when performed (separate procedure) Diagnosis Code(s):        --- Professional ---                           K64.8, Other hemorrhoids                           R10.84, Generalized abdominal pain                           R93.3, Abnormal findings on diagnostic imaging of                            other parts of digestive tract CPT copyright 2019 American Medical Association. All rights reserved. The codes documented in this report are preliminary and upon coder review may  be revised to meet current compliance requirements. Gerrit Heck, MD 09/30/2021 11:34:21 AM Number of Addenda: 0

## 2021-09-30 NOTE — Interval H&P Note (Signed)
History and Physical Interval Note:  09/30/2021 10:20 AM  Courtney [facility]ley  has presented today for surgery, with the diagnosis of anemia, melena , abdominal pain, abnormal CT.  The various methods of treatment have been discussed with the patient and family. After consideration of risks, benefits and other options for treatment, the patient has consented to  Procedure(s): ESOPHAGOGASTRODUODENOSCOPY (EGD) WITH PROPOFOL (N/A) FLEXIBLE SIGMOIDOSCOPY (N/A) as a surgical intervention.  The patient's history has been reviewed, patient examined, no change in status, stable for surgery.  I have reviewed the patient's chart and labs.  Questions were answered to the patient's satisfaction.     Dominic Pea Zach Tietje

## 2021-09-30 NOTE — Anesthesia Procedure Notes (Signed)
Procedure Name: MAC Date/Time: 09/30/2021 10:45 AM Performed by: Moshe Salisbury, CRNA Pre-anesthesia Checklist: Emergency Drugs available, Patient identified, Suction available and Patient being monitored Oxygen Delivery Method: Nasal cannula Placement Confirmation: positive ETCO2 Dental Injury: Teeth and Oropharynx as per pre-operative assessment

## 2021-10-01 DIAGNOSIS — K259 Gastric ulcer, unspecified as acute or chronic, without hemorrhage or perforation: Secondary | ICD-10-CM | POA: Diagnosis not present

## 2021-10-01 DIAGNOSIS — K529 Noninfective gastroenteritis and colitis, unspecified: Secondary | ICD-10-CM | POA: Diagnosis not present

## 2021-10-01 DIAGNOSIS — D62 Acute posthemorrhagic anemia: Secondary | ICD-10-CM

## 2021-10-01 DIAGNOSIS — D72829 Elevated white blood cell count, unspecified: Secondary | ICD-10-CM | POA: Diagnosis not present

## 2021-10-01 DIAGNOSIS — K641 Second degree hemorrhoids: Secondary | ICD-10-CM | POA: Diagnosis not present

## 2021-10-01 LAB — COMPREHENSIVE METABOLIC PANEL
ALT: 13 U/L (ref 0–44)
AST: 20 U/L (ref 15–41)
Albumin: 2.1 g/dL — ABNORMAL LOW (ref 3.5–5.0)
Alkaline Phosphatase: 84 U/L (ref 38–126)
Anion gap: 3 — ABNORMAL LOW (ref 5–15)
BUN: 14 mg/dL (ref 8–23)
CO2: 24 mmol/L (ref 22–32)
Calcium: 7.5 mg/dL — ABNORMAL LOW (ref 8.9–10.3)
Chloride: 105 mmol/L (ref 98–111)
Creatinine, Ser: 0.59 mg/dL (ref 0.44–1.00)
GFR, Estimated: >60 mL/min (ref >60)
Glucose, Bld: 101 mg/dL — ABNORMAL HIGH (ref 70–99)
Potassium: 3.5 mmol/L (ref 3.5–5.1)
Sodium: 132 mmol/L — ABNORMAL LOW (ref 135–145)
Total Bilirubin: 2.4 mg/dL — ABNORMAL HIGH (ref 0.3–1.2)
Total Protein: 3.8 g/dL — ABNORMAL LOW (ref 6.5–8.1)

## 2021-10-01 LAB — CBC WITH DIFFERENTIAL/PLATELET
Abs Immature Granulocytes: 0.3 10*3/uL — ABNORMAL HIGH (ref 0.00–0.07)
Basophils Absolute: 0 10*3/uL (ref 0.0–0.1)
Basophils Relative: 0 %
Eosinophils Absolute: 0.7 10*3/uL — ABNORMAL HIGH (ref 0.0–0.5)
Eosinophils Relative: 2 %
HCT: 25.6 % — ABNORMAL LOW (ref 36.0–46.0)
Hemoglobin: 8.3 g/dL — ABNORMAL LOW (ref 12.0–15.0)
Lymphocytes Relative: 0 %
Lymphs Abs: 0 10*3/uL — ABNORMAL LOW (ref 0.7–4.0)
MCH: 29 pg (ref 26.0–34.0)
MCHC: 32.4 g/dL (ref 30.0–36.0)
MCV: 89.5 fL (ref 80.0–100.0)
Metamyelocytes Relative: 1 %
Monocytes Absolute: 0.7 10*3/uL (ref 0.1–1.0)
Monocytes Relative: 2 %
Neutro Abs: 32.4 10*3/uL — ABNORMAL HIGH (ref 1.7–7.7)
Neutrophils Relative %: 95 %
Platelets: 512 10*3/uL — ABNORMAL HIGH (ref 150–400)
RBC: 2.86 MIL/uL — ABNORMAL LOW (ref 3.87–5.11)
RDW: 20.7 % — ABNORMAL HIGH (ref 11.5–15.5)
WBC: 34.1 10*3/uL — ABNORMAL HIGH (ref 4.0–10.5)
nRBC: 3 /100 WBC — ABNORMAL HIGH
nRBC: 3.9 % — ABNORMAL HIGH (ref 0.0–0.2)

## 2021-10-01 LAB — BPAM RBC
Blood Product Expiration Date: 202211302359
Blood Product Expiration Date: 202211302359
Blood Product Expiration Date: 202212032359
Blood Product Expiration Date: 202212192359
ISSUE DATE / TIME: 202211272253
ISSUE DATE / TIME: 202211280117
ISSUE DATE / TIME: 202211280611
ISSUE DATE / TIME: 202211290834
Unit Type and Rh: 5100
Unit Type and Rh: 5100
Unit Type and Rh: 9500
Unit Type and Rh: 9500

## 2021-10-01 LAB — TYPE AND SCREEN
ABO/RH(D): O POS
Antibody Screen: NEGATIVE
Unit division: 0
Unit division: 0
Unit division: 0
Unit division: 0

## 2021-10-01 LAB — PATHOLOGIST SMEAR REVIEW: Path Review: INCREASED

## 2021-10-01 LAB — SURGICAL PATHOLOGY

## 2021-10-01 MED ORDER — SODIUM CHLORIDE 0.9 % IV SOLN
510.0000 mg | Freq: Once | INTRAVENOUS | Status: AC
  Administered 2021-10-01: 510 mg via INTRAVENOUS
  Filled 2021-10-01: qty 17

## 2021-10-01 NOTE — Consult Note (Signed)
[facility] Inpatient Consult   10/01/2021  Courtney [facility] 01/19/1951 [id number]  [facility] CM)   Patient was reviewed for less than 30 days readmission. Patient's primary care office offers embedded chronic care management services with [facility] CM.    Plan: Will continue to follow for progression and disposition.   Of note, [facility] Care Management services does not replace or interfere with any services that are arranged by inpatient case management or social work.   Netta Cedars, MSN, RN [facility] [employer] [phone]  Toll free office [phone]

## 2021-10-01 NOTE — Progress Notes (Signed)
Supervising Physician: Jacqulynn Cadet  Patient Status:  Courtney [facility] - In-pt  Chief Complaint: Abdominal pain; anemia.   Subjective: Patient in bed, sleepy but awake and able to answer questions. States she feels better but weak.   HPI:  Patient has a medical history significant for myelofibrosis, JAK2 V94 F mutation (takes [place]), splenomegaly, cirrhosis, esophageal varices and chronic portal vein thrombosis. A CT abdomen/pelvis 08/26/21 identified an enlarging splenic artery aneurysm and she underwent embolization of the aneurysm in IR 09/18/21. She presented to the [facility] 09/28/21 with complaints of RUQ abdominal pain and was found to have a hemoglobin of 4.0 and significant leukocytosis. Imaging was negative for hemoperitoneum or active bleeding but concerning for possible colitis; positive for infarcted spleen with interval increase in splenomegaly but no findings to suggest active splenic hemorrhage.  She has received multiple units of PRBCs since admission; GI and Hematology/Oncology following. She was taken to the endoscopy suite 09/30/21 for an upper GI and flexible sigmoidoscopy. Upper GI positive for two non-bleeding ulcers; flexible sigmoidoscopy was negative for colitis and was otherwise unremarkable.  Allergies: Patient has no known allergies.  Medications: Prior to Admission medications   Medication Sig Start Date End Date Taking? Authorizing Provider  docusate sodium (COLACE) 100 MG capsule Take 100 mg by mouth See admin instructions. Hs x 10 days   Yes [provider]  furosemide (LASIX) 20 MG tablet Take 1 tablet (20 mg total) by mouth 2 (two) times daily. 07/14/21 09/28/21 Yes Allwardt, Alyssa M, PA-C  ibuprofen (ADVIL) 200 MG tablet Take 400 mg by mouth every 6 (six) hours as needed for headache or moderate pain.   Yes [provider]  JAKAFI 10 MG tablet Take 10 mg by mouth 2 (two) times daily. 06/11/21  Yes [provider]  methylPREDNISolone  (MEDROL DOSEPAK) 4 MG TBPK tablet 6 day taper dose. Patient taking differently: Take 4 mg by mouth See admin instructions. 4,3,5,6,8,6 09/20/21  Yes Jacqualine Mau, NP  moxifloxacin (AVELOX) 400 MG tablet Take 1 tablet (400 mg total) by mouth daily at 8 pm for 12 days. Patient taking differently: Take 400 mg by mouth See admin instructions. At 8 pm x 12 days 09/20/21 10/02/21 Yes Jacqualine Mau, NP  Multiple Vitamin (MULTI VITAMIN PO) Take 1 tablet by mouth daily.   Yes [provider]  promethazine (PHENERGAN) 12.5 MG tablet Take 12.5 mg by mouth every 6 (six) hours as needed for nausea or vomiting.   Yes [provider]  rifaximin (XIFAXAN) 550 MG TABS tablet Take 550 mg by mouth 2 (two) times daily.   Yes [provider]  lactulose (CHRONULAC) 10 GM/15ML solution Take 45 ml daily Patient not taking: Reported on 09/28/2021 09/05/21   Esterwood, Amy S, PA-C     Vital Signs: BP (!) 104/56   Pulse 85   Temp 99.2 F (37.3 C) (Oral)   Resp 14   Ht _0  (1.676 m)   Wt 180 lb (81.6 kg)   SpO2 92%   BMI 29.05 kg/m   Physical Exam Constitutional:      General: She is not in acute distress. Pulmonary:     Effort: Pulmonary effort is normal.  Abdominal:     Tenderness: There is abdominal tenderness.     Comments: LUQ discomfort 4/10  Neurological:     Mental Status: She is alert and oriented to person, place, and time.    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 Multiplanar reconstructed images and MIPs were obtained and reviewed to  evaluate the vascular anatomy. CONTRAST:  117m OMNIPAQUE IOHEXOL 350 MG/ML SOLN COMPARISON:  CT abdomen pelvis 08/26/2021, IR embolization 09/18/2021 FINDINGS: VASCULAR No active extravasation of intravenous contrast. Aorta: Mild atherosclerotic plaque. Normal caliber aorta without aneurysm, dissection, vasculitis or significant stenosis. Celiac: Patent without evidence of aneurysm, dissection, vasculitis or significant stenosis. The splenic artery/splenic hilar vessel are not visualized due to marked streak artifact from the splenic vasculature coiling. SMA: Patent without evidence of aneurysm, dissection, vasculitis or significant stenosis. Renals: Both renal arteries are patent without evidence of aneurysm, dissection, vasculitis, fibromuscular dysplasia or significant stenosis. IMA: Patent without evidence of aneurysm, dissection, vasculitis or significant stenosis. Inflow: Patent without evidence of aneurysm, dissection, vasculitis or significant stenosis. Proximal Outflow: Bilateral common femoral and visualized portions of the superficial and profunda femoral arteries are patent without evidence of aneurysm, dissection, vasculitis or significant stenosis. Veins: Marked left upper quadrant venous collaterals. The hepatic veins are patent. Chronic portal vein occlusion. Review of the MIP images confirms the above findings. NON-VASCULAR Lower chest: No artery calcifications.  No acute abnormality. Hepatobiliary: Redemonstration of right hepatic lobe hemangioma. No new focal liver abnormality. Gallbladder sludge versus gallstone within the gallbladder lumen. No gallbladder wall thickening or pericholecystic fluid. No biliary dilatation. Pancreas: No focal lesion. Normal pancreatic contour. No surrounding inflammatory changes. No main pancreatic ductal dilatation. Spleen: Interval increase in size of an enlarged spleen measuring up to 18.5 cm (14 cm) with large splenic infarction involving greater than 75% of the  splenic parenchyma. No definite active hemorrhage of the splenic parenchyma noted. Adrenals/Urinary Tract: No adrenal nodule bilaterally. Bilateral kidneys enhance symmetrically. No hydronephrosis. No hydroureter. The urinary bladder is unremarkable. Stomach/Bowel: Stomach is within normal limits. No evidence of bowel wall thickening or dilatation. Appendix appears normal. Lymphatic: No lymphadenopathy Reproductive: Intramural pericentimeter uterine fibroid. Uterus and bilateral adnexa are unremarkable. Other: Trace simple free fluid within the pelvis. No intraperitoneal free gas. No organized fluid collection. Musculoskeletal: Subcutaneus soft tissue edema more prominent along the upper flanks. No large hematoma formation. No suspicious lytic or blastic osseous lesions. No acute displaced fracture. IMPRESSION: VASCULAR 1. Markedly limited evaluation of the splenic hilum vasculature due to marked metallic artifact originating from the vascular coiling. No pneumoperitoneum suggest active intraperitoneal bleed. 2. No acute vascular abnormality. 3. 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 NON-VASCULAR 1. Interval development of descending colitis. No CT evidence of definite gastrointestinal bleed. 2. 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 No findings to suggest definite active splenic hemorrhage. 3. Gallbladder sludge versus gallstone within the gallbladder lumen. 4. Intramural uterine fibroid. 5. Trace nonspecific simple free fluid within the pelvis. These results were called by telephone at the time of interpretation on 09/28/2021 at 11:15 pm to provider Dr. HAlmyra Free who verbally acknowledged these results. Electronically Signed   By: MIven FinnM.D.   On: 09/28/2021 23:23    Labs:  CBC: Recent Labs    09/28/21 1933 09/29/21 0939 09/29/21 1430 09/30/21 0056 10/01/21 0351  WBC 30.5* 31.1*  --  35.3* 34.1*  HGB 4.0* 7.8* 7.1* 7.0* 8.3*  HCT 13.6* 23.8* 22.9* 22.4* 25.6*  PLT 1,026* 838*  --  750* 512*    COAGS: Recent Labs    07/02/21 1538 09/03/21 1614 09/18/21 1140  INR 1.8* 1.5* 1.7*    BMP: Recent Labs    09/20/21 0053 09/28/21 1933 09/30/21 0056 10/01/21 0351  NA 136 140 137 132*  K 4.7 4.3 3.3* 3.5  CL 109 111 108 105  CO2 22 21* 23 24  GLUCOSE 150* 112* 91 101*  BUN _0 CALCIUM 8.2* 7.9* 7.6* 7.5*  CREATININE 0.77 0.67 0.62 0.59  GFRNONAA >60 >60 >60 >60    LIVER FUNCTION TESTS: Recent Labs    09/20/21 0053 09/28/21 1933 09/30/21 0056 10/01/21 0351  BILITOT 2.1* 2.0* 3.3* 2.4*  AST 54* _1 ALT _2 ALKPHOS 66 83 91 84  PROT 4.4* 4.0* 3.8* 3.8*  ALBUMIN 2.7* 2.4* 2.2* 2.1*    Assessment and Plan:  Abdominal Pain; Anemia; S/p coil embolization of splenic artery 09/18/21: Patient received an additional one unit PRBC 09/30/21 and has a hemoglobin of 8.3 this morning. WBC count is 34.1. Two non-bleeding ulcers identified on Upper GI yesterday (biopsied) and she has been started on protonix and carafate. No colitis identified on flexible sigmoidoscopy. Fecal occult blood screen pending. Oncology following - Jakafi being held as inpatient and additional labs ordered (iron panel, ferritin, ESR) to assess for thrombocytosis related to possible iron deficiency.   IR recommends to transfuse PRBCs as needed. Other plans per primary/consulting teams.  IR remains available as needed. Outpatient follow up with Dr. Laurence Ferrari scheduled for 10/15/21.  Please call IR with any questions/concerns.   Electronically Signed: Soyla Dryer, AGACNP-BC [phone] 10/01/2021, 11:10 AM   I spent a total of 15 Minutes at the the patient's bedside AND on the patient's [facility] floor or unit, greater than 50% of which was counseling/coordinating care for abdominal pain; anemia

## 2021-10-01 NOTE — Progress Notes (Addendum)
Attending physician's note   I have taken an interval history, reviewed the chart and examined the patient. I agree with the Advanced Practitioner's note, impression, and recommendations as outlined.   Clinically improving and tolerating full p.o. intake without issue.  H/H with good response after most recent 1 unit PRBC transfusion.  Currently 8.3/25.6.  PLT appropriately downtrending and slight downtrend in WBC as well.  - Continue high-dose PPI as outlined - Continue Carafate as outlined - Labs do demonstrate iron deficiency.  Would benefit from IV iron infusion prior to discharge.  Additional evaluation/treatment per Hematology - If possible, please consult Pharmacy service for assistance with medication reconciliation.  She reports having multiple prescriptions from multiple facilities and not sure which medications overlap/duplicate. - To follow-up with Dr. Hilarie Fredrickson as needed as outpatient - GI service will sign off at this time.  Please do not hesitate to contact us with additional questions or concerns.  [place], DO, FACG [phone] office                Daily Rounding Note  10/01/2021, 10:41 AM  LOS: 1 day   SUBJECTIVE:   Chief complaint:    anemia  Feels tired.  Some L upper quadrant tenderness but no pain there with moving about.  Hungry.  OBJECTIVE:         Vital signs in last 24 hours:    Temp:  [98.7 F (37.1 C)-99.3 F (37.4 C)] 99.2 F (37.3 C) (11/30 0757) Pulse Rate:  [71-85] 85 (11/30 0757) Resp:  [12-17] 14 (11/30 0757) BP: (94-120)/(37-68) 104/56 (11/30 0757) SpO2:  [92 %-100 %] 92 % (11/30 0757) Last BM Date: 09/28/21 Filed Weights   09/28/21 1812  Weight: 81.6 kg   General: Tired, looks unwell but not toxic. Heart: RRR. Chest: Clear bilaterally without labored breathing or cough. Abdomen: Soft.  There is tenderness in the epigastrium and left upper quadrant.  Bowel sounds  active. Extremities: No CCE. Neuro/Psych: Very slightly lethargic but alert during our conversation.  Appropriate.  Not confused.  Intake/Output from previous day: 11/29 0701 - 11/30 0700 In: 1715.9 [P.O.:840; I.V.:319.2; Blood:456.7; IV Piggyback:100] Out: -   Intake/Output this shift: No intake/output data recorded.  Lab Results: Recent Labs    09/29/21 0939 09/29/21 1430 09/30/21 0056 10/01/21 0351  WBC 31.1*  --  35.3* 34.1*  HGB 7.8* 7.1* 7.0* 8.3*  HCT 23.8* 22.9* 22.4* 25.6*  PLT 838*  --  750* 512*   BMET Recent Labs    09/28/21 1933 09/30/21 0056 10/01/21 0351  NA 140 137 132*  K 4.3 3.3* 3.5  CL 111 108 105  CO2 21* 23 24  GLUCOSE 112* 91 101*  BUN 18 15 14   CREATININE 0.67 0.62 0.59  CALCIUM 7.9* 7.6* 7.5*   LFT Recent Labs    09/28/21 1933 09/30/21 0056 10/01/21 0351  PROT 4.0* 3.8* 3.8*  ALBUMIN 2.4* 2.2* 2.1*  AST 28 23 20   ALT 16 14 13   ALKPHOS 83 91 84  BILITOT 2.0* 3.3* 2.4*  BILIDIR 0.4*  --   --   IBILI 1.6*  --   --    PT/INR No results for input(s): LABPROT, INR in the last 72 hours. Hepatitis Panel No results for input(s): HEPBSAG, HCVAB, HEPAIGM, HEPBIGM in the last 72 hours.  Studies/Results: No results found.  Scheduled Meds: . sodium chloride   Intravenous Once  . diphenhydrAMINE  25 mg Oral Once  . pantoprazole  40 mg Oral  BID  . sucralfate  1 g Oral TID WC & HS   Continuous Infusions: . sodium chloride Stopped (09/28/21 2055)  . 0.9 % NaCl with KCl 20 mEq / L 75 mL/hr at 10/01/21 0524  . cefTRIAXone (ROCEPHIN)  IV 2 g (10/01/21 9379)  . metronidazole 500 mg (10/01/21 0525)   PRN Meds:.acetaminophen **OR** acetaminophen, HYDROmorphone (DILAUDID) injection, ondansetron   ASSESMENT:      Abdominal pain.  Dark stool.  CT angio with descending colitis.  09/30/2021 EGD: None bleeding gastric ulcers, biopsied.  No esophageal or gastric varices.  09/30/2021 flexible sigmoidoscopy: no mucosal abnormalities to the distal  descending colon (no confirmation of colitis), nonbleeding internal hemorrhoids.  12/2018 colonoscopy: 5 mm inflammatory polyp, nonbleeding small internal hemorrhoids     Cryptogenic cirrhosis.     Portal vein thrombosis.  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 no anticoagulation in place.     Myelofibrosis.  Leukocytosis.  On Rocephin, Flagyl with plans to transition to Augmentin.  Anemia. HGb improved.  4 PRBCs thus far.     PLAN     GI signing off for now but will fup path reports.  Dr Hilarie Fredrickson available for outpt GI needs but no plans for outpt GI fup at present.  Continue Protonix 40 mg po bid for 6 weeks, then drop to once daily.  Carafate 1 g qid for 4 weeks.  If patient requires anticoagulation, okay to start this.    Azucena Freed  10/01/2021, 10:41 AM Phone [phone]

## 2021-10-01 NOTE — Progress Notes (Signed)
PROGRESS NOTE    Courtney Buckley DOB: Dec 06, 1950 DOA: 09/28/2021 PCP: Fredirick Lathe, PA-C   Chief Complaint  Patient presents with   Abdominal Pain  Brief Narrative/Hospital Course: [place], 70 y.o. 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 Underwent 2 unit PRBC total hemoglobin has stabilized.  Underwent EGD that showed ulcer and also had sigmoidoscopy.  Seen by GI hematology oncology IR.   Subjective: Seen and examined this morning complains of very poor oral intake 825% complaint some gas no bowel movement passing flatus no vomiting but nausea, tenderness on the left side of the abdomen. Overnight afebrile.  Assessment & Plan:  Abdominal pain with dark stool Descending colitis and CT angio: Underwent EGD 11/29 no varices or bleeding ulcer.  Underwent flex sigmoidoscopy no confirmation of the colitis, nonbleeding internal hemorrhoids found.  Managed with Rocephin/Flagyl.  Seen by GI, now on diet but with poor intake advance diet as tolerated-2 regular diet.  Continue PPI 40 twice daily x6-week, Carafate 1 g 4 times daily x4-week follow-up with GI.  Severe anemia of 4 g ?  Upper GI bleed: Underwent EGD sigmoidoscopy.Baseline hemoglobin 8 g, S/P  3 units PRBC total.?UGI  bleeding-given dark unformed stool but EGD 2 ulcers 1 cm and 0.5 cm deep and cratering likely etiology of blood loss and anemia also in the setting of splenomegaly/splenic infarct. hb has stabilized. Recent Labs  Lab 09/28/21 1933 09/29/21 0939 09/29/21 1430 09/30/21 0056 10/01/21 0351  HGB 4.0* 7.8* 7.1* 7.0* 8.3*  HCT 13.6* 23.8* 22.9* 22.4* 25.6*    Severe leukocytosis Severe thrombocytosis: Baseline with a stable WBC count 24K 8 days PTA.  Thrombocytosis decreasing, still with leukocytosis significantly elevated.  Patient is afebrile.  Wondering if it is in the setting of recent splenic artery aneurysm embolization with now splenic infarct contributing to reactive leukocytosis. On rocephin/Flagyl. Recent Labs  Lab 09/29/21 0939 09/29/21 1430 09/30/21 0056 10/01/21 0351  HGB 7.8* 7.1* 7.0* 8.3*  HCT 23.8* 22.9* 22.4* 25.6*  WBC 31.1*  --  35.3* 34.1*  PLT 838*  --  750* 512*   History of myelofibrosis and Jak 2 mutation: Holding ruxolitinib in the inpatient setting, follow-up with [facility] oncology.  Seen by hematology oncology  Cryptogenic cirrhosis Portal vein thrombosis Splenic artery aneurysm S/P embolized 11/17 at [facility]: Not on anticoagulation for portal vein thrombosis, at this time severely anemic and high risk.  CT suspender percent involvement of organ with infarcted spleen.  Seen by GI.  Grade II internal hemorrhoids-op f/u  DVT prophylaxis: SCDs Start: 09/29/21 0345 Code Status:   Code Status: Full Code Family Communication: plan of care discussed with patient at bedside. Status is: Inpatient Remains inpatient appropriate because: For ongoing management of anemia poor oral intake and abdomen pain Disposition: Currently not medically stable for discharge. Anticipated Disposition: Home   Objective: Vitals last 24 hrs: Vitals:  09/30/21 1159 09/30/21 1500 09/30/21 2041 10/01/21 0757  BP: (!) 101/37 (!) 103/48 120/68 (!) 104/56  Pulse:  82 85 85   Resp: 12 16 16 14   Temp:  99.3 F (37.4 C) 98.9 F (37.2 C) 99.2 F (37.3 C)  TempSrc:  Oral Oral Oral  SpO2: 99% 96% 96% 92%  Weight:      Height:       Weight change:   Intake/Output Summary (Last 24 hours) at 10/01/2021 1124 Last data filed at 10/01/2021 0700 Gross per 24 hour  Intake 1715.85 ml  Output --  Net 1715.85 ml   Net IO Since Admission: 3,547.2 mL [10/01/21 1124]   Physical Examination: General exam: AA0x3, weak,older than stated age. HEENT:Oral mucosa moist, Ear/Nose WNL grossly,dentition normal. Respiratory system: B/l cleear BS, no use of accessory muscle, non tender. Cardiovascular system: S1 & S2 +,No JVD. Gastrointestinal system: Abdomen soft, tender left side of abdomen,ND, BS+. Nervous System:Alert, awake, moving extremities. Extremities: edema none, distal peripheral pulses palpable.  Skin: No rashes, no icterus. MSK: Normal muscle bulk, tone, power.  Medications reviewed:  Scheduled Meds:  sodium chloride   Intravenous Once   diphenhydrAMINE  25 mg Oral Once   pantoprazole  40 mg Oral BID   sucralfate  1 g Oral TID WC & HS   Continuous Infusions:  sodium chloride Stopped (09/28/21 2055)   0.9 % NaCl with KCl 20 mEq / L 75 mL/hr at 10/01/21 0524   cefTRIAXone (ROCEPHIN)  IV 2 g (10/01/21 7425)   metronidazole 500 mg (10/01/21 0525)   Diet Order             Diet regular Room service appropriate? Yes; Fluid consistency: Thin  Diet effective now                 Weight change:   Wt Readings from Last 3 Encounters:  09/28/21 81.6 kg  09/18/21 81.6 kg  09/03/21 83 kg     Consultants:see note  Procedures:see note Antimicrobials: Anti-infectives (From admission, onward)    Start     Dose/Rate Route Frequency Ordered Stop   10/01/21 0600  cefTRIAXone (ROCEPHIN) 2 g in sodium chloride 0.9 % 100 mL IVPB        2 g 200 mL/hr over 30 Minutes Intravenous Daily 09/30/21 1242     09/29/21 0430  cefTRIAXone (ROCEPHIN) 1 g in sodium  chloride 0.9 % 100 mL IVPB  Status:  Discontinued        1 g 200 mL/hr over 30 Minutes Intravenous Daily 09/29/21 0421 09/30/21 1242   09/29/21 0430  metroNIDAZOLE (FLAGYL) IVPB 500 mg        500 mg 100 mL/hr over 60 Minutes Intravenous Every 8 hours 09/29/21 0422        Culture/Microbiology    Component Value Date/Time   SDES BLOOD RIGHT ANTECUBITAL 09/29/2021 0939   SPECREQUEST  09/29/2021 0939    BOTTLES DRAWN AEROBIC AND ANAEROBIC Blood Culture adequate volume   CULT  09/29/2021 0939    NO GROWTH 2 DAYS Performed at [facility], [place] [address] [place], [place] [address]    REPTSTATUS PENDING 09/29/2021 7564    Other culture-see note  Unresulted Labs (From admission, onward)     Start     Ordered   10/01/21 0351  Pathologist smear review  Once,   AD        10/01/21 0351   09/29/21 0346  Occult blood card to lab, stool  Once,   R        09/29/21 0346   09/28/21 1933  Pathologist smear review  Once,   R        09/28/21 1933          Data Reviewed: I have personally reviewed following labs and imaging studies CBC: Recent Labs  Lab 09/28/21 1933 09/29/21 0939 09/29/21 1430 09/30/21 0056 10/01/21 0351  WBC 30.5* 31.1*  --  35.3* 34.1*  NEUTROABS 24.7* 28.3*  --  30.3* 32.4*  HGB 4.0* 7.8* 7.1* 7.0* 8.3*  HCT 13.6* 23.8* 22.9* 22.4* 25.6*  MCV 97.1 90.5  --  91.4 89.5  PLT 1,026* 838*  --  750* 536*   Basic Metabolic Panel: Recent Labs  Lab 09/28/21 1933 09/30/21 0056 10/01/21 0351  NA 140 137 132*  K 4.3 3.3* 3.5  CL 111 108 105  CO2 21* 23 24  GLUCOSE 112* 91 101*  BUN 18 15 14   CREATININE 0.67 0.62 0.59  CALCIUM 7.9* 7.6* 7.5*   GFR: Estimated Creatinine Clearance: 69.4 mL/min (by C-G formula based on SCr of 0.59 mg/dL). Liver Function Tests: Recent Labs  Lab 09/28/21 1933 09/30/21 0056 10/01/21 0351  AST 28 23 20   ALT 16 14 13   ALKPHOS 83 91 84  BILITOT 2.0* 3.3* 2.4*  PROT 4.0* 3.8* 3.8*  ALBUMIN 2.4* 2.2* 2.1*   Recent Labs   Lab 09/28/21 1933  LIPASE 22   No results for input(s): AMMONIA in the last 168 hours. Coagulation Profile: No results for input(s): INR, PROTIME in the last 168 hours. Cardiac Enzymes: No results for input(s): CKTOTAL, CKMB, CKMBINDEX, TROPONINI in the last 168 hours. BNP (last 3 results) No results for input(s): PROBNP in the last 8760 hours. HbA1C: No results for input(s): HGBA1C in the last 72 hours. CBG: No results for input(s): GLUCAP in the last 168 hours. Lipid Profile: No results for input(s): CHOL, HDL, LDLCALC, TRIG, CHOLHDL, LDLDIRECT in the last 72 hours. Thyroid Function Tests: No results for input(s): TSH, T4TOTAL, FREET4, T3FREE, THYROIDAB in the last 72 hours. Anemia Panel: Recent Labs    09/30/21 0056  FERRITIN 105  TIBC 185*  IRON 11*   Sepsis Labs: Recent Labs  Lab 09/29/21 0604  PROCALCITON 0.13  LATICACIDVEN 1.0    Recent Results (from the past 240 hour(s))  Resp Panel by RT-PCR (Flu A&B, Covid) Nasopharyngeal Swab     Status: None   Collection Time: 09/28/21  9:41 PM   Specimen: Nasopharyngeal Swab; Nasopharyngeal(NP) swabs in vial transport medium  Result Value Ref Range Status   SARS Coronavirus 2 by RT PCR NEGATIVE NEGATIVE Final    Comment: (NOTE) SARS-CoV-2 target nucleic acids are NOT DETECTED.  The SARS-CoV-2 RNA is generally detectable in upper respiratory specimens during the acute phase of infection. The lowest concentration of SARS-CoV-2 viral copies this assay can detect is 138 copies/mL. A negative result does not preclude SARS-Cov-2 infection and should not be used as the sole basis for treatment or other patient management decisions. A negative result may occur with  improper specimen collection/handling, submission of specimen other than nasopharyngeal swab, presence of viral mutation(s) within the areas targeted by this assay, and inadequate number of viral copies(<138 copies/mL). A negative result must be combined  with clinical observations, patient history, and epidemiological information. The expected result is Negative.  Fact Sheet for Patients:  EntrepreneurPulse.com.au  Fact Sheet for Healthcare Providers:  IncredibleEmployment.be  This test is no t yet approved or cleared  by the [place] and  has been authorized for detection and/or diagnosis of SARS-CoV-2 by FDA under an Emergency Use Authorization (EUA). This EUA will remain  in effect (meaning this test can be used) for the duration of the COVID-19 declaration under Section 564(b)(1) of the Act, 21 U.S.C.section 360bbb-3(b)(1), unless the authorization is terminated  or revoked sooner.       Influenza A by PCR NEGATIVE NEGATIVE Final   Influenza B by PCR NEGATIVE NEGATIVE Final    Comment: (NOTE) The Xpert Xpress SARS-CoV-2/FLU/RSV plus assay is intended as an aid in the diagnosis of influenza from Nasopharyngeal swab specimens and should not be used as a sole basis for treatment. Nasal washings and aspirates are unacceptable for Xpert Xpress SARS-CoV-2/FLU/RSV testing.  Fact Sheet for Patients: EntrepreneurPulse.com.au  Fact Sheet for Healthcare Providers: IncredibleEmployment.be  This test is not yet approved or cleared by the [place] FDA and has been authorized for detection and/or diagnosis of SARS-CoV-2 by FDA under an Emergency Use Authorization (EUA). This EUA will remain in effect (meaning this test can be used) for the duration of the COVID-19 declaration under Section 564(b)(1) of the Act, 21 U.S.C. section 360bbb-3(b)(1), unless the authorization is terminated or revoked.  Performed at [facility], [place] [address] [place], [place] [address]   Culture, blood (routine x 2)     Status: None (Preliminary result)   Collection Time: 09/29/21  4:51 AM   Specimen: BLOOD  Result Value Ref Range Status   Specimen Description  BLOOD LEFT ANTECUBITAL  Final   Special Requests   Final    BOTTLES DRAWN AEROBIC AND ANAEROBIC Blood Culture adequate volume   Culture   Final    NO GROWTH 2 DAYS Performed at [facility], [place] [address] [place], [place] [address]    Report Status PENDING  Incomplete  Culture, blood (routine x 2)     Status: None (Preliminary result)   Collection Time: 09/29/21  9:39 AM   Specimen: BLOOD  Result Value Ref Range Status   Specimen Description BLOOD RIGHT ANTECUBITAL  Final   Special Requests   Final    BOTTLES DRAWN AEROBIC AND ANAEROBIC Blood Culture adequate volume   Culture   Final    NO GROWTH 2 DAYS Performed at [facility], [place] [address], [place], [place] [address]    Report Status PENDING  Incomplete     Radiology Studies: No results found.   LOS: 1 day   Antonieta Pert, MD [facility]  10/01/2021, 11:24 AM

## 2021-10-02 ENCOUNTER — Encounter (HOSPITAL_COMMUNITY): Payer: Self-pay | Admitting: Gastroenterology

## 2021-10-02 DIAGNOSIS — K529 Noninfective gastroenteritis and colitis, unspecified: Secondary | ICD-10-CM | POA: Diagnosis not present

## 2021-10-02 LAB — CBC
HCT: 26.1 % — ABNORMAL LOW (ref 36.0–46.0)
Hemoglobin: 8.3 g/dL — ABNORMAL LOW (ref 12.0–15.0)
MCH: 28.7 pg (ref 26.0–34.0)
MCHC: 31.8 g/dL (ref 30.0–36.0)
MCV: 90.3 fL (ref 80.0–100.0)
Platelets: 469 10*3/uL — ABNORMAL HIGH (ref 150–400)
RBC: 2.89 MIL/uL — ABNORMAL LOW (ref 3.87–5.11)
RDW: 21.1 % — ABNORMAL HIGH (ref 11.5–15.5)
WBC: 32.8 10*3/uL — ABNORMAL HIGH (ref 4.0–10.5)
nRBC: 3 % — ABNORMAL HIGH (ref 0.0–0.2)

## 2021-10-02 MED ORDER — SUCRALFATE 1 GM/10ML PO SUSP: 1.0000 g | Freq: Three times a day (TID) | ORAL | 0 refills | Status: DC

## 2021-10-02 MED ORDER — PANTOPRAZOLE SODIUM 40 MG PO TBEC: 40.0000 mg | DELAYED_RELEASE_TABLET | Freq: Two times a day (BID) | ORAL | 0 refills | Status: DC

## 2021-10-02 NOTE — Progress Notes (Signed)
    Durable Medical Equipment  (From admission, onward)           Start     Ordered   Unscheduled  For home use only DME lightweight manual wheelchair with seat cushion  Once       Comments: Patient suffers from colitis/gi blreed which impairs their ability to perform daily activities like bathing and dressing in the home.  A walker will not resolve  issue with performing activities of daily living. A wheelchair will allow patient to safely perform daily activities. Patient is not able to propel themselves in the home using a standard weight wheelchair due to general weakness. Patient can self propel in the lightweight wheelchair. Length of need 6 months . Accessories: elevating leg rests (ELRs), wheel locks, extensions and anti-tippers.   10/02/21 1240

## 2021-10-02 NOTE — Plan of Care (Signed)

## 2021-10-02 NOTE — Discharge Summary (Signed)
Physician Discharge Summary  Courtney Buckley NKN:[id number] DOB: 09/03/1951 DOA: 09/28/2021  PCP: Fredirick Lathe, PA-C  Admit date: 09/28/2021 Discharge date: 10/02/2021  Admitted From: HOME Disposition:  HOME  Recommendations for Outpatient Follow-up:  Follow up with PCP , gi, oncololgy in 1-2 weeks Please obtain BMP/CBC in one week Please follow up on the following pending results:  Home Health:no  Equipment/Devices: none  Discharge Condition: Stable Code Status:   Code Status: Full Code Diet recommendation:  Diet Order             Diet regular Room service appropriate? Yes; Fluid consistency: Thin  Diet effective now                    Brief/Interim Summary: 70 y.o. 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 Underwent 2 unit PRBC total hemoglobin has stabilized.  Underwent EGD that showed ulcer and also had sigmoidoscopy.  Seen by GI hematology oncology IR At the time she is tolerating diet GI ONCOLOGY and IR has signed off. She is passing gas has some abdominal discomfort on the left side suspect in the setting of her  recent splenic artery aneurysm.  Hemoglobin has stabilized no evidence of active bleeding at this time. Patient received Feraheme x1 per  oncology.  Discussed with GI at this time okay to discharge home does not need anticoagulation for chronic portal vein thrombosis. Patient will continue on oral moxifloxacin she has 5 doses remaining, she was on ceftriaxone and Flagyl while here discussed with GI for antibiotic and discharge medication recommendation.  She will continue her lactulose rifaximin.  Advised check CBC BMP in a week and follow-up with GI,PCP  Discharge Diagnoses:  Abdominal pain with dark stool Descending colitis and CT angio: Underwent EGD 11/29 no varices or bleeding ulcer.  Underwent flex sigmoidoscopy no confirmation of the colitis, nonbleeding internal hemorrhoids found.  Managed with Rocephin/Flagyl.  Seen by GI, now on diet but with poor intake advance diet as tolerated-2 regular diet.  Continue PPI 40 twice daily x6-week, Carafate 1 g 4 times daily x4-week follow-up with GI.  Severe anemia of 4 g Suspected Upper GI bleed: Underwent EGD sigmoidoscopy.Baseline hemoglobin 8 g, S/P  3 units PRBC total.?UGI bleeding-given dark unformed stool but EGD 2 ulcers 1 cm and 0.5 cm deep and cratering likely etiology of blood loss and anemia also in the setting of splenomegaly. hb has stabilized. Recent Labs  Lab 09/29/21 0939 09/29/21 1430 09/30/21 0056 10/01/21 0351 10/02/21 0059  HGB 7.8* 7.1* 7.0* 8.3* 8.3*  HCT 23.8* 22.9* 22.4* 25.6* 26.1*    Severe leukocytosis Severe thrombocytosis: Baseline with a stable WBC count 24K 8 days PTA.  Thrombocytosis decreasing, still with leukocytosis significantly elevated.  Patient is afebrile.  Wondering if it is in the setting of recent splenic artery aneurysm embolization with now splenic infarct contributing to reactive  leukocytosis.  She was also on Medrol Dosepak prior to admission which could be contributing. on rocephin/Flagyl here discussed with GI okay to resume her moxifloxacin upon discharge for 5 more days. Recent Labs  Lab 09/30/21 0056 10/01/21 0351  10/02/21 0059  HGB 7.0* 8.3* 8.3*  HCT 22.4* 25.6* 26.1*  WBC 35.3* 34.1* 32.8*  PLT 750* 512* 469*   History of myelofibrosis and Jak 2 mutation: Holding ruxolitinib in the inpatient setting, follow-up with [facility] oncology.  Follow-up with him to resume her meds. 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  Hopeful and would like to go home today  Discharge Exam: Vitals:   10/02/21 0453 10/02/21 0734  BP: (!) 120/56 118/65  Pulse: 79 83  Resp: 18 15  Temp: 98.7 F (37.1 C) 98.5 F (36.9 C)  SpO2: 94% 93%   General: Pt is alert, awake, not in acute distress Cardiovascular: RRR, S1/S2 +, no rubs, no gallops Respiratory: CTA bilaterally, no wheezing, no rhonchi Abdominal: Soft, NT, ND, bowel sounds + Extremities: no edema, no cyanosis  Discharge Instructions  Discharge Instructions     Discharge instructions   Complete by: As directed    Check CBC and BMP in 5 to 7 days from PCP Follow-up with your GI doctor, oncology and PCP in a week or 2  Please call call MD or return to ER for similar or worsening recurring problem that brought you to hospital or if any fever,nausea/vomiting,abdominal pain, uncontrolled pain, chest pain,  shortness of breath or any other alarming symptoms.  Please follow-up your doctor as instructed in a week time and call the office for appointment.  Please avoid alcohol, smoking, or any other illicit substance and maintain healthy habits including taking your regular medications as prescribed.  You were cared for by a hospitalist during your  hospital stay. If you have any questions about your discharge medications or the care you received while you were in the hospital after you are discharged, you can call the unit and ask to speak with the hospitalist on call if the hospitalist that took care of you is not available.  Once you are discharged, your primary care physician will handle any further medical issues. Please note that NO REFILLS for any discharge medications will be authorized once you are discharged, as it is imperative that you return to your primary care physician (or establish a relationship with a primary care physician if you do not have one) for your aftercare needs so that they can reassess your need for medications and monitor your lab values   Increase activity slowly   Complete by: As directed       Allergies as of 10/02/2021   No Known Allergies      Medication List     STOP taking these medications    HYDROcodone-acetaminophen 10-325 MG tablet Commonly known as: NORCO   methylPREDNISolone 4 MG Tbpk tablet Commonly known as: MEDROL DOSEPAK       TAKE these medications    docusate sodium 100 MG capsule Commonly known as: COLACE Take 100 mg by mouth See admin instructions. Hs x 10  days   furosemide 20 MG tablet Commonly known as: LASIX Take 1 tablet (20 mg total) by mouth 2 (two) times daily.   ibuprofen 200 MG tablet Commonly known as: ADVIL Take 400 mg by mouth every 6 (six) hours as needed for headache or moderate pain.   Jakafi 10 MG tablet Generic drug: ruxolitinib phosphate Take 10 mg by mouth 2 (two) times daily.   lactulose 10 GM/15ML solution Commonly known as: CHRONULAC Take 45 ml daily   moxifloxacin 400 MG tablet Commonly known as: AVELOX Take 1 tablet (400 mg total) by mouth daily at 8 pm for 12 days. What changed:  when to take this additional instructions   MULTI VITAMIN PO Take 1 tablet by mouth daily.   pantoprazole 40 MG tablet Commonly known as:  PROTONIX Take 1 tablet (40 mg total) by mouth 2 (two) times daily for 60 doses. After 6 weeks change to once a day   promethazine 12.5 MG tablet Commonly known as: PHENERGAN Take 12.5 mg by mouth every 6 (six) hours as needed for nausea or vomiting.   rifaximin 550 MG Tabs tablet Commonly known as: XIFAXAN Take 550 mg by mouth 2 (two) times daily.   sucralfate 1 GM/10ML suspension Commonly known as: CARAFATE Take 10 mLs (1 g total) by mouth 4 (four) times daily -  with meals and at bedtime for 28 days.        Follow-up Information     Allwardt, Alyssa M, PA-C .   Specialty: Physician Assistant Contact information: [place] [address] [phone]         Jerene Bears, MD Follow up in 1 week(s).   Specialty: Gastroenterology Contact information: [address] [address] [phone]                No Known Allergies  The results of significant diagnostics from this hospitalization (including imaging, microbiology, ancillary and laboratory) are listed below for reference.    Microbiology: Recent Results (from the past 240 hour(s))  Resp Panel by RT-PCR (Flu A&B, Covid) Nasopharyngeal Swab     Status: None   Collection Time: 09/28/21  9:41 PM   Specimen: Nasopharyngeal Swab; Nasopharyngeal(NP) swabs in vial transport medium  Result Value Ref Range Status   SARS Coronavirus 2 by RT PCR NEGATIVE NEGATIVE Final    Comment: (NOTE) SARS-CoV-2 target nucleic acids are NOT DETECTED.  The SARS-CoV-2 RNA is generally detectable in upper respiratory specimens during the acute phase of infection. The lowest concentration of SARS-CoV-2 viral copies this assay can detect is 138 copies/mL. A negative result does not preclude SARS-Cov-2 infection and should not be used as the sole basis for treatment or other patient management decisions. A negative result may occur with  improper specimen collection/handling, submission of specimen  other than nasopharyngeal swab, presence of viral mutation(s) within the areas targeted by this assay, and inadequate number of viral copies(<138 copies/mL). A negative result must be combined with clinical observations, patient history, and epidemiological information. The expected result is Negative.  Fact Sheet for Patients:  EntrepreneurPulse.com.au  Fact Sheet for Healthcare Providers:  IncredibleEmployment.be  This test is no t yet approved or cleared by the [place] FDA and  has been authorized for detection and/or diagnosis of SARS-CoV-2 by FDA under an Emergency Use Authorization (EUA). This EUA will remain  in effect (meaning this test can be used) for the duration of the COVID-19 declaration under Section 564(b)(1) of the Act,  21 U.S.C.section 360bbb-3(b)(1), unless the authorization is terminated  or revoked sooner.       Influenza A by PCR NEGATIVE NEGATIVE Final   Influenza B by PCR NEGATIVE NEGATIVE Final    Comment: (NOTE) The Xpert Xpress SARS-CoV-2/FLU/RSV plus assay is intended as an aid in the diagnosis of influenza from Nasopharyngeal swab specimens and should not be used as a sole basis for treatment. Nasal washings and aspirates are unacceptable for Xpert Xpress SARS-CoV-2/FLU/RSV testing.  Fact Sheet for Patients: EntrepreneurPulse.com.au  Fact Sheet for Healthcare Providers: IncredibleEmployment.be  This test is not yet approved or cleared by the [place] FDA and has been authorized for detection and/or diagnosis of SARS-CoV-2 by FDA under an Emergency Use Authorization (EUA). This EUA will remain in effect (meaning this test can be used) for the duration of the COVID-19 declaration under Section 564(b)(1) of the Act, 21 U.S.C. section 360bbb-3(b)(1), unless the authorization is terminated or revoked.  Performed at [facility], [place] [address] [place],  [place] [address]   Culture, blood (routine x 2)     Status: None (Preliminary result)   Collection Time: 09/29/21  4:51 AM   Specimen: BLOOD  Result Value Ref Range Status   Specimen Description BLOOD LEFT ANTECUBITAL  Final   Special Requests   Final    BOTTLES DRAWN AEROBIC AND ANAEROBIC Blood Culture adequate volume   Culture   Final    NO GROWTH 3 DAYS Performed at [facility], [place] [address] [place], [place] [address]    Report Status PENDING  Incomplete  Culture, blood (routine x 2)     Status: None (Preliminary result)   Collection Time: 09/29/21  9:39 AM   Specimen: BLOOD  Result Value Ref Range Status   Specimen Description BLOOD RIGHT ANTECUBITAL  Final   Special Requests   Final    BOTTLES DRAWN AEROBIC AND ANAEROBIC Blood Culture adequate volume   Culture   Final    NO GROWTH 3 DAYS Performed at [facility], [place] [address], [place], [place] [address]    Report Status PENDING  Incomplete    Procedures/Studies: IR Angiogram Visceral Selective  Result Date: 09/18/2021 INDICATION: 70 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 She presents for splenic angiogram and embolization of the aneurysm. EXAM: IR EMBO ARTERIAL NOT HEMORR HEMANG INC GUIDE ROADMAPPING; ADDITIONAL ARTERIOGRAPHY; IR ULTRASOUND GUIDANCE VASC ACCESS RIGHT; SELECTIVE VISCERAL ARTERIOGRAPHY MEDICATIONS: 500 mL Levaquin. Additionally, 10 mg Decadron and 30 mg Toradol were administered during the procedure. The antibiotic was administered within 1 hour of the procedure ANESTHESIA/SEDATION: General anesthesia. CONTRAST:  45mL OMNIPAQUE IOHEXOL 300 MG/ML SOLN, 53mL OMNIPAQUE IOHEXOL 300 MG/ML SOLN FLUOROSCOPY TIME:  Fluoroscopy Time: 37 minutes 6 seconds (4187 mGy). COMPLICATIONS: None immediate. PROCEDURE: Informed consent was obtained from the patient following explanation of the procedure, risks, benefits and alternatives. The patient understands, agrees and  consents for the procedure. All questions were addressed. A time out was performed prior to the initiation of the procedure. Maximal barrier sterile technique utilized including caps, mask, sterile gowns, sterile gloves, large sterile drape, hand hygiene, and Betadine prep. The right common femoral artery was interrogated with ultrasound and found to be widely patent. An image was obtained and stored for the medical record. Local anesthesia was attained by infiltration with 1% lidocaine. A small dermatotomy was made. Under real-time sonographic guidance, the vessel was punctured with a 21 gauge micropuncture needle. Using standard technique, the  initial micro needle was exchanged over a 0.018 micro wire for a transitional 4 [place] micro sheath. The micro sheath was then exchanged over a 0.035 wire for a 5 French vascular sheath. A C2 cobra catheter was used to select the celiac artery. A celiac arteriogram was performed. Conventional celiac axis anatomy. The approximately 3.2 cm aneurysm of the distal splenic artery was easily identified. A glidewire was advanced more distally into the splenic artery. The C2 cobra catheter was advanced into the mid aspect of the splenic artery. The glidewire was exchanged for a Rosen wire. The 5 [place] vascular sheath was exchanged for a 6 French 45 cm to rubor destination sheath. This was advanced into the proximal splenic artery. The Cobra catheter was then exchanged for a 5 French glide catheter which was then navigated even more distally into the splenic artery. One is much distal progression as possible could be made, a lantern microcatheter was introduced over a Fathom 16 wire in used to navigate into the aneurysm sac, and ultimately through the aneurysm sac into the outflow vessels. As seen on prior CTA imaging, the outflow vessel bifurcates after a short distance in provide supply to both the mid and inferior aspect of the spleen. A more proximal a branch arising from the  mid splenic artery provides flow to the upper pole of the spleen. Once the microcatheter was successfully navigated into the lower pole branch. Coil embolization of the outflow arteries was performed using a series of penumbra detachable microcoils. Once the outflow arteries had been embolized into the aneurysm sac, the aneurysm sac itself was stabilized by placement of two 36 mm and two 32 mm penumbra framing coils. Once the aneurysm was successfully framed, a 14 mm fibered interlock detachable coil was partially deployed on the back bench and soaked in thrombin solution. The catheter was then re- sheathed and successfully deployed into the center of the framed aneurysm. The inflow artery was then coil embolized using a series of penumbra coils terminating with a 45 cm liquid metal coil. Contrast injection performed at this time confirms no antegrade flow into the aneurysm sac and only retrograde flow in the splenic artery. Therefore, the microcatheter was removed. Injections were performed through the 5 French catheter in the more proximal splenic artery as well as through the 6 French sheath in the origin of the celiac artery. The proximal and mid splenic artery remain patent. The proximal branch to the upper pole is patent with excellent contrast blush of the upper pole of the spleen. Unfortunately, there is no significant reconstitution of more distal splenic branches resulting in absent parenchymal blush in the mid and distal spleen. 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 IMPRESSION: 1. Successful coil embolization of distal splenic artery aneurysm with no evidence of residual flow into the aneurysm sac. 2. This results in devascularization of approximately 2/3 of the splenic volume, predominantly in the mid and lower pole. 3. Persistent parenchymal opacification of the upper pole of the spleen due to a more proximal upper pole branch. PLAN: 1. Admit patient for observation, she may  require full admission for several days depending on the severity of her post embolization syndrome. 2. We will manage her aggressively with steroids, NSAIDs and normal caudate pain medications as needed. 3. Monitor signs and symptoms of post embolization syndrome. 4. Intravenous Levaquin while an inpatient. Upon discharge patient will complete a 2 week course of moxifloxacin 500 mg daily Electronically Signed   By: Jacqulynn Cadet  M.D.   On: 09/18/2021 19:41   IR Angiogram Selective Each Additional Vessel  Result Date: 09/18/2021 INDICATION: 70 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 She presents for splenic angiogram and embolization of the aneurysm. EXAM: IR EMBO ARTERIAL NOT HEMORR HEMANG INC GUIDE ROADMAPPING; ADDITIONAL ARTERIOGRAPHY; IR ULTRASOUND GUIDANCE VASC ACCESS RIGHT; SELECTIVE VISCERAL ARTERIOGRAPHY MEDICATIONS: 500 mL Levaquin. Additionally, 10 mg Decadron and 30 mg Toradol were administered during the procedure. The antibiotic was administered within 1 hour of the procedure ANESTHESIA/SEDATION: General anesthesia. CONTRAST:  8mL OMNIPAQUE IOHEXOL 300 MG/ML SOLN, 44mL OMNIPAQUE IOHEXOL 300 MG/ML SOLN FLUOROSCOPY TIME:  Fluoroscopy Time: 37 minutes 6 seconds (4187 mGy). COMPLICATIONS: None immediate. PROCEDURE: Informed consent was obtained from the patient following explanation of the procedure, risks, benefits and alternatives. The patient understands, agrees and consents for the procedure. All questions were addressed. A time out was performed prior to the initiation of the procedure. Maximal barrier sterile technique utilized including caps, mask, sterile gowns, sterile gloves, large sterile drape, hand hygiene, and Betadine prep. The right common femoral artery was interrogated with ultrasound and found to be widely patent. An image was obtained and stored for the medical record. Local anesthesia was attained by infiltration with 1%  lidocaine. A small dermatotomy was made. Under real-time sonographic guidance, the vessel was punctured with a 21 gauge micropuncture needle. Using standard technique, the initial micro needle was exchanged over a 0.018 micro wire for a transitional 4 [place] micro sheath. The micro sheath was then exchanged over a 0.035 wire for a 5 French vascular sheath. A C2 cobra catheter was used to select the celiac artery. A celiac arteriogram was performed. Conventional celiac axis anatomy. The approximately 3.2 cm aneurysm of the distal splenic artery was easily identified. A glidewire was advanced more distally into the splenic artery. The C2 cobra catheter was advanced into the mid aspect of the splenic artery. The glidewire was exchanged for a Rosen wire. The 5 [place] vascular sheath was exchanged for a 6 French 45 cm to rubor destination sheath. This was advanced into the proximal splenic artery. The Cobra catheter was then exchanged for a 5 French glide catheter which was then navigated even more distally into the splenic artery. One is much distal progression as possible could be made, a lantern microcatheter was introduced over a Fathom 16 wire in used to navigate into the aneurysm sac, and ultimately through the aneurysm sac into the outflow vessels. As seen on prior CTA imaging, the outflow vessel bifurcates after a short distance in provide supply to both the mid and inferior aspect of the spleen. A more proximal a branch arising from the mid splenic artery provides flow to the upper pole of the spleen. Once the microcatheter was successfully navigated into the lower pole branch. Coil embolization of the outflow arteries was performed using a series of penumbra detachable microcoils. Once the outflow arteries had been embolized into the aneurysm sac, the aneurysm sac itself was stabilized by placement of two 36 mm and two 32 mm penumbra framing coils. Once the aneurysm was successfully framed, a 14 mm fibered  interlock detachable coil was partially deployed on the back bench and soaked in thrombin solution. The catheter was then re- sheathed and successfully deployed into the center of the framed aneurysm. The inflow artery was then coil embolized using a series of penumbra coils terminating with a 45 cm liquid metal coil. Contrast injection performed at this time confirms no antegrade flow  into the aneurysm sac and only retrograde flow in the splenic artery. Therefore, the microcatheter was removed. Injections were performed through the 5 French catheter in the more proximal splenic artery as well as through the 6 French sheath in the origin of the celiac artery. The proximal and mid splenic artery remain patent. The proximal branch to the upper pole is patent with excellent contrast blush of the upper pole of the spleen. Unfortunately, there is no significant reconstitution of more distal splenic branches resulting in absent parenchymal blush in the mid and distal spleen. 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 IMPRESSION: 1. Successful coil embolization of distal splenic artery aneurysm with no evidence of residual flow into the aneurysm sac. 2. This results in devascularization of approximately 2/3 of the splenic volume, predominantly in the mid and lower pole. 3. Persistent parenchymal opacification of the upper pole of the spleen due to a more proximal upper pole branch. PLAN: 1. 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 2. We will manage her aggressively with steroids, NSAIDs and normal caudate pain medications as needed. 3. Monitor signs and symptoms of post embolization syndrome. 4. Intravenous Levaquin while an inpatient. Upon discharge patient will complete a 2 week course of moxifloxacin 500 mg daily Electronically Signed   By: Jacqulynn Cadet M.D.   On: 09/18/2021 19:41   US ARTERIAL  ABI (SCREENING LOWER EXTREMITY)  Result Date: 09/16/2021 CLINICAL DATA:  Family history of popliteal artery aneurysms EXAM: NONINVASIVE PHYSIOLOGIC VASCULAR STUDY OF BILATERAL LOWER EXTREMITIES TECHNIQUE: Evaluation of both lower extremities were performed at rest, including calculation of ankle-brachial indices with single level Doppler, pressure and pulse volume recording. COMPARISON:  None. FINDINGS: Right ABI:  1.2 Left ABI:  1.2 Right Lower Extremity:  Normal arterial waveforms at the ankle. Left Lower Extremity:  Normal arterial waveforms at the ankle. 1.0-1.4 Normal IMPRESSION: Normal bilateral resting ankle-brachial indices. Electronically Signed   By: Jacqulynn Cadet M.D.   On: 09/16/2021 11:50   US ARTERIAL LOWER EXTREMITY DUPLEX BILATERAL  Result Date: 09/16/2021 CLINICAL DATA:  Splenic artery aneurysm with significant family history of intracranial and popliteal aneurysms. Assess for popliteal aneurysmal disease. EXAM: BILATERAL LOWER EXTREMITY ARTERIAL DUPLEX SCAN TECHNIQUE: Gray-scale sonography as well as color Doppler and duplex ultrasound was performed to evaluate the arteries of both lower extremities including the common, superficial and profunda femoral arteries, popliteal artery and calf arteries. COMPARISON:  None. FINDINGS: Right Lower Extremity ABI: 1.2 Inflow: Normal common femoral arterial waveforms and velocities. No evidence of inflow (aortoiliac) disease. Outflow: Normal profunda femoral, superficial femoral and popliteal arterial waveforms and velocities. No focal elevation of the PSV to suggest stenosis. Runoff: Normal posterior and anterior tibial arterial waveforms and velocities. Vessels are patent to the ankle. Left Lower Extremity ABI: 1.2 Inflow: Normal common femoral arterial waveforms and velocities. No evidence of inflow (aortoiliac) disease. Outflow: Normal profunda femoral, superficial femoral and popliteal arterial waveforms and velocities. No focal elevation of  the PSV to suggest stenosis. Runoff: Normal posterior and anterior tibial arterial waveforms and velocities. Vessels are patent to the ankle. IMPRESSION: 1. Negative for evidence of popliteal artery aneurysm. 2. Trace atherosclerotic plaque without stenosis or occlusion. 3. Normal bilateral resting ankle-brachial indices. Signed, Criselda Peaches, MD, [facility] Radiology Electronically Signed   By: Jacqulynn Cadet M.D.   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 She presents for splenic angiogram and embolization of the aneurysm. EXAM: IR EMBO ARTERIAL NOT HEMORR HEMANG INC GUIDE ROADMAPPING; ADDITIONAL ARTERIOGRAPHY; IR ULTRASOUND GUIDANCE VASC ACCESS RIGHT; SELECTIVE VISCERAL ARTERIOGRAPHY MEDICATIONS: 500 mL Levaquin. Additionally, 10 mg Decadron and 30 mg Toradol were administered during the procedure. The antibiotic was administered within 1 hour of the procedure ANESTHESIA/SEDATION: General anesthesia. CONTRAST:  66mL OMNIPAQUE IOHEXOL 300 MG/ML SOLN, 5mL OMNIPAQUE IOHEXOL 300 MG/ML SOLN FLUOROSCOPY TIME:  Fluoroscopy Time: 37 minutes 6 seconds (4187 mGy). COMPLICATIONS: None immediate. PROCEDURE: Informed consent was obtained from the patient following explanation of the procedure, risks, benefits and alternatives. The patient understands, agrees and consents for the procedure. All questions were addressed. A time out was performed prior to the initiation of the procedure. Maximal barrier sterile technique utilized including caps, mask, sterile gowns, sterile gloves, large sterile drape, hand hygiene, and Betadine prep. The right common femoral artery was interrogated with ultrasound and found to be widely patent. An image was obtained and stored for the medical record. Local anesthesia was attained by  infiltration with 1% lidocaine. A small dermatotomy was made. Under real-time sonographic guidance, the vessel was punctured with a 21 gauge micropuncture needle. Using standard technique, the initial micro needle was exchanged over a 0.018 micro wire for a transitional 4 [place] micro sheath. The micro sheath was then exchanged over a 0.035 wire for a 5 French vascular sheath. A C2 cobra catheter was used to select the celiac artery. A celiac arteriogram was performed. Conventional celiac axis anatomy. The approximately 3.2 cm aneurysm of the distal splenic artery was easily identified. A glidewire was advanced more distally into the splenic artery. The C2 cobra catheter was advanced into the mid aspect of the splenic artery. The glidewire was exchanged for a Rosen wire. The 5 [place] vascular sheath was exchanged for a 6 French 45 cm to rubor destination sheath. This was advanced into the proximal splenic artery. The Cobra catheter was then exchanged for a 5 French glide catheter which was then navigated even more distally into the splenic artery. One is much distal progression as possible could be made, a lantern microcatheter was introduced over a Fathom 16 wire in used to navigate into the aneurysm sac, and ultimately through the aneurysm sac into the outflow vessels. As seen on prior CTA imaging, the outflow vessel bifurcates after a short distance in provide supply to both the mid and inferior aspect of the spleen. A more proximal a branch arising from the mid splenic artery provides flow to the upper pole of the spleen. Once the microcatheter was successfully navigated into the lower pole branch. Coil embolization of the outflow arteries was performed using a series of penumbra detachable microcoils. Once the outflow arteries had been embolized into the aneurysm sac, the aneurysm sac itself was stabilized by placement of two 36 mm and two 32 mm penumbra framing coils. Once the aneurysm was successfully framed,  a 14 mm fibered interlock detachable coil was partially deployed on the back bench and soaked in thrombin solution. The catheter was then re- sheathed and successfully deployed into the center of the framed aneurysm. The inflow artery was then coil embolized using a series of penumbra coils terminating with a 45 cm liquid metal coil. Contrast injection performed at this time confirms no antegrade flow into the aneurysm sac and only retrograde flow in the splenic artery.  Therefore, the microcatheter was removed. Injections were performed through the 5 French catheter in the more proximal splenic artery as well as through the 6 French sheath in the origin of the celiac artery. The proximal and mid splenic artery remain patent. The proximal branch to the upper pole is patent with excellent contrast blush of the upper pole of the spleen. Unfortunately, there is no significant reconstitution of more distal splenic branches resulting in absent parenchymal blush in the mid and distal spleen. 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 IMPRESSION: 1. Successful coil embolization of distal splenic artery aneurysm with no evidence of residual flow into the aneurysm sac. 2. This results in devascularization of approximately 2/3 of the splenic volume, predominantly in the mid and lower pole. 3. Persistent parenchymal opacification of the upper pole of the spleen due to a more proximal upper pole branch. PLAN: 1. 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 2. We will manage her aggressively with steroids, NSAIDs and normal caudate pain medications as needed. 3. Monitor signs and symptoms of post embolization syndrome. 4. Intravenous Levaquin while an inpatient. Upon discharge patient will complete a 2 week course of moxifloxacin 500 mg daily Electronically Signed   By: Jacqulynn Cadet M.D.   On: 09/18/2021 19:41    ECHOCARDIOGRAM COMPLETE  Result Date: 09/02/2021    ECHOCARDIOGRAM REPORT   Patient Name:   Courtney Buckley Date of Exam: 09/02/2021 Medical Rec #:  [id number]           Height:       66.0 in Accession #:    [id number]          Weight:       181.0 lb Date of Birth:  Aug 11, 1951           BSA:          1.917 m Patient Age:    67 years            BP:           102/57 mmHg Patient Gender: F                   HR:           82 bpm. Exam Location:  [facility] Procedure: 2D Echo, 3D Echo, Cardiac Doppler, Color Doppler and Strain Analysis Indications:    I50.31 CHF  History:        Patient has prior history of Echocardiogram examinations, most                 recent 01/23/2021. CHF, Signs/Symptoms:Edema; Risk Factors:Former                 Smoker. Bone Marrow Cancer.  Sonographer:    Deliah Boston RDCS Referring Phys: Randa Evens SWFUXNAT IMPRESSIONS  1. Left ventricular ejection fraction, by estimation, is 65 to 70%. The left ventricle has normal function. The left ventricle has no regional wall motion abnormalities. Left ventricular diastolic parameters are consistent with Grade II diastolic dysfunction (pseudonormalization). The average left ventricular global longitudinal strain is 25.7 %. The global longitudinal strain is normal.  2. Right ventricular systolic function is normal. The right ventricular size is normal. There is moderately elevated pulmonary artery systolic pressure. The estimated right ventricular systolic pressure is 55.7 mmHg.  3. The mitral valve is normal in structure. Trivial mitral valve regurgitation. No evidence of mitral stenosis, the  mean gradient is elevated but the valve opens well -- possible elevation in gradient due to high flow. The mean mitral valve gradient is 6.0 mmHg. Moderate mitral annular calcification.  4. The aortic valve is tricuspid. Aortic valve regurgitation is not visualized. No aortic stenosis is present, the mean gradient is elevated but the valve opens well --  possible elevation in gradient due to high flow (same as mitral valve). Aortic valve  area, by VTI measures 3.23 cm. Aortic valve mean gradient measures 17.0 mmHg.  5. Aortic dilatation noted. There is mild dilatation of the ascending aorta, measuring 42 mm.  6. Left atrial size was severely dilated.  7. The inferior vena cava is dilated in size with <50% respiratory variability, suggesting right atrial pressure of 15 mmHg.  8. No definite ASD was visualized. FINDINGS  Left Ventricle: Left ventricular ejection fraction, by estimation, is 65 to 70%. The left ventricle has normal function. The left ventricle has no regional wall motion abnormalities. The average left ventricular global longitudinal strain is 25.7 %. The  global longitudinal strain is normal. The left ventricular internal cavity size was normal in size. There is no left ventricular hypertrophy. Left ventricular diastolic parameters are consistent with Grade II diastolic dysfunction (pseudonormalization). Right Ventricle: The right ventricular size is normal. No increase in right ventricular wall thickness. Right ventricular systolic function is normal. There is moderately elevated pulmonary artery systolic pressure. The tricuspid regurgitant velocity is 2.92 m/s, and with an assumed right atrial pressure of 15 mmHg, the estimated right ventricular systolic pressure is 38.1 mmHg. Left Atrium: Left atrial size was severely dilated. Right Atrium: Right atrial size was normal in size. Pericardium: There is no evidence of pericardial effusion. Mitral Valve: The mitral valve is normal in structure. Moderate mitral annular calcification. Trivial mitral valve regurgitation. No evidence of mitral valve stenosis. MV peak gradient, 14.7 mmHg. The mean mitral valve gradient is 6.0 mmHg. Tricuspid Valve: The tricuspid valve is normal in structure. Tricuspid valve regurgitation is trivial. Aortic Valve: The aortic valve is tricuspid. Aortic valve regurgitation is  not visualized. No aortic stenosis is present. Aortic valve mean gradient measures 17.0 mmHg. Aortic valve peak gradient measures 27.8 mmHg. Aortic valve area, by VTI measures 3.23 cm. Pulmonic Valve: The pulmonic valve was normal in structure. Pulmonic valve regurgitation is trivial. Aorta: Aortic dilatation noted. There is mild dilatation of the ascending aorta, measuring 42 mm. Venous: The inferior vena cava is dilated in size with less than 50% respiratory variability, suggesting right atrial pressure of 15 mmHg. IAS/Shunts: No definite ASD was visualized.  LEFT VENTRICLE PLAX 2D LVIDd:         5.20 cm   Diastology LVIDs:         3.10 cm   LV e' medial:    11.40 cm/s LV PW:         0.90 cm   LV E/e' medial:  13.3 LV IVS:        1.10 cm   LV e' lateral:   15.70 cm/s LVOT diam:     2.40 cm   LV E/e' lateral: 9.7 LV SV:         177 LV SV Index:   92        2D Longitudinal Strain LVOT Area:     4.52 cm  2D Strain GLS (A2C):   26.2 %  2D Strain GLS (A3C):   25.9 %                          2D Strain GLS (A4C):   24.9 %                          2D Strain GLS Avg:     25.7 %                           3D Volume EF:                          3D EF:        66 %                          LV EDV:       203 ml                          LV ESV:       69 ml                          LV SV:        134 ml RIGHT VENTRICLE RV S prime:     19.20 cm/s RVOT diam:      2.90 cm TAPSE (M-mode): 3.7 cm LEFT ATRIUM              Index        RIGHT ATRIUM           Index LA diam:        5.00 cm  2.61 cm/m   RA Area:     15.80 cm LA Vol (A2C):   143.0 ml 74.61 ml/m  RA Volume:   42.10 ml  21.96 ml/m LA Vol (A4C):   153.0 ml 79.82 ml/m LA Biplane Vol: 154.0 ml 80.34 ml/m  AORTIC VALVE AV Area (Vmax):    3.33 cm AV Area (Vmean):   3.25 cm AV Area (VTI):     3.23 cm AV Vmax:           263.80 cm/s AV Vmean:          181.800 cm/s AV VTI:            0.547 m AV Peak Grad:      27.8 mmHg AV Mean Grad:      17.0 mmHg  LVOT Vmax:         194.00 cm/s LVOT Vmean:        130.500 cm/s LVOT VTI:          0.390 m LVOT/AV VTI ratio: 0.71  AORTA Ao Root diam: 3.50 cm Ao Asc diam:  4.15 cm MITRAL VALVE                TRICUSPID VALVE MV Area (PHT): cm          TR Peak grad:   34.1 mmHg MV Area VTI:   2.48 cm     TR Vmax:        292.00 cm/s MV Peak grad:  14.7 mmHg MV Mean grad:  6.0 mmHg     SHUNTS MV Vmax:       1.92 m/s  Systemic VTI:  0.39 m MV Vmean:      107.0 cm/s   Systemic Diam: 2.40 cm MV Decel Time: 247 msec     Pulmonic Diam: 2.90 cm MV E velocity: 151.75 cm/s MV A velocity: 134.50 cm/s MV E/A ratio:  1.13 Dalton McleanMD Electronically signed by Franki Monte Signature Date/Time: 09/02/2021/6:22:17 PM    Final    IR EMBO ARTERIAL NOT HEMORR HEMANG INC GUIDE ROADMAPPING  Result Date: 09/18/2021 INDICATION: 70 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 She presents for splenic angiogram and embolization of the aneurysm. EXAM: IR EMBO ARTERIAL NOT HEMORR HEMANG INC GUIDE ROADMAPPING; ADDITIONAL ARTERIOGRAPHY; IR ULTRASOUND GUIDANCE VASC ACCESS RIGHT; SELECTIVE VISCERAL ARTERIOGRAPHY MEDICATIONS: 500 mL Levaquin. Additionally, 10 mg Decadron and 30 mg Toradol were administered during the procedure. The antibiotic was administered within 1 hour of the procedure ANESTHESIA/SEDATION: General anesthesia. CONTRAST:  79mL OMNIPAQUE IOHEXOL 300 MG/ML SOLN, 42mL OMNIPAQUE IOHEXOL 300 MG/ML SOLN FLUOROSCOPY TIME:  Fluoroscopy Time: 37 minutes 6 seconds (4187 mGy). COMPLICATIONS: None immediate. PROCEDURE: Informed consent was obtained from the patient following explanation of the procedure, risks, benefits and alternatives. The patient understands, agrees and consents for the procedure. All questions were addressed. A time out was performed prior to the initiation of the procedure. Maximal barrier sterile technique utilized including caps, mask, sterile gowns, sterile gloves, large  sterile drape, hand hygiene, and Betadine prep. The right common femoral artery was interrogated with ultrasound and found to be widely patent. An image was obtained and stored for the medical record. Local anesthesia was attained by infiltration with 1% lidocaine. A small dermatotomy was made. Under real-time sonographic guidance, the vessel was punctured with a 21 gauge micropuncture needle. Using standard technique, the initial micro needle was exchanged over a 0.018 micro wire for a transitional 4 [place] micro sheath. The micro sheath was then exchanged over a 0.035 wire for a 5 French vascular sheath. A C2 cobra catheter was used to select the celiac artery. A celiac arteriogram was performed. Conventional celiac axis anatomy. The approximately 3.2 cm aneurysm of the distal splenic artery was easily identified. A glidewire was advanced more distally into the splenic artery. The C2 cobra catheter was advanced into the mid aspect of the splenic artery. The glidewire was exchanged for a Rosen wire. The 5 [place] vascular sheath was exchanged for a 6 French 45 cm to rubor destination sheath. This was advanced into the proximal splenic artery. The Cobra catheter was then exchanged for a 5 French glide catheter which was then navigated even more distally into the splenic artery. One is much distal progression as possible could be made, a lantern microcatheter was introduced over a Fathom 16 wire in used to navigate into the aneurysm sac, and ultimately through the aneurysm sac into the outflow vessels. As seen on prior CTA imaging, the outflow vessel bifurcates after a short distance in provide supply to both the mid and inferior aspect of the spleen. A more proximal a branch arising from the mid splenic artery provides flow to the upper pole of the spleen. Once the microcatheter was successfully navigated into the lower pole branch. Coil embolization of the outflow arteries was performed using a series of penumbra  detachable microcoils. Once the outflow arteries had been embolized into the aneurysm sac, the aneurysm sac itself was stabilized by placement of two 36 mm and two 32 mm penumbra framing coils. Once the aneurysm was successfully framed, a 14 mm fibered interlock  detachable coil was partially deployed on the back bench and soaked in thrombin solution. The catheter was then re- sheathed and successfully deployed into the center of the framed aneurysm. The inflow artery was then coil embolized using a series of penumbra coils terminating with a 45 cm liquid metal coil. Contrast injection performed at this time confirms no antegrade flow into the aneurysm sac and only retrograde flow in the splenic artery. Therefore, the microcatheter was removed. Injections were performed through the 5 French catheter in the more proximal splenic artery as well as through the 6 French sheath in the origin of the celiac artery. The proximal and mid splenic artery remain patent. The proximal branch to the upper pole is patent with excellent contrast blush of the upper pole of the spleen. Unfortunately, there is no significant reconstitution of more distal splenic branches resulting in absent parenchymal blush in the mid and distal spleen. 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 IMPRESSION: 1. Successful coil embolization of distal splenic artery aneurysm with no evidence of residual flow into the aneurysm sac. 2. This results in devascularization of approximately 2/3 of the splenic volume, predominantly in the mid and lower pole. 3. Persistent parenchymal opacification of the upper pole of the spleen due to a more proximal upper pole branch. PLAN: 1. 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 2. We will manage her aggressively with steroids, NSAIDs and normal caudate pain medications as needed. 3. Monitor signs and  symptoms of post embolization syndrome. 4. Intravenous Levaquin while an inpatient. Upon discharge patient will complete a 2 week course of moxifloxacin 500 mg daily Electronically Signed   By: Jacqulynn Cadet M.D.   On: 09/18/2021 19:41   MR ANGIO HEAD WO W CONTRAST  Result Date: 09/15/2021 CLINICAL DATA:  Visceral aneurysm, rule out cerebral aneurysm. EXAM: MRA HEAD WITHOUT AND WITH CONTRAST TECHNIQUE: Angiographic images of the Circle of Willis were obtained using MRA technique with and without intravenous contrast. On a professional basis, the patient should only be billed for a noncontrast intracranial MRA. CONTRAST:  7.79mL GADAVIST GADOBUTROL 1 MMOL/ML IV SOLN COMPARISON:  None. FINDINGS: ICA tortuosity at the skull base.  No beading or dissection. Negative for intracranial aneurysm, beading, or vascular malformation. No branch occlusion or stenosis. Postcontrast images of the arterial system were acquired and are noncontributory. Neck MRA images were inadvertently acquired and are negative. IMPRESSION: 1. Negative for cerebral aneurysm. 2. Inadvertent neck MRA which is also negative Electronically Signed   By: Jorje Guild M.D.   On: 09/15/2021 11:26   IR Radiologist Eval & Mgmt  Result Date: 09/09/2021 Please refer to notes tab for details about interventional procedure. 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 Multiplanar reconstructed images and MIPs were obtained and reviewed to evaluate the vascular anatomy. CONTRAST:  181mL OMNIPAQUE IOHEXOL 350 MG/ML SOLN COMPARISON:  CT  abdomen pelvis 08/26/2021, IR embolization 09/18/2021 FINDINGS: VASCULAR No active extravasation of intravenous contrast. Aorta: Mild atherosclerotic plaque. Normal caliber aorta without aneurysm, dissection, vasculitis or significant stenosis. Celiac: Patent  without evidence of aneurysm, dissection, vasculitis or significant stenosis. The splenic artery/splenic hilar vessel are not visualized due to marked streak artifact from the splenic vasculature coiling. SMA: Patent without evidence of aneurysm, dissection, vasculitis or significant stenosis. Renals: Both renal arteries are patent without evidence of aneurysm, dissection, vasculitis, fibromuscular dysplasia or significant stenosis. IMA: Patent without evidence of aneurysm, dissection, vasculitis or significant stenosis. Inflow: Patent without evidence of aneurysm, dissection, vasculitis or significant stenosis. Proximal Outflow: Bilateral common femoral and visualized portions of the superficial and profunda femoral arteries are patent without evidence of aneurysm, dissection, vasculitis or significant stenosis. Veins: Marked left upper quadrant venous collaterals. The hepatic veins are patent. Chronic portal vein occlusion. Review of the MIP images confirms the above findings. NON-VASCULAR Lower chest: No artery calcifications.  No acute abnormality. Hepatobiliary: Redemonstration of right hepatic lobe hemangioma. No new focal liver abnormality. Gallbladder sludge versus gallstone within the gallbladder lumen. No gallbladder wall thickening or pericholecystic fluid. No biliary dilatation. Pancreas: No focal lesion. Normal pancreatic contour. No surrounding inflammatory changes. No main pancreatic ductal dilatation. 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 No definite active hemorrhage of the splenic parenchyma noted.  Adrenals/Urinary Tract: No adrenal nodule bilaterally. Bilateral kidneys enhance symmetrically. No hydronephrosis. No hydroureter. The urinary bladder is unremarkable. Stomach/Bowel: Stomach is within normal limits. No evidence of bowel wall thickening or dilatation. Appendix appears normal. Lymphatic: No lymphadenopathy Reproductive: Intramural pericentimeter uterine fibroid. Uterus and bilateral adnexa are unremarkable. Other: Trace simple free fluid within the pelvis. No intraperitoneal free gas. No organized fluid collection. Musculoskeletal: Subcutaneus soft tissue edema more prominent along the upper flanks. No large hematoma formation. No suspicious lytic or blastic osseous lesions. No acute displaced fracture. IMPRESSION: VASCULAR 1. Markedly limited evaluation of the splenic hilum vasculature due to marked metallic artifact originating from the vascular coiling. No pneumoperitoneum suggest active intraperitoneal bleed. 2. No acute vascular abnormality. 3. 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 NON-VASCULAR 1. Interval development of descending colitis. No CT evidence of definite gastrointestinal bleed. 2. 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 No findings to suggest definite active splenic hemorrhage. 3. Gallbladder sludge versus gallstone within the gallbladder lumen. 4. Intramural uterine fibroid. 5. Trace nonspecific simple free fluid within the pelvis. These results were called by telephone at the time of interpretation on 09/28/2021 at 11:15 pm to provider Dr. Almyra Free, who verbally acknowledged these results. Electronically Signed   By: Iven Finn M.D.   On: 09/28/2021 23:23    Labs: BNP (last 3 results) Recent Labs    07/02/21 1538  BNP 222.9*   Basic Metabolic Panel: Recent Labs  Lab 09/28/21 1933 09/30/21 0056 10/01/21 0351  NA 140 137 132*  K 4.3 3.3* 3.5  CL 111 108  105  CO2 21* 23 24  GLUCOSE 112* 91 101*  BUN 18 15 14   CREATININE 0.67 0.62 0.59  CALCIUM 7.9* 7.6* 7.5*   Liver Function Tests: Recent Labs  Lab 09/28/21 1933 09/30/21 0056 10/01/21 0351  AST 28 23 20   ALT 16 14 13   ALKPHOS 83 91 84  BILITOT 2.0* 3.3* 2.4*  PROT 4.0* 3.8* 3.8*  ALBUMIN 2.4* 2.2* 2.1*   Recent Labs  Lab 09/28/21 1933  LIPASE 22   No results for input(s): AMMONIA in the last  168 hours. CBC: Recent Labs  Lab 09/28/21 1933 09/29/21 0939 09/29/21 1430 09/30/21 0056 10/01/21 0351 10/02/21 0059  WBC 30.5* 31.1*  --  35.3* 34.1* 32.8*  NEUTROABS 24.7* 28.3*  --  30.3* 32.4*  --   HGB 4.0* 7.8* 7.1* 7.0* 8.3* 8.3*  HCT 13.6* 23.8* 22.9* 22.4* 25.6* 26.1*  MCV 97.1 90.5  --  91.4 89.5 90.3  PLT 1,026* 838*  --  750* 512* 469*   Cardiac Enzymes: No results for input(s): CKTOTAL, CKMB, CKMBINDEX, TROPONINI in the last 168 hours. BNP: Invalid input(s): POCBNP CBG: No results for input(s): GLUCAP in the last 168 hours. D-Dimer No results for input(s): DDIMER in the last 72 hours. Hgb A1c No results for input(s): HGBA1C in the last 72 hours. Lipid Profile No results for input(s): CHOL, HDL, LDLCALC, TRIG, CHOLHDL, LDLDIRECT in the last 72 hours. Thyroid function studies No results for input(s): TSH, T4TOTAL, T3FREE, THYROIDAB in the last 72 hours.  Invalid input(s): FREET3 Anemia work up Recent Labs    09/30/21 0056  FERRITIN 105  TIBC 185*  IRON 11*   Urinalysis    Component Value Date/Time   COLORURINE YELLOW 07/02/2021 1642   APPEARANCEUR CLEAR 07/02/2021 1642   LABSPEC 1.011 07/02/2021 1642   PHURINE 7.0 07/02/2021 1642   GLUCOSEU NEGATIVE 07/02/2021 1642   HGBUR NEGATIVE 07/02/2021 1642   BILIRUBINUR neg 08/26/2021 0950   KETONESUR NEGATIVE 07/02/2021 1642   PROTEINUR Negative 08/26/2021 0950   PROTEINUR NEGATIVE 07/02/2021 1642   UROBILINOGEN 1.0 (A) 08/26/2021 0950   NITRITE neg 08/26/2021 0950   NITRITE NEGATIVE 07/02/2021  1642   LEUKOCYTESUR Negative 08/26/2021 0950   LEUKOCYTESUR NEGATIVE 07/02/2021 1642   Sepsis Labs Invalid input(s): PROCALCITONIN,  WBC,  LACTICIDVEN Microbiology Recent Results (from the past 240 hour(s))  Resp Panel by RT-PCR (Flu A&B, Covid) Nasopharyngeal Swab     Status: None   Collection Time: 09/28/21  9:41 PM   Specimen: Nasopharyngeal Swab; Nasopharyngeal(NP) swabs in vial transport medium  Result Value Ref Range Status   SARS Coronavirus 2 by RT PCR NEGATIVE NEGATIVE Final    Comment: (NOTE) SARS-CoV-2 target nucleic acids are NOT DETECTED.  The SARS-CoV-2 RNA is generally detectable in upper respiratory specimens during the acute phase of infection. The lowest concentration of SARS-CoV-2 viral copies this assay can detect is 138 copies/mL. A negative result does not preclude SARS-Cov-2 infection and should not be used as the sole basis for treatment or other patient management decisions. A negative result may occur with  improper specimen collection/handling, submission of specimen other than nasopharyngeal swab, presence of viral mutation(s) within the areas targeted by this assay, and inadequate number of viral copies(<138 copies/mL). A negative result must be combined with clinical observations, patient history, and epidemiological information. The expected result is Negative.  Fact Sheet for Patients:  EntrepreneurPulse.com.au  Fact Sheet for Healthcare Providers:  IncredibleEmployment.be  This test is no t yet approved or cleared by the [place] FDA and  has been authorized for detection and/or diagnosis of SARS-CoV-2 by FDA under an Emergency Use Authorization (EUA). This EUA will remain  in effect (meaning this test can be used) for the duration of the COVID-19 declaration under Section 564(b)(1) of the Act, 21 U.S.C.section 360bbb-3(b)(1), unless the authorization is terminated  or revoked sooner.       Influenza  A by PCR NEGATIVE NEGATIVE Final   Influenza B by PCR NEGATIVE NEGATIVE Final    Comment: (NOTE) The Xpert Xpress SARS-CoV-2/FLU/RSV  plus assay is intended as an aid in the diagnosis of influenza from Nasopharyngeal swab specimens and should not be used as a sole basis for treatment. Nasal washings and aspirates are unacceptable for Xpert Xpress SARS-CoV-2/FLU/RSV testing.  Fact Sheet for Patients: EntrepreneurPulse.com.au  Fact Sheet for Healthcare Providers: IncredibleEmployment.be  This test is not yet approved or cleared by the [place] FDA and has been authorized for detection and/or diagnosis of SARS-CoV-2 by FDA under an Emergency Use Authorization (EUA). This EUA will remain in effect (meaning this test can be used) for the duration of the COVID-19 declaration under Section 564(b)(1) of the Act, 21 U.S.C. section 360bbb-3(b)(1), unless the authorization is terminated or revoked.  Performed at [facility], [place] [address] [place], [place] [address]   Culture, blood (routine x 2)     Status: None (Preliminary result)   Collection Time: 09/29/21  4:51 AM   Specimen: BLOOD  Result Value Ref Range Status   Specimen Description BLOOD LEFT ANTECUBITAL  Final   Special Requests   Final    BOTTLES DRAWN AEROBIC AND ANAEROBIC Blood Culture adequate volume   Culture   Final    NO GROWTH 3 DAYS Performed at [facility], [place] [address] [place], [place] [address]    Report Status PENDING  Incomplete  Culture, blood (routine x 2)     Status: None (Preliminary result)   Collection Time: 09/29/21  9:39 AM   Specimen: BLOOD  Result Value Ref Range Status   Specimen Description BLOOD RIGHT ANTECUBITAL  Final   Special Requests   Final    BOTTLES DRAWN AEROBIC AND ANAEROBIC Blood Culture adequate volume   Culture   Final    NO GROWTH 3 DAYS Performed at [facility], [place] [address] [place], [place] [address]    Report  Status PENDING  Incomplete     Time coordinating discharge: 35 minutes  SIGNED: Antonieta Pert, MD  [facility] 10/02/2021, 11:29 AM  If 7PM-7AM, please contact night-coverage [email or web address]

## 2021-10-02 NOTE — TOC Transition Note (Signed)
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      Per MD patient ready for DC today . RN, patient, and patient's husband notified of DC.  Referral made with [facility] for W/C.Equipment will be delivered to bedside prior to d/c. Post hospital f/u noted on AVS. Pt without Rx med concerns. Husband to provide transportation to home.  RNCM will sign off for now as intervention is no longer needed. Please consult [place] again if new needs arise.    Final next level of care: Home/Self Care     Patient Goals and CMS Choice     Choice offered to / list presented to : Patient  Discharge Placement                       Discharge Plan and Services                DME Arranged: Wheelchair manual DME Agency: [facility] Date DME Agency Contacted: 10/02/21 Time DME Agency Contacted: 0712 Representative spoke with at DME Agency: [place] ([place]) Interventions     Readmission Risk Interventions No flowsheet data found.

## 2021-10-02 NOTE — Plan of Care (Signed)
Problem: Education: Goal: Knowledge of General Education information will improve Description: Including pain rating scale, medication(s)/side effects and non-pharmacologic comfort measures 10/02/2021 1406 by Cailean Heacock L, LPN Outcome: Adequate for Discharge 10/02/2021 1406 by Robby Bulkley L, LPN Outcome: Adequate for Discharge   Problem: Health Behavior/Discharge Planning: Goal: Ability to manage health-related needs will improve 10/02/2021 1406 by Jaren Vanetten L, LPN Outcome: Adequate for Discharge 10/02/2021 1406 by Willam Munford,  L, LPN Outcome: Adequate for Discharge   Problem: Clinical Measurements: Goal: Ability to maintain clinical measurements within normal limits will improve 10/02/2021 1406 by Chais Fehringer L, LPN Outcome: Adequate for Discharge 10/02/2021 1406 by Shadee Rathod L, LPN Outcome: Adequate for Discharge Goal: Will remain free from infection 10/02/2021 1406 by Raylyn Speckman L, LPN Outcome: Adequate for Discharge 10/02/2021 1406 by Zarion Oliff, Doreene Adas L, LPN Outcome: Adequate for Discharge Goal: Diagnostic test results will improve 10/02/2021 1406 by Anyah Swallow L, LPN Outcome: Adequate for Discharge 10/02/2021 1406 by Deunte Bledsoe, Doreene Adas L, LPN Outcome: Adequate for Discharge Goal: Respiratory complications will improve 10/02/2021 1406 by Vadhir Mcnay L, LPN Outcome: Adequate for Discharge 10/02/2021 1406 by Leslye Puccini, Doreene Adas L, LPN Outcome: Adequate for Discharge Goal: Cardiovascular complication will be avoided 10/02/2021 1406 by Jaylah Goodlow L, LPN Outcome: Adequate for Discharge 10/02/2021 1406 by Lakeysha Slutsky L, LPN Outcome: Adequate for Discharge   Problem: Activity: Goal: Risk for activity intolerance will decrease 10/02/2021 1406 by Zoe Creasman L, LPN Outcome: Adequate for Discharge 10/02/2021 1406 by Vianne Grieshop L, LPN Outcome: Adequate for Discharge   Problem: Nutrition: Goal: Adequate nutrition will be  maintained 10/02/2021 1406 by Micole Delehanty L, LPN Outcome: Adequate for Discharge 10/02/2021 1406 by Espyn Radwan L, LPN Outcome: Adequate for Discharge   Problem: Coping: Goal: Level of anxiety will decrease 10/02/2021 1406 by Zhaniya Swallows L, LPN Outcome: Adequate for Discharge 10/02/2021 1406 by Cathren Sween L, LPN Outcome: Adequate for Discharge   Problem: Elimination: Goal: Will not experience complications related to bowel motility 10/02/2021 1406 by Oris Drone L, LPN Outcome: Adequate for Discharge 10/02/2021 1406 by , Doreene Adas L, LPN Outcome: Adequate for Discharge Goal: Will not experience complications related to urinary retention 10/02/2021 1406 by Iyanni Hepp L, LPN Outcome: Adequate for Discharge 10/02/2021 1406 by Alaiah Lundy L, LPN Outcome: Adequate for Discharge   Problem: Pain Managment: Goal: General experience of comfort will improve 10/02/2021 1406 by Ethelean Colla L, LPN Outcome: Adequate for Discharge 10/02/2021 1406 by Stefhanie Kachmar L, LPN Outcome: Adequate for Discharge   Problem: Safety: Goal: Ability to remain free from injury will improve 10/02/2021 1406 by Jashawna Reever L, LPN Outcome: Adequate for Discharge 10/02/2021 1406 by Lucciana Head L, LPN Outcome: Adequate for Discharge   Problem: Skin Integrity: Goal: Risk for impaired skin integrity will decrease 10/02/2021 1406 by Nyliah Nierenberg L, LPN Outcome: Adequate for Discharge 10/02/2021 1406 by Lety Cullens L, LPN Outcome: Adequate for Discharge   Problem: Education: Goal: Ability to identify signs and symptoms of gastrointestinal bleeding will improve 10/02/2021 1406 by Aralynn Brake L, LPN Outcome: Adequate for Discharge 10/02/2021 1406 by Ronin Crager L, LPN Outcome: Adequate for Discharge   Problem: Bowel/Gastric: Goal: Will show no signs and symptoms of gastrointestinal bleeding 10/02/2021 1406 by [place] L, LPN Outcome:  Adequate for Discharge 10/02/2021 1406 by Jameica Couts L, LPN Outcome: Adequate for Discharge   Problem: Fluid Volume: Goal: Will show no signs and symptoms of excessive bleeding 10/02/2021 1406 by Syann Cupples L, LPN Outcome: Adequate for Discharge 10/02/2021 1406 by  Willamae Demby L, LPN Outcome: Adequate for Discharge   Problem: Clinical Measurements: Goal: Complications related to the disease process, condition or treatment will be avoided or minimized 10/02/2021 1406 by Jamilyn Pigeon L, LPN Outcome: Adequate for Discharge 10/02/2021 1406 by Alecxander Mainwaring L, LPN Outcome: Adequate for Discharge

## 2021-10-03 ENCOUNTER — Telehealth: Payer: Self-pay | Admitting: Gastroenterology

## 2021-10-03 ENCOUNTER — Telehealth: Payer: Self-pay

## 2021-10-03 NOTE — Telephone Encounter (Signed)
Inbound call from patient requesting to speak with a nurse please in regards to severe diarrhea she has been experiencing since this morning.  Please advise.

## 2021-10-03 NOTE — Telephone Encounter (Signed)
The pt states that the lactulose she was put on while recently hospitalized is causing her to have diarrhea.  We did discuss that this is an expected result.  She is having 2-3 very large diarrhea stools daily.  I tried to reassure her that this is expected result and advised her to call if the diarrhea gets more severe or if she is having dizziness.  FYI Dr Bryan Lemma.

## 2021-10-03 NOTE — Telephone Encounter (Signed)
Transition Care Management Unsuccessful Follow-up Telephone Call  Date of discharge and from where:  10/02/21 [facility]   Attempts:  1st Attempt  Reason for unsuccessful TCM follow-up call:  Left voice message

## 2021-10-04 LAB — CULTURE, BLOOD (ROUTINE X 2)
Culture: NO GROWTH
Culture: NO GROWTH
Special Requests: ADEQUATE
Special Requests: ADEQUATE

## 2021-10-06 ENCOUNTER — Telehealth: Payer: Self-pay | Admitting: Gastroenterology

## 2021-10-06 NOTE — Telephone Encounter (Signed)
Patient having a hard time with nausea and she believes it to be from the xifaxin. She is on her 10th day. She has phenergan and will start to take for nausea. She stated she is stopping the xifaxin and would like Dr De Hollingshead opinion if she needs to take it.

## 2021-10-06 NOTE — Telephone Encounter (Signed)
Inbound call from patient. States she have been taking xifaxan and it is making her sick to her stomach and nausea all day.

## 2021-10-07 ENCOUNTER — Other Ambulatory Visit: Payer: Self-pay

## 2021-10-07 MED ORDER — ONDANSETRON 4 MG PO TBDP: 4.0000 mg | ORAL_TABLET | Freq: Four times a day (QID) | ORAL | 0 refills | Status: DC | PRN

## 2021-10-07 NOTE — Telephone Encounter (Signed)
Tried to reach the patient by phone. No answer. Left her a very detailed message and asked she check her My Chart for the instructions. Rx to [facility]. I will try to reach the patient again tomorrow.

## 2021-10-07 NOTE — Telephone Encounter (Signed)
Dr Hilarie Fredrickson, The patient is complaining of nausea. Wants to stop all her medications, specifically Xifaxan, Lactulose and Avelox (which should been have finished a couple of days ago) She seems unclear about why she is taking the meds, tired of her nausea and trying to communicate through My Chart. I have made her an appointment for 10/21/21 which is the first available. Can we do anything for the nausea?

## 2021-10-07 NOTE — Progress Notes (Signed)
This is so bizarre.    "Visit Reason: Family history of intracranial aneurysm"    She has a large splenic artery aneurysm herself, her mother had intracranial aneurysms and her sister a popliteal artery aneurysm all of which required repair.   Due to her personal and strong family history of aneurysms, I ordered an MRA to screen for intracranial aneurysms.    This is all detailed in my consult note and plan.  How can this possibly be denied?  Signed,  Criselda Peaches, MD  Pager: [phone] Clinic: [phone]

## 2021-10-07 NOTE — Telephone Encounter (Signed)
Lets have her stop 1 thing at a time to determine whether the nausea improves Lactulose and rifaximin treat this same thing --hepatic encephalopathy --elevated ammonia levels related to her cirrhosis First I would stop the Avelox as the course has been completed If nausea continues I would neck stop the lactulose and continue the rifaximin 550 mg twice daily If nausea continues after stopping Avelox and lactulose then we can discuss stopping rifaximin and replacing with lactulose --she will very likely need either lactulose or rifaximin going forward perhaps long-term We can try ondansetron ODT 4 mg every 6 hours as needed nausea; this would likely be less sedating than promethazine/Phenergan  Are there any app appointments available before her appointment with me later this month?  If so it would likely be wise to put her in sooner given her recent issues and hospitalization.

## 2021-10-08 ENCOUNTER — Telehealth: Payer: Self-pay

## 2021-10-08 NOTE — Telephone Encounter (Signed)
Called the patient and spoke with her and her husband on speaker phone. The patient reports she has already stopped all the medications. She said she stopped the Lactulose last week due to the diarrhea. Through the conversation, I found she was also taking Dulcolax with the Lactulose. She reports she was having explosive diarrhea and was unable to leave the house because of this."I don't have time for this." She has continued the Dulcolax.  She stopped the Avalox and the Xifaxan yesterday. Reports "my nausea was immediately gone." She and her husband expressed concerns about being on Xifaxan long term. "It is an antibiotic and we have to be worried about resistance."  I spent about 20 minutes carefully explaining the mechanism of the Xifaxan, why it is being used and assuring her it was not for an infection. She still declines at this time. She will consider and hopefully try, taking just Lactulose and no other laxative or stool softener. We discussed how it is possible she will not need the otc bowel meds if she is on Lactulose.  Please advise on any part of this. I want to check on her again Friday. Thank you.

## 2021-10-09 ENCOUNTER — Other Ambulatory Visit: Payer: Self-pay

## 2021-10-09 MED ORDER — ONDANSETRON HCL 4 MG PO TABS: 4.0000 mg | ORAL_TABLET | Freq: Four times a day (QID) | ORAL | 0 refills | Status: DC | PRN

## 2021-10-09 NOTE — Telephone Encounter (Signed)
It is reasonable for her to remain off of lactulose and rifaximin for now She just needs to keep [place] updated about how she is feeling and if she is having trouble with abdominal pain, diarrhea, constipation, confusion, increased swelling in abdomen or legs  I will see her later this month

## 2021-10-13 ENCOUNTER — Encounter: Payer: Self-pay | Admitting: *Deleted

## 2021-10-15 ENCOUNTER — Other Ambulatory Visit: Payer: Self-pay

## 2021-10-15 ENCOUNTER — Ambulatory Visit
Admission: RE | Admit: 2021-10-15 | Discharge: 2021-10-15 | Disposition: A | Discharge status: Home / Self Care | Payer: Medicare PPO | Source: Ambulatory Visit | Attending: Radiology | Admitting: Radiology

## 2021-10-15 DIAGNOSIS — I728 Aneurysm of other specified arteries: Secondary | ICD-10-CM

## 2021-10-21 ENCOUNTER — Ambulatory Visit: Payer: Medicare PPO | Admitting: Internal Medicine

## 2021-10-21 ENCOUNTER — Encounter: Payer: Self-pay | Admitting: Internal Medicine

## 2021-10-21 VITALS — BP 98/52 | HR 83 | Ht 66.0 in | Wt 165.0 lb

## 2021-10-21 DIAGNOSIS — I728 Aneurysm of other specified arteries: Secondary | ICD-10-CM

## 2021-10-21 DIAGNOSIS — K7460 Unspecified cirrhosis of liver: Secondary | ICD-10-CM | POA: Diagnosis not present

## 2021-10-21 DIAGNOSIS — K5900 Constipation, unspecified: Secondary | ICD-10-CM | POA: Diagnosis not present

## 2021-10-21 DIAGNOSIS — R110 Nausea: Secondary | ICD-10-CM

## 2021-10-21 DIAGNOSIS — I81 Portal vein thrombosis: Secondary | ICD-10-CM

## 2021-10-21 DIAGNOSIS — K259 Gastric ulcer, unspecified as acute or chronic, without hemorrhage or perforation: Secondary | ICD-10-CM

## 2021-10-21 NOTE — Progress Notes (Signed)
Subjective:    Patient ID: Courtney Buckley, female    DOB: 1950-11-03, 70 y.o.   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  She is here today with her husband.  She underwent splenic artery aneurysm embolization on 1117 and then was admitted to the hospital from 09/28/2021 to 10/02/2021.  That was a complicated hospitalization, see discharge summary for details.  She required blood transfusion.  Underwent upper endoscopy and flexible sigmoidoscopy by Gerrit Heck on 09/30/2021.  EGD revealed cratered gastric ulcer in the prepyloric stomach.  Biopsies were obtained.  There was also a linear gastric ulcer in the gastric body.  The duodenum was normal.  She was started on twice daily pantoprazole and sucralfate which she has continued.  Flexible sigmoidoscopy was performed for generalized abdominal pain and a CT angio which suggested left-sided colitis.  This was largely unremarkable though somewhat limited by prep.  She was eventually discharged from the hospital on new medications including lactulose and rifaximin.  She was having significant diarrhea and has stopped these medications.  Today she reports she is feeling tremendously better.  Her abdominal pain has resolved.  She feels that it was likely peptic ulcer disease which was causing majority of her symptoms.  Imaging then led to the discovery of the splenic artery aneurysm which was repaired as above.  Her energy levels have improved though have not yet returned completely to normal.  She is eating well.   Again no abdominal pain.  She will have random nausea and did so this morning and took a Zofran with good result.  She has been on pantoprazole 40 mg twice daily and liquid Carafate 4 times a day.  Bowel movements are occurring a couple of times per day and have been without blood or melena.  She is taking a Dulcolax at bedtime nightly.  Again she has not used lactulose or rifaximin of late.  She is using the pantoprazole twice daily.  She is back on Jakafi.  Review of Systems As per HPI, otherwise negative  Current Medications, Allergies, Past Medical History, Past Surgical History, Family History and Social History were reviewed in [employer] record.    Objective:   Physical Exam BP (!) 98/52    Pulse 83    Ht 5\' 6"  (1.676 m)    Wt 165 lb (74.8 kg)    SpO2 97%    BMI 26.63 kg/m  Gen: awake, alert, NAD HEENT: anicteric CV: RRR, no mrg Pulm: CTA b/l Abd: soft, NT/ND, +BS throughout Ext: no c/c, trace pretibial edema Neuro: nonfocal, no asterixis  CBC Latest Ref Rng & Units 10/02/2021 10/01/2021 09/30/2021  WBC 4.0 - 10.5 K/uL 32.8(H) 34.1(H) 35.3(H)  Hemoglobin 12.0 - 15.0 g/dL 8.3(L) 8.3(L) 7.0(L)  Hematocrit 36.0 - 46.0 % 26.1(L) 25.6(L) 22.4(L)  Platelets 150 - 400 K/uL 469(H) 512(H) 750(H)   Lab Results  Component Value Date   INR 1.7 (H) 09/18/2021   INR 1.5 (H) 09/03/2021   INR 1.8 (H) 07/02/2021   CMP     Component Value Date/Time   NA 132 (L) 10/01/2021 0351   K 3.5 10/01/2021  0351   CL 105 10/01/2021 0351   CO2 24 10/01/2021 0351   GLUCOSE 101 (H) 10/01/2021 0351   BUN 14 10/01/2021 0351   CREATININE 0.59 10/01/2021 0351   CREATININE 0.61 09/13/2020 0829   CALCIUM 7.5 (L) 10/01/2021 0351   PROT 3.8 (L) 10/01/2021 0351   ALBUMIN 2.1 (L) 10/01/2021 0351   AST 20 10/01/2021 0351   AST 34 09/13/2020 0829   ALT 13 10/01/2021 0351   ALT 15 09/13/2020 0829   ALKPHOS 84 10/01/2021 0351   BILITOT 2.4 (H) 10/01/2021 0351   BILITOT 3.3 (H)  09/13/2020 0829   GFRNONAA >60 10/01/2021 0351   GFRNONAA >60 09/13/2020 0829      Assessment & Plan:  70 year old female with a history of cirrhosis with portal hypertension (history of varices, portal gastropathy and low volume abdominal ascites), chronic portal vein thrombus, history of PUD and recently discovered gastric ulcer, myelofibrosis on Jakafi, 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    Splenic artery aneurysm status post coil embolization --her splenic artery aneurysm has been coiled though she did suffer splenic artery infarction and was hospitalized several weeks later.  We discussed that the splenic artery aneurysm could have been causing her abdominal symptoms and the ulcer could have resulted from ischemia related to the embolization.  However it is possible that the ulcer was causing the symptoms in the first place.  Either way the aneurysm was high risk and needed repair which has been done. --In early February we will repeat abdominal imaging to follow-up after splenic artery embolization as well as recent splenic infarction  2.  Gastric ulcer --history of gastric ulcer, prior biopsies negative for H. pylori.  She has been on PPI and sucralfate.  Surveillance upper endoscopy recommended to ensure gastric ulcer healing and early February 2023 --Continue pantoprazole 40 mg twice daily until upper endoscopy --She can change sucralfate to 1 g 3 times daily before meals and at bedtime as needed and even discontinue if it is not needed --Avoid NSAIDs --She can use Zofran 4 to 8 mg every 8 hours as needed for nausea  3.  Descending colitis --likely artifact or possibly mild ischemia related to splenic artery coil embolization and splenic infarction.  She had colonoscopy without colitis in 2020.  Nothing for needed for this at present.  4.   Cirrhosis with history of portal hypertension/chronic portal venous thrombosis --she has history of portal hypertension including esophageal varices though there were no varices seen at recent EGD.  It is possible that after splenic artery embolization portal hypertension improved in the upper abdomen.  She went home from the hospital on lactulose and rifaximin though I am not convinced that she had hepatic encephalopathy.  She was critically ill when she presented to the hospital and this likely clouded the picture regarding her mental status.  We did discuss liver cirrhosis today and I recommended the following: --Hepatic encephalopathy --no evidence for encephalopathy.  She can remain off lactulose and rifaximin.  I have advised she and her husband to watch for evidence of sleepiness, confusion or change in mental status and notify me should this occur --Esophageal varices --previously seen by endoscopy but negative on recent EGD.  Consider surveillance upper endoscopy for variceal screening around February 2025 (2 years after last endoscopy, or sooner should decompensation occur and liver disease) --She was previously on nadolol but this  has been stopped --No evidence for ascites today; mild lower extremity edema which can be followed.  She is not currently on diuretics and I do not think she needs to be at this time --[facility] screening up-to-date with recent cross-sectional imaging; we are going to reimage the abdomen as discussed in #1 which will suffice for [facility] screening  5.  Chronic constipation --currently using Dulcolax 1 at bedtime which she can continue.  As discussed above we are not using lactulose as there is no definitive evidence of hepatic encephalopathy  6.  MDS --she is following with her hematologist oncologist and will have blood work done on 11/13/2021 --Follow-up blood work to be deferred to oncology hematology appointment upcoming  She will return for EGD as above in February and then  see me shortly thereafter

## 2021-10-21 NOTE — Patient Instructions (Addendum)
Continue pantoprazole 40 mg twice daily until your endoscopy.  Continue Zofran (ondansetron) as needed for nausea/vomiting.  Let's change your sucralfate dosing to four times daily as needed.  You may remain off of the Xifaxan and lactulose for now.  Please follow up with Dr Hilarie Fredrickson on 12/16/21 at 1:50pm in the office.  You have been scheduled for an endoscopy. Please follow written instructions given to you at your visit today. If you use inhalers (even only as needed), please bring them with you on the day of your procedure.  If you are age 39 or older, your body mass index should be between 23-30. Your Body mass index is 26.63 kg/m. If this is out of the aforementioned range listed, please consider follow up with your Primary Care Provider.  If you are age 19 or younger, your body mass index should be between 19-25. Your Body mass index is 26.63 kg/m. If this is out of the aformentioned range listed, please consider follow up with your Primary Care Provider.   ________________________________________________________  The [facility] providers would like to encourage you to use [facility] to communicate with providers for non-urgent requests or questions.  Due to long hold times on the telephone, sending your provider a message by [facility] may be a faster and more efficient way to get a response.  Please allow 48 business hours for a response.  Please remember that this is for non-urgent requests.  _______________________________________________________  Due to recent changes in healthcare laws, you may see the results of your imaging and laboratory studies on MyChart before your provider has had a chance to review them.  We understand that in some cases there may be results that are confusing or concerning to you. Not all laboratory results come back in the same time frame and the provider may be waiting for multiple results in order to interpret others.  Please give [place] 48 hours in order for your  provider to thoroughly review all the results before contacting the office for clarification of your results.

## 2021-10-29 ENCOUNTER — Encounter: Payer: Self-pay | Admitting: *Deleted

## 2021-10-30 ENCOUNTER — Encounter: Payer: Self-pay | Admitting: Internal Medicine

## 2021-10-31 ENCOUNTER — Other Ambulatory Visit: Payer: Self-pay

## 2021-10-31 ENCOUNTER — Telehealth: Payer: Self-pay | Admitting: Internal Medicine

## 2021-10-31 DIAGNOSIS — K259 Gastric ulcer, unspecified as acute or chronic, without hemorrhage or perforation: Secondary | ICD-10-CM

## 2021-10-31 MED ORDER — PANTOPRAZOLE SODIUM 40 MG PO TBEC: 40.0000 mg | DELAYED_RELEASE_TABLET | Freq: Two times a day (BID) | ORAL | 2 refills | Status: DC

## 2021-10-31 NOTE — Telephone Encounter (Signed)
This prescription was sent to patient pharmacy yesterday as reflected in the mychart message back to patient. It does not appear she has looked at that message yet.

## 2021-10-31 NOTE — Telephone Encounter (Signed)
Patient is doing a pantoprazole taper and has been taking 2 a day and is now down to 1 a day but has no more pills and no refills.  Can you please call in a refill to [facility] on [address]?  Thank you.

## 2021-11-13 DIAGNOSIS — D471 Chronic myeloproliferative disease: Secondary | ICD-10-CM | POA: Diagnosis not present

## 2021-11-13 DIAGNOSIS — R161 Splenomegaly, not elsewhere classified: Secondary | ICD-10-CM | POA: Diagnosis not present

## 2021-11-13 DIAGNOSIS — D75839 Thrombocytosis, unspecified: Secondary | ICD-10-CM | POA: Diagnosis not present

## 2021-11-13 DIAGNOSIS — I81 Portal vein thrombosis: Secondary | ICD-10-CM | POA: Diagnosis not present

## 2021-11-13 DIAGNOSIS — Z79899 Other long term (current) drug therapy: Secondary | ICD-10-CM | POA: Diagnosis not present

## 2021-11-13 DIAGNOSIS — K259 Gastric ulcer, unspecified as acute or chronic, without hemorrhage or perforation: Secondary | ICD-10-CM | POA: Diagnosis not present

## 2021-11-24 ENCOUNTER — Encounter: Payer: Self-pay | Admitting: Physical Therapy

## 2021-11-24 ENCOUNTER — Other Ambulatory Visit: Payer: Self-pay

## 2021-11-24 ENCOUNTER — Encounter: Payer: Self-pay | Admitting: Physician Assistant

## 2021-11-24 ENCOUNTER — Ambulatory Visit: Payer: Medicare PPO | Admitting: Physician Assistant

## 2021-11-24 ENCOUNTER — Ambulatory Visit: Payer: Medicare PPO | Admitting: Physical Therapy

## 2021-11-24 VITALS — BP 98/50 | HR 81 | Temp 98.1°F | Ht 66.0 in | Wt 174.2 lb

## 2021-11-24 DIAGNOSIS — D7581 Myelofibrosis: Secondary | ICD-10-CM

## 2021-11-24 DIAGNOSIS — R2689 Other abnormalities of gait and mobility: Secondary | ICD-10-CM | POA: Diagnosis not present

## 2021-11-24 DIAGNOSIS — M6281 Muscle weakness (generalized): Secondary | ICD-10-CM | POA: Diagnosis not present

## 2021-11-24 DIAGNOSIS — R1084 Generalized abdominal pain: Secondary | ICD-10-CM | POA: Diagnosis not present

## 2021-11-24 NOTE — Therapy (Signed)
OUTPATIENT PHYSICAL THERAPY LOWER EXTREMITY EVALUATION   Patient Name: Courtney Buckley MRN: [id number] DOB:[birth year], 71 y.o., female Today's Date: 11/24/2021   PT End of Session - 11/24/21 1403     Visit Number 1    Number of Visits 12    Date for PT Re-Evaluation 01/19/22    Authorization Type [employer]  8 wks    PT Start Time 0935    PT Stop Time 5176    PT Time Calculation (min) 40 min    Equipment Utilized During Treatment Gait belt    Activity Tolerance Patient tolerated treatment well    Behavior During Therapy WFL for tasks assessed/performed             Past Medical History:  Diagnosis Date   Aneurysm, splenic artery ([place])    Cancer ([place])    Bone Marrow Cancer   Cirrhosis ([place])    Colon polyps    Depression    Esophageal varices ([place])    Gastric ulcer    Gastritis    Internal hemorrhoids    Migraine    Portal hypertensive gastropathy ([facility])    PVT (portal vein thrombosis)    Past Surgical History:  Procedure Laterality Date   BIOPSY  09/30/2021   Procedure: BIOPSY;  Surgeon: Lavena Bullion, DO;  Location: [facility];  Service: Gastroenterology;;   ESOPHAGOGASTRODUODENOSCOPY (EGD) WITH PROPOFOL N/A 09/30/2021   Procedure: ESOPHAGOGASTRODUODENOSCOPY (EGD) WITH PROPOFOL;  Surgeon: Lavena Bullion, DO;  Location: [facility];  Service: Gastroenterology;  Laterality: N/A;   FLEXIBLE SIGMOIDOSCOPY N/A 09/30/2021   Procedure: FLEXIBLE SIGMOIDOSCOPY;  Surgeon: Lavena Bullion, DO;  Location: [facility];  Service: Gastroenterology;  Laterality: N/A;   IR ANGIOGRAM SELECTIVE EACH ADDITIONAL VESSEL  09/18/2021   IR ANGIOGRAM VISCERAL SELECTIVE  09/18/2021   IR EMBO ARTERIAL NOT HEMORR HEMANG INC GUIDE ROADMAPPING  09/18/2021   IR RADIOLOGIST EVAL & MGMT  09/09/2021   IR US GUIDE VASC ACCESS RIGHT  09/18/2021   LIVER BIOPSY     RADIOLOGY WITH ANESTHESIA N/A 09/18/2021   Procedure: IR WITH ANESTHESIA SPLENIC ARTERY EMBOLIZATION;  Surgeon:  Criselda Peaches, MD;  Location: [facility];  Service: Radiology;  Laterality: N/A;   Patient Active Problem List   Diagnosis Date Noted   Acute blood loss anemia    Grade II internal hemorrhoids    Gastric ulcer without hemorrhage or perforation    Colitis 09/29/2021   Leukocytosis 09/29/2021   Anemia 09/29/2021   Thrombocytosis 09/29/2021   Generalized abdominal pain    Splenic artery aneurysm ([place]) 09/18/2021   Myelofibrosis ([place]) 10/04/2020    PCP: Fredirick Lathe, PA-C  REFERRING PROVIDER: Allwardt, Randa Evens, PA-C  REFERRING DIAG: Weakness, Gait instability   THERAPY DIAG:  Muscle weakness (generalized)  Other abnormalities of gait and mobility  ONSET DATE: 1 yr ago  SUBJECTIVE:   SUBJECTIVE STATEMENT: Saw Dr. In September, initial PT referral placed then, but pt reports having other medical issues that have limited her until now. She had recent surgery in Novemenber, for ulcer and Aneurysm (splenic artery) . States some residual soreness in L abdomen from this. 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  States After recent hospitalization, was very weak,now feels about 98 % recovered. She has some concern for her balance and stability, and overall conditioning. She wants to be more active with exercise and grandkids. She Denies SOB, dizziness. No AD.  Lives alone, no stairs. Likes to walk, is able to  do 3x/wk , 30 min now.   PERTINENT HISTORY:  Recent surgery: November 2022. Aneurysm, splenic artery ([facility])   Cancer ([place])   Bone Marrow Cancer    PAIN:  Are you having pain? Yes NPRS scale: 5/10 Pain location: abdomen  Pain orientation: Left  PAIN TYPE: aching Pain description: intermittent and dull  Aggravating factors: none , from recent surgery  Relieving factors: none stated   PRECAUTIONS: None  WEIGHT BEARING RESTRICTIONS No  FALLS: Has patient fallen in last 6 months? No, Number of falls: 0   PLOF: Independent  PATIENT GOALS   return to more physical activity, walking, exercise, and improved balance.    OBJECTIVE:   COGNITION:  Overall cognitive status: Within functional limits for tasks assessed    LE AROM/PROM:  Limited DF and EV for L ankle   LE MMT:   Hips: 4-/5, Knees: 4/5, L ankle: 4/5,     FUNCTIONAL TESTS:  Sit to stand: able to perform without UE support  Stairs; able to perform recipricol, 6 in, 1 HR.    [facility] PT Assessment - 11/24/21 0001       Standardized Balance Assessment   Standardized Balance Assessment Dynamic Gait Index      Dynamic Gait Index   Level Surface Mild Impairment    Change in Gait Speed Mild Impairment    Gait with Horizontal Head Turns Mild Impairment    Gait with Vertical Head Turns Mild Impairment    Gait and Pivot Turn Mild Impairment    Step Over Obstacle Mild Impairment    Step Around Obstacles Mild Impairment    Steps Mild Impairment    Total Score 16                TODAY'S TREATMENT: 11/24/21:  Therapeutic Exercise:  Aerobic: Supine: Ankle ROM: PF/DF, Inv/Ev x 10 ea;  Seated: Standing:  Heel raises x 15 at counter, Marching at counter x 15;  Stretches:   Neuromuscular Re-education: Manual Therapy: Therapeutic Activity: Self Care: Trigger Point Dry Needling:  Modalities:     PATIENT EDUCATION:  Education details: PT POC, Exam findings, HEP Person educated: Patient Education method: Explanation, Demonstration, Tactile cues, Verbal cues, and Handouts Education comprehension: verbalized understanding, returned demonstration, verbal cues required, tactile cues required, and needs further education   HOME EXERCISE PROGRAM: Access Code: [facility] URL: [email or web address] Date: 11/24/2021 Prepared by: Lyndee Hensen  Exercises Sit to Stand - 2 x daily - 1 sets - 10 reps Heel Raises with Counter Support - 1 x daily - 1-2 sets - 10 reps Supine Ankle Dorsiflexion and Plantarflexion AROM - 2 x daily - 2 sets - 10  reps Supine Ankle Inversion and Eversion AROM - 2 x daily - 1-2 sets - 10 reps Standing March with Counter Support - 1 x daily - 2 sets - 10 reps   ASSESSMENT:  CLINICAL IMPRESSION:  11/24/21:   Pt presents with primary complaint of weakness and deconditioning following hospitalization and surgery. She has some ROM and strength limitations at L ankle, as well as chronic swelling and dysfunction from previous injury/sprains. She has decreased LE strength and lack of effective HEP. She does have decreased dynamic balance, ability for stairs, and confidence with functional activity. She will benefit from skilled PT for LE Strength, HEP, balance, and to improve safety and efficiency with functional mobility.   Objective impairments include Abnormal gait, decreased activity tolerance, decreased balance, decreased coordination, decreased endurance, decreased knowledge of use of DME, decreased mobility, difficulty  walking, decreased strength, decreased safety awareness, increased muscle spasms, and impaired flexibility. These impairments are limiting patient from cleaning, community activity, driving, laundry, yard work, and shopping. Personal factors including Past/current experiences and 1 comorbidity: recent surgery  are also affecting patient's functional outcome. Patient will benefit from skilled PT to address above impairments and improve overall function.  REHAB POTENTIAL: Good  CLINICAL DECISION MAKING: Stable/uncomplicated  EVALUATION COMPLEXITY: Low   GOALS: Goals reviewed with patient? Yes   SHORT TERM GOALS:  STG Name Target Date Goal status  1 Patient will be independent with initial HEP   12/08/21 INITIAL  2            LONG TERM GOALS:   LTG Name Target Date Goal status  1 Patient will be independent with final HEP  01/19/22 INITIAL       2 Pt to demonstrate improved strength of L ankle and bil hips/knees to be at least 4+/5, to improve stability and ability for gait   01/19/22 INITIAL  3 Pt to demo improved score on DGI by at least 5 points, to improve dynamic stability and fall risk.   01/19/22 INITIAL  4 Pt to demo ability for stair navigation, reciprocal pattern, without  support, to improve ability for home and community navigation.   01/19/22 INITIAL     PLAN: PT FREQUENCY: 1-2x/week  PT DURATION: 8 weeks  PLANNED INTERVENTIONS: Therapeutic exercises, Therapeutic activity, Neuro Muscular re-education, Balance training, Gait training, Patient/Family education, Joint mobilization, Stair training, Visual/preceptual remediation/compensation, DME instructions, Dry Needling, Spinal mobilization, Cryotherapy, Moist heat, Taping, Traction, Ultrasound, Ionotophoresis 4mg /ml Dexamethasone, and Manual therapy  PLAN FOR NEXT SESSION: LE strength, dynamic balance, stairs.   Lyndee Hensen, PT, DPT 2:26 PM  11/24/21

## 2021-11-24 NOTE — Progress Notes (Signed)
Subjective:    Patient ID: Courtney Buckley, female    DOB: [birth year], 71 y.o.   MRN: [id number]  No chief complaint on file.   HPI Patient is in today for follow up. Since our last visit, patient has undergone  splenic artery emobolization, and EGD with discovery of gastric ulcer.  Denies any pain. Feels a lot better and able to eat anything she wants. Still taking Protonix 40 mg BID.  Last visit with Dr. Joan Mayans was 11/13/2021. She will have recheck in one month. She has continued Ruxolitinib 10 mg po bid.   She has repeat endoscopy scheduled with Dr. Hilarie Fredrickson on 12/04/2021.  DEXA scan scheduled for 12/12/2021  No other concerns to address today.   Past Medical History:  Diagnosis Date   Aneurysm, splenic artery (HCC)    Cancer (HCC)    Bone Marrow Cancer   Cirrhosis (HCC)    Colon polyps    Depression    Esophageal varices (HCC)    Gastric ulcer    Gastritis    Internal hemorrhoids    Migraine    Portal hypertensive gastropathy (HCC)    PVT (portal vein thrombosis)     Past Surgical History:  Procedure Laterality Date   BIOPSY  09/30/2021   Procedure: BIOPSY;  Surgeon: Lavena Bullion, DO;  Location: [facility];  Service: Gastroenterology;;   ESOPHAGOGASTRODUODENOSCOPY (EGD) WITH PROPOFOL N/A 09/30/2021   Procedure: ESOPHAGOGASTRODUODENOSCOPY (EGD) WITH PROPOFOL;  Surgeon: Lavena Bullion, DO;  Location: [facility];  Service: Gastroenterology;  Laterality: N/A;   FLEXIBLE SIGMOIDOSCOPY N/A 09/30/2021   Procedure: FLEXIBLE SIGMOIDOSCOPY;  Surgeon: Lavena Bullion, DO;  Location: [facility];  Service: Gastroenterology;  Laterality: N/A;   IR ANGIOGRAM SELECTIVE EACH ADDITIONAL VESSEL  09/18/2021   IR ANGIOGRAM VISCERAL SELECTIVE  09/18/2021   IR EMBO ARTERIAL NOT HEMORR HEMANG INC GUIDE ROADMAPPING  09/18/2021   IR RADIOLOGIST EVAL & MGMT  09/09/2021   IR US GUIDE VASC ACCESS RIGHT  09/18/2021   LIVER BIOPSY     RADIOLOGY WITH ANESTHESIA N/A 09/18/2021    Procedure: IR WITH ANESTHESIA SPLENIC ARTERY EMBOLIZATION;  Surgeon: Criselda Peaches, MD;  Location: [facility];  Service: Radiology;  Laterality: N/A;    Family History  Problem Relation Age of Onset   Cancer - Lung Father    Diabetes Mellitus II Maternal Aunt    Stomach cancer Maternal Aunt    Diabetes Mellitus II Maternal Uncle    Colon cancer Neg Hx    Rectal cancer Neg Hx    Esophageal cancer Neg Hx    Liver disease Neg Hx    Pancreatic cancer Neg Hx     Social History   Tobacco Use   Smoking status: Former    Types: Cigarettes   Smokeless tobacco: Never  Vaping Use   Vaping Use: Never used  Substance Use Topics   Alcohol use: Not Currently    Comment: Occasionally-1 acoholic beverage every 1-2 weeks   Drug use: No     No Known Allergies  Review of Systems NEGATIVE UNLESS OTHERWISE INDICATED IN HPI      Objective:     BP (!) 98/50    Pulse 81    Temp 98.1 F (36.7 C)    Ht 5\' 6"  (1.676 m)    Wt 174 lb 3.2 oz (79 kg)    SpO2 98%    BMI 28.12 kg/m   Wt Readings from Last 3 Encounters:  11/24/21 174 lb 3.2 oz (  79 kg)  10/21/21 165 lb (74.8 kg)  09/28/21 180 lb (81.6 kg)    BP Readings from Last 3 Encounters:  11/24/21 (!) 98/50  10/21/21 (!) 98/52  10/02/21 118/65     Physical Exam Vitals and nursing note reviewed.  Constitutional:      Appearance: Normal appearance. She is normal weight. She is not toxic-appearing.  HENT:     Head: Normocephalic and atraumatic.     Right Ear: External ear normal.     Left Ear: External ear normal.     Nose: Nose normal.     Mouth/Throat:     Mouth: Mucous membranes are moist.  Eyes:     Extraocular Movements: Extraocular movements intact.     Conjunctiva/sclera: Conjunctivae normal.     Pupils: Pupils are equal, round, and reactive to light.  Cardiovascular:     Rate and Rhythm: Normal rate and regular rhythm.     Pulses: Normal pulses.     Heart sounds: Murmur (stable murmur) heard.  Pulmonary:      Effort: Pulmonary effort is normal.     Breath sounds: Normal breath sounds.  Abdominal:     General: Abdomen is flat. Bowel sounds are normal.     Palpations: Abdomen is soft.     Tenderness: There is no abdominal tenderness.  Musculoskeletal:        General: Normal range of motion.     Cervical back: Normal range of motion and neck supple.     Right lower leg: Edema (1+ pitting) present.     Left lower leg: Edema (2+ pitting) present.  Skin:    General: Skin is warm and dry.  Neurological:     General: No focal deficit present.     Mental Status: She is alert and oriented to person, place, and time.  Psychiatric:        Mood and Affect: Mood normal.        Behavior: Behavior normal.        Thought Content: Thought content normal.        Judgment: Judgment normal.       Assessment & Plan:   Problem List Items Addressed This Visit       Other   Myelofibrosis ([place])   Generalized abdominal pain - Primary     1. Generalized abdominal pain -Resolved, thankfully! -Ended up being a result of gastric ulcer. This has been treated well with Protonix 40 mg BID. -She will f/up with Dr. Hilarie Fredrickson.  2. Myelofibrosis ([place]) -I reviewed note from Dr. Joan Mayans, she will continue current dose of Jakafi and f/up in 1 month  Plan to f/up with me in 6 months, or prn    This note was prepared with assistance of Dragon voice recognition software. Occasional wrong-word or sound-a-like substitutions may have occurred due to the inherent limitations of voice recognition software.  Time Spent: 27 minutes of total time was spent on the date of the encounter performing the following actions: chart review prior to seeing the patient, obtaining history, performing a medically necessary exam, counseling on the treatment plan, placing orders, and documenting in our EHR.    Valeria Krisko M Croix Presley, PA-C

## 2021-11-27 NOTE — Addendum Note (Signed)
Addended by: Lyndee Hensen on: 11/27/2021 08:18 AM   Modules accepted: Orders

## 2021-12-03 ENCOUNTER — Encounter: Payer: Medicare PPO | Admitting: Physical Therapy

## 2021-12-04 ENCOUNTER — Encounter: Payer: Self-pay | Admitting: Internal Medicine

## 2021-12-04 ENCOUNTER — Ambulatory Visit (AMBULATORY_SURGERY_CENTER): Payer: Medicare PPO | Admitting: Internal Medicine

## 2021-12-04 ENCOUNTER — Other Ambulatory Visit: Payer: Self-pay

## 2021-12-04 VITALS — BP 116/61 | HR 66 | Temp 98.4°F | Resp 15 | Ht 66.0 in | Wt 165.0 lb

## 2021-12-04 DIAGNOSIS — K259 Gastric ulcer, unspecified as acute or chronic, without hemorrhage or perforation: Secondary | ICD-10-CM | POA: Diagnosis not present

## 2021-12-04 DIAGNOSIS — Z8719 Personal history of other diseases of the digestive system: Secondary | ICD-10-CM | POA: Diagnosis not present

## 2021-12-04 DIAGNOSIS — K3189 Other diseases of stomach and duodenum: Secondary | ICD-10-CM | POA: Diagnosis not present

## 2021-12-04 DIAGNOSIS — K319 Disease of stomach and duodenum, unspecified: Secondary | ICD-10-CM | POA: Diagnosis not present

## 2021-12-04 DIAGNOSIS — K7460 Unspecified cirrhosis of liver: Secondary | ICD-10-CM | POA: Diagnosis not present

## 2021-12-04 MED ORDER — SODIUM CHLORIDE 0.9 % IV SOLN: 500.0000 mL | Freq: Once | INTRAVENOUS | Status: DC

## 2021-12-04 NOTE — Progress Notes (Signed)
GASTROENTEROLOGY PROCEDURE H&P NOTE   Primary Care Physician: Allwardt, Randa Evens, PA-C    Reason for Procedure:  Follow-up gastric ulcer  Plan:    EGD  Patient is appropriate for endoscopic procedure(s) in the ambulatory ([facility]) setting.  The nature of the procedure, as well as the risks, benefits, and alternatives were carefully and thoroughly reviewed with the patient. Ample time for discussion and questions allowed. The patient understood, was satisfied, and agreed to proceed.     HPI: Courtney Buckley is a 71 y.o. female who presents for EGD to follow-up gastric ulcer.  Medical history as below.  Also personal history of cirrhosis with portal hypertension.  See office note dated 10/21/2021 for details.  No recent chest pain or shortness of breath.  She has had a cold of late which she got from her grandson.  No issues with nasal congestion or trouble breathing.  Past Medical History:  Diagnosis Date   Aneurysm, splenic artery (HCC)    Cancer (HCC)    Bone Marrow Cancer   Cirrhosis (HCC)    Colon polyps    Depression    Esophageal varices (HCC)    Gastric ulcer    Gastritis    Internal hemorrhoids    Migraine    Portal hypertensive gastropathy (HCC)    PVT (portal vein thrombosis)     Past Surgical History:  Procedure Laterality Date   BIOPSY  09/30/2021   Procedure: BIOPSY;  Surgeon: Lavena Bullion, DO;  Location: [facility];  Service: Gastroenterology;;   ESOPHAGOGASTRODUODENOSCOPY (EGD) WITH PROPOFOL N/A 09/30/2021   Procedure: ESOPHAGOGASTRODUODENOSCOPY (EGD) WITH PROPOFOL;  Surgeon: Lavena Bullion, DO;  Location: [facility];  Service: Gastroenterology;  Laterality: N/A;   FLEXIBLE SIGMOIDOSCOPY N/A 09/30/2021   Procedure: FLEXIBLE SIGMOIDOSCOPY;  Surgeon: Lavena Bullion, DO;  Location: [facility];  Service: Gastroenterology;  Laterality: N/A;   IR ANGIOGRAM SELECTIVE EACH ADDITIONAL VESSEL  09/18/2021   IR ANGIOGRAM VISCERAL SELECTIVE   09/18/2021   IR EMBO ARTERIAL NOT HEMORR HEMANG INC GUIDE ROADMAPPING  09/18/2021   IR RADIOLOGIST EVAL & MGMT  09/09/2021   IR US GUIDE VASC ACCESS RIGHT  09/18/2021   LIVER BIOPSY     RADIOLOGY WITH ANESTHESIA N/A 09/18/2021   Procedure: IR WITH ANESTHESIA SPLENIC ARTERY EMBOLIZATION;  Surgeon: Criselda Peaches, MD;  Location: [facility];  Service: Radiology;  Laterality: N/A;    Prior to Admission medications   Medication Sig Start Date End Date Taking? Authorizing Provider  JAKAFI 10 MG tablet Take 10 mg by mouth 2 (two) times daily. 06/11/21  Yes [provider]  Multiple Vitamin (MULTI VITAMIN PO) Take 1 tablet by mouth daily.   Yes [provider]  ondansetron (ZOFRAN) 4 MG tablet Take 1 tablet (4 mg total) by mouth every 6 (six) hours as needed for nausea or vomiting. 10/09/21  Yes Janete Quilling, Lajuan Lines, MD  pantoprazole (PROTONIX) 40 MG tablet Take 1 tablet (40 mg total) by mouth 2 (two) times daily. 10/31/21  Yes Alen Matheson, Lajuan Lines, MD    Current Outpatient Medications  Medication Sig Dispense Refill   JAKAFI 10 MG tablet Take 10 mg by mouth 2 (two) times daily.     Multiple Vitamin (MULTI VITAMIN PO) Take 1 tablet by mouth daily.     ondansetron (ZOFRAN) 4 MG tablet Take 1 tablet (4 mg total) by mouth every 6 (six) hours as needed for nausea or vomiting. 30 tablet 0   pantoprazole (PROTONIX) 40 MG tablet Take  1 tablet (40 mg total) by mouth 2 (two) times daily. 60 tablet 2   Current Facility-Administered Medications  Medication Dose Route Frequency Provider Last Rate Last Admin   0.9 %  sodium chloride infusion  500 mL Intravenous Once Shavy Beachem, Lajuan Lines, MD        Allergies as of 12/04/2021   (No Known Allergies)    Family History  Problem Relation Age of Onset   Cancer - Lung Father    Diabetes Mellitus II Maternal Aunt    Stomach cancer Maternal Aunt    Diabetes Mellitus II Maternal Uncle    Colon cancer Neg Hx    Rectal cancer Neg Hx    Esophageal cancer Neg Hx     Liver disease Neg Hx    Pancreatic cancer Neg Hx     Social History   Socioeconomic History   Marital status: Significant Other    Spouse name: Not on file   Number of children: 2   Years of education: Not on file   Highest education level: Not on file  Occupational History   Not on file  Tobacco Use   Smoking status: Former    Types: Cigarettes   Smokeless tobacco: Never  Vaping Use   Vaping Use: Never used  Substance and Sexual Activity   Alcohol use: Not Currently    Comment: Occasionally-1 acoholic beverage every 1-2 weeks   Drug use: No   Sexual activity: Not on file  Other Topics Concern   Not on file  Social History Narrative   Not on file   Social Determinants of Health   Financial Resource Strain: Low Risk    Difficulty of Paying Living Expenses: Not hard at all  Food Insecurity: No Food Insecurity   Worried About Charity fundraiser in the Last Year: Never true   Ran Out of Food in the Last Year: Never true  Transportation Needs: No Transportation Needs   Lack of Transportation (Medical): No   Lack of Transportation (Non-Medical): No  Physical Activity: Insufficiently Active   Days of Exercise per Week: 3 days   Minutes of Exercise per Session: 20 min  Stress: No Stress Concern Present   Feeling of Stress : Only a little  Social Connections: Moderately Isolated   Frequency of Communication with Friends and Family: More than three times a week   Frequency of Social Gatherings with Friends and Family: More than three times a week   Attends Religious Services: Never   Marine scientist or Organizations: No   Attends Music therapist: Never   Marital Status: Married  Human resources officer Violence: Not At Risk   Fear of Current or Ex-Partner: No   Emotionally Abused: No   Physically Abused: No   Sexually Abused: No    Physical Exam: Vital signs in last 24 hours: @BP  113/65    Pulse 72    Temp 98.4 F (36.9 C) (Temporal)    Ht 5\' 6"   (1.676 m)    Wt 165 lb (74.8 kg)    BMI 26.63 kg/m  GEN: NAD EYE: Sclerae anicteric ENT: MMM CV: Non-tachycardic Pulm: CTA b/l GI: Soft, NT/ND NEURO:  Alert & Oriented x 3   Zenovia Jarred, MD [facility]  12/04/2021 10:14 AM

## 2021-12-04 NOTE — Progress Notes (Signed)
Report to PACU, RN, vss, BBS= Clear.  

## 2021-12-04 NOTE — Progress Notes (Signed)
Called to room to assist during endoscopic procedure.  Patient ID and intended procedure confirmed with present staff. Received instructions for my participation in the procedure from the performing physician.  

## 2021-12-04 NOTE — Patient Instructions (Signed)
Take your medication once per day 1/2 hour before a meal.  A CT will  be ordered by our office, and they will set up an office visit in 4 months for you.  You will need a repeat Endoscopy in 2 years.  We will send you a reminder.  YOU HAD AN ENDOSCOPIC PROCEDURE TODAY AT [facility]:   Refer to the procedure report that was given to you for any specific questions about what was found during the examination.  If the procedure report does not answer your questions, please call your gastroenterologist to clarify.  If you requested that your care partner not be given the details of your procedure findings, then the procedure report has been included in a sealed envelope for you to review at your convenience later.  YOU SHOULD EXPECT: Some feelings of bloating in the abdomen. Passage of more gas than usual.  Walking can help get rid of the air that was put into your GI tract during the procedure and reduce the bloating.   Please Note:  You might notice some irritation and congestion in your nose or some drainage.  This is from the oxygen used during your procedure.  There is no need for concern and it should clear up in a day or so.  SYMPTOMS TO REPORT IMMEDIATELY:   Following upper endoscopy (EGD)  Vomiting of blood or coffee ground material  New chest pain or pain under the shoulder blades  Painful or persistently difficult swallowing  New shortness of breath  Fever of 100F or higher  Black, tarry-looking stools  For urgent or emergent issues, a gastroenterologist can be reached at any hour by calling [phone]. Do not use MyChart messaging for urgent concerns.    DIET:  We do recommend a small meal at first, but then you may proceed to your regular diet.  Drink plenty of fluids but you should avoid alcoholic beverages for 24 hours.  ACTIVITY:  You should plan to take it easy for the rest of today and you should NOT DRIVE or use heavy machinery until tomorrow (because of  the sedation medicines used during the test).    FOLLOW UP: Our staff will call the number listed on your records 48-72 hours following your procedure to check on you and address any questions or concerns that you may have regarding the information given to you following your procedure. If we do not reach you, we will leave a message.  We will attempt to reach you two times.  During this call, we will ask if you have developed any symptoms of COVID 19. If you develop any symptoms (ie: fever, flu-like symptoms, shortness of breath, cough etc.) before then, please call [phone].  If you test positive for Covid 19 in the 2 weeks post procedure, please call and report this information to [place].    If any biopsies were taken you will be contacted by phone or by letter within the next 1-3 weeks.  Please call us at [phone] if you have not heard about the biopsies in 3 weeks.    SIGNATURES/CONFIDENTIALITY: You and/or your care partner have signed paperwork which will be entered into your electronic medical record.  These signatures attest to the fact that that the information above on your After Visit Summary has been reviewed and is understood.  Full responsibility of the confidentiality of this discharge information lies with you and/or your care-partner.

## 2021-12-04 NOTE — Op Note (Signed)
[facility] Patient Name: Narya Beavin Procedure Date: 12/04/2021 10:16 AM MRN: [id number] Endoscopist: Jerene Bears , MD Age: 71 Referring MD:  Date of Birth: August 20, 1951 Gender: Female Account #: [id number] Procedure:                Upper GI endoscopy Indications:              Follow-up of acute gastric ulcer seen at EGD in                            late Nov 2022 (bx neg for dysplasia and H. Pylori                            at that time), patient has been on BID PPI, also                            personal history of cirrhosis with known portal HTN Medicines:                Monitored Anesthesia Care Procedure:                Pre-Anesthesia Assessment:                           - Prior to the procedure, a History and Physical                            was performed, and patient medications and                            allergies were reviewed. The patient's tolerance of                            previous anesthesia was also reviewed. The risks                            and benefits of the procedure and the sedation                            options and risks were discussed with the patient.                            All questions were answered, and informed consent                            was obtained. Prior Anticoagulants: The patient has                            taken no previous anticoagulant or antiplatelet                            agents. ASA Grade Assessment: II - A patient with                            mild systemic disease. After reviewing the risks  and benefits, the patient was deemed in                            satisfactory condition to undergo the procedure.                           After obtaining informed consent, the endoscope was                            passed under direct vision. Throughout the                            procedure, the patient's blood pressure, pulse, and                             oxygen saturations were monitored continuously. The                            GIF [id number] #[id number] was introduced through the                            mouth, and advanced to the second part of duodenum.                            The upper GI endoscopy was accomplished without                            difficulty. The patient tolerated the procedure                            well. Scope In: Scope Out: Findings:                 The examined esophagus was normal. No evidence of                            esophageal varices today.                           Nodular mucosa was found in the prepyloric region                            of the stomach. This is the likely site of healed                            gastric ulcer. Biopsies were taken with a cold                            forceps for histology to exclude adenomatous change.                           The exam of the stomach was otherwise normal.                           The examined  duodenum was normal. Complications:            No immediate complications. Estimated Blood Loss:     Estimated blood loss: none. Impression:               - Normal esophagus.                           - Nodular mucosa in the prepyloric region of the                            stomach at site of now healed ulcer. Biopsied.                           - Normal examined duodenum. Recommendation:           - Patient has a contact number available for                            emergencies. The signs and symptoms of potential                            delayed complications were discussed with the                            patient. Return to normal activities tomorrow.                            Written discharge instructions were provided to the                            patient.                           - Resume previous diet.                           - Continue present medications. Can decrease                            pantoprazole to 40 mg once  daily.                           - Await pathology results.                           - Repeat upper endoscopy in 2 years for screening                            purposes.                           - Repeat CT scan abd/pelvis with contrast to be                            scheduled to follow-up now treated splenic artery  aneurysm in setting of portal HTN and cirrhosis.                           - Office follow-up with me in 4-6 months. Jerene Bears, MD 12/04/2021 10:35:25 AM This report has been signed electronically.

## 2021-12-04 NOTE — Progress Notes (Signed)
Pt's states no medical or surgical changes since previsit or office visit.  ° °VS DT °

## 2021-12-05 ENCOUNTER — Other Ambulatory Visit: Payer: Self-pay

## 2021-12-05 DIAGNOSIS — I728 Aneurysm of other specified arteries: Secondary | ICD-10-CM

## 2021-12-08 ENCOUNTER — Telehealth: Payer: Self-pay | Admitting: *Deleted

## 2021-12-08 ENCOUNTER — Telehealth: Payer: Self-pay

## 2021-12-08 NOTE — Telephone Encounter (Signed)
Called [phone] and left a message we tried to reach pt for a follow up call. maw

## 2021-12-08 NOTE — Telephone Encounter (Signed)
°  Follow up Call-  Call back number 12/04/2021 07/11/2020  Post procedure Call Back phone  # [phone]  Permission to leave phone message Yes Yes  Some recent data might be hidden     Patient questions:  Do you have a fever, pain , or abdominal swelling? No. Pain Score  0 *  Have you tolerated food without any problems? Yes.    Have you been able to return to your normal activities? Yes.    Do you have any questions about your discharge instructions: Diet   No. Medications  No. Follow up visit  No.  Do you have questions or concerns about your Care? No.  Actions: * If pain score is 4 or above: No action needed, pain <4.  Have you developed a fever since your procedure? no  2.   Have you had an respiratory symptoms (SOB or cough) since your procedure? no  3.   Have you tested positive for COVID 19 since your procedure no  4.   Have you had any family members/close contacts diagnosed with the COVID 19 since your procedure?  no   If yes to any of these questions please route to Joylene John, RN and Joella Prince, RN

## 2021-12-10 ENCOUNTER — Encounter: Payer: Self-pay | Admitting: Physical Therapy

## 2021-12-10 ENCOUNTER — Other Ambulatory Visit: Payer: Self-pay

## 2021-12-10 ENCOUNTER — Ambulatory Visit: Payer: Medicare PPO | Admitting: Physical Therapy

## 2021-12-10 DIAGNOSIS — R2689 Other abnormalities of gait and mobility: Secondary | ICD-10-CM | POA: Diagnosis not present

## 2021-12-10 DIAGNOSIS — M6281 Muscle weakness (generalized): Secondary | ICD-10-CM

## 2021-12-10 NOTE — Therapy (Signed)
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[place])    Cancer ([place])    Bone Marrow Cancer   Cirrhosis ([place])    Colon polyps    Depression    Esophageal varices ([place])    Gastric ulcer    Gastritis    Internal hemorrhoids    Migraine    Portal hypertensive gastropathy (HCC)    PVT (portal vein thrombosis)    Past Surgical History:  Procedure Laterality Date   BIOPSY  09/30/2021   Procedure: BIOPSY;  Surgeon: Lavena Bullion, DO;  Location: [facility];  Service: Gastroenterology;;   ESOPHAGOGASTRODUODENOSCOPY (EGD) WITH PROPOFOL N/A 09/30/2021   Procedure: ESOPHAGOGASTRODUODENOSCOPY (EGD) WITH PROPOFOL;  Surgeon: Lavena Bullion, DO;  Location: [place];  Service: Gastroenterology;  Laterality: N/A;   FLEXIBLE SIGMOIDOSCOPY N/A 09/30/2021   Procedure: FLEXIBLE SIGMOIDOSCOPY;  Surgeon: Lavena Bullion, DO;  Location: [place];  Service: Gastroenterology;  Laterality: N/A;   IR ANGIOGRAM SELECTIVE EACH ADDITIONAL VESSEL  09/18/2021   IR ANGIOGRAM VISCERAL SELECTIVE  09/18/2021   IR EMBO ARTERIAL NOT HEMORR HEMANG INC GUIDE ROADMAPPING  09/18/2021   IR RADIOLOGIST EVAL & MGMT  09/09/2021   IR US GUIDE VASC ACCESS RIGHT  09/18/2021   LIVER BIOPSY     RADIOLOGY WITH ANESTHESIA N/A 09/18/2021   Procedure: IR WITH ANESTHESIA SPLENIC ARTERY EMBOLIZATION;  Surgeon: Criselda Peaches, MD;   Location: [place];  Service: Radiology;  Laterality: N/A;   Patient Active Problem List   Diagnosis Date Noted   Acute blood loss anemia    Grade II internal hemorrhoids    Gastric ulcer without hemorrhage or perforation    Colitis 09/29/2021   Leukocytosis 09/29/2021   Anemia 09/29/2021   Thrombocytosis 09/29/2021   Generalized abdominal pain    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 Abdominal pain and energy level both improving.   PERTINENT HISTORY:  Recent surgery: November 2022. Aneurysm, splenic artery (HCC)   Cancer ([place])   Bone Marrow Cancer    PAIN:  Are you having pain? Yes NPRS scale: 5/10 Pain location: abdomen  Pain orientation: Left  PAIN TYPE: aching Pain description: intermittent and dull  Aggravating factors: none , from recent surgery  Relieving factors: none stated   PRECAUTIONS: None  PLOF: Independent  PATIENT GOALS  return to more physical activity, walking, exercise, and improved balance.    OBJECTIVE:  COGNITION:  Overall cognitive status: Within functional limits for tasks assessed    11/24/21 LE AROM/PROM: Limited DF and EV for L ankle LE MMT:   Hips: 4-/5, Knees: 4/5, L ankle: 4/5, FUNCTIONAL TESTS:  Sit to stand: able to perform without UE support  Stairs; able to perform recipricol, 6 in, 1 HR.     TODAY'S TREATMENT:  12/10/21 Therapeutic Exercise: Aerobic:  Recumbent bike L1 x 5 min;  Supine:  Bridging x 20;  Hip abd 2 x10 bil;  TA contraction x 10 with education on active contraction Seated:  Sit to stand x 10  Standing:  Heel raises x 20 at  counter,  Marching x 20 ;  Fwd Step ups 6 in x 10 bil, 1 UE support.  Stretches:     11/24/21: Therapeutic Exercise: Aerobic: Supine: Ankle ROM: PF/DF, Inv/Ev x 10 ea;  Seated: Standing:  Heel raises x 15 at counter, Marching at counter x 15;  Stretches:    PATIENT EDUCATION:  Education details: updated and reviewed HEP Person educated: Patient Education method: Explanation, Demonstration, Tactile cues, Verbal cues, and Handouts Education comprehension: verbalized understanding, returned demonstration, verbal cues required, tactile cues required, and needs further education   HOME EXERCISE PROGRAM: Access Code: [facility]   ASSESSMENT:  CLINICAL IMPRESSION:  12/10/21   Pt able to progress ther ex today. Challenged with core and hip strengthening due to weakness. She will benefit from continued/progressive strengthening. HEP updated.   Objective impairments include Abnormal gait, decreased activity tolerance, decreased balance, decreased coordination, decreased endurance, decreased knowledge of use of DME, decreased mobility, difficulty walking, decreased strength, decreased safety awareness, increased muscle spasms, and impaired flexibility. These impairments are limiting patient from cleaning, community activity, driving, laundry, yard work, and shopping. Personal factors including Past/current experiences and 1 comorbidity: recent surgery  are also affecting patient's functional outcome. Patient will benefit from skilled PT to address above impairments and improve overall function.  REHAB POTENTIAL: Good  CLINICAL DECISION MAKING: Stable/uncomplicated  EVALUATION COMPLEXITY: Low   GOALS: Goals reviewed with patient? Yes   SHORT TERM GOALS:  STG Name Target Date Goal status  1 Patient will be independent with initial HEP   12/08/21 INITIAL  2            LONG TERM GOALS:   LTG Name Target Date Goal status  1 Patient will be independent with final HEP  01/19/22  INITIAL       2 Pt to demonstrate improved strength of L ankle and bil hips/knees to be at least 4+/5, to improve stability and ability for gait  01/19/22 INITIAL  3 Pt to demo improved score on DGI by at least 5 points, to improve dynamic stability and fall risk.   01/19/22 INITIAL  4 Pt to demo ability for stair navigation, reciprocal pattern, without  support, to improve ability for home and community navigation.   01/19/22 INITIAL     PLAN: PT FREQUENCY: 1-2x/week  PT DURATION: 8 weeks  PLANNED INTERVENTIONS: Therapeutic exercises, Therapeutic activity, Neuro Muscular re-education, Balance training, Gait training, Patient/Family education, Joint mobilization, Stair training, Visual/preceptual remediation/compensation, DME instructions, Dry Needling, Spinal mobilization, Cryotherapy, Moist heat, Taping, Traction, Ultrasound, Ionotophoresis 4mg /ml Dexamethasone, and Manual therapy  PLAN FOR NEXT SESSION: LE strength, dynamic balance, stairs.   Lyndee Hensen, PT, DPT 10:54 AM  12/10/21

## 2021-12-11 ENCOUNTER — Encounter: Payer: Self-pay | Admitting: Internal Medicine

## 2021-12-12 ENCOUNTER — Ambulatory Visit
Admission: RE | Admit: 2021-12-12 | Discharge: 2021-12-12 | Disposition: A | Discharge status: Home / Self Care | Payer: Medicare PPO | Source: Ambulatory Visit | Attending: Physician Assistant | Admitting: Physician Assistant

## 2021-12-12 DIAGNOSIS — Z9189 Other specified personal risk factors, not elsewhere classified: Secondary | ICD-10-CM

## 2021-12-12 DIAGNOSIS — Z780 Asymptomatic menopausal state: Secondary | ICD-10-CM | POA: Diagnosis not present

## 2021-12-12 DIAGNOSIS — M810 Age-related osteoporosis without current pathological fracture: Secondary | ICD-10-CM | POA: Diagnosis not present

## 2021-12-16 ENCOUNTER — Encounter: Payer: Medicare PPO | Admitting: Physical Therapy

## 2021-12-16 ENCOUNTER — Ambulatory Visit: Payer: Medicare PPO | Admitting: Internal Medicine

## 2021-12-18 DIAGNOSIS — Z79899 Other long term (current) drug therapy: Secondary | ICD-10-CM | POA: Diagnosis not present

## 2021-12-18 DIAGNOSIS — Z20822 Contact with and (suspected) exposure to covid-19: Secondary | ICD-10-CM | POA: Diagnosis not present

## 2021-12-18 DIAGNOSIS — D7581 Myelofibrosis: Secondary | ICD-10-CM | POA: Diagnosis not present

## 2021-12-18 DIAGNOSIS — D75839 Thrombocytosis, unspecified: Secondary | ICD-10-CM | POA: Diagnosis not present

## 2021-12-18 DIAGNOSIS — J069 Acute upper respiratory infection, unspecified: Secondary | ICD-10-CM | POA: Diagnosis not present

## 2021-12-18 DIAGNOSIS — B9789 Other viral agents as the cause of diseases classified elsewhere: Secondary | ICD-10-CM | POA: Diagnosis not present

## 2021-12-18 DIAGNOSIS — D471 Chronic myeloproliferative disease: Secondary | ICD-10-CM | POA: Diagnosis not present

## 2021-12-18 DIAGNOSIS — R7989 Other specified abnormal findings of blood chemistry: Secondary | ICD-10-CM | POA: Diagnosis not present

## 2021-12-19 ENCOUNTER — Ambulatory Visit: Payer: Medicare PPO | Admitting: Physician Assistant

## 2021-12-19 ENCOUNTER — Encounter: Payer: Self-pay | Admitting: Physician Assistant

## 2021-12-19 VITALS — BP 127/75 | HR 82 | Temp 98.6°F | Ht 66.0 in | Wt 176.2 lb

## 2021-12-19 DIAGNOSIS — F5101 Primary insomnia: Secondary | ICD-10-CM

## 2021-12-19 DIAGNOSIS — L821 Other seborrheic keratosis: Secondary | ICD-10-CM

## 2021-12-19 MED ORDER — CLONAZEPAM 0.5 MG PO TABS: ORAL_TABLET | ORAL | 0 refills | Status: DC

## 2021-12-19 NOTE — Progress Notes (Signed)
Subjective:    Patient ID: Courtney Buckley, female    DOB: 1951/03/26, 71 y.o.   MRN: [id number]  No chief complaint on file.   HPI Patient is in today for originally to discuss acute coughing, but states this was addressed yesterday with Dr. Joan Mayans and her COVID-19 test was negative. Rx for cough syrup has been helping her.  She is desperately wanting something to help with sleep. Up almost every hour last night. Night before then she was sleeping for only 4 hours. She says she hasn't had a sleep study yet. Denies RLS. Says she stays up with her mind thinking too much and feels like she can't "shut her brain off."  Also requesting referral to dermatology.  Past Medical History:  Diagnosis Date   Aneurysm, splenic artery ([facility])    Cancer (H[facility])    Bone Marrow Cancer   Cirrhosis (HCC)    Colon polyps    Depression    Esophageal varices (HCC)    Gastric ulcer    Gastritis    Internal hemorrhoids    Migraine    Portal hypertensive gastropathy (HCC)    PVT (portal vein thrombosis)     Past Surgical History:  Procedure Laterality Date   BIOPSY  09/30/2021   Procedure: BIOPSY;  Surgeon: Lavena Bullion, DO;  Location: [facility];  Service: Gastroenterology;;   ESOPHAGOGASTRODUODENOSCOPY (EGD) WITH PROPOFOL N/A 09/30/2021   Procedure: ESOPHAGOGASTRODUODENOSCOPY (EGD) WITH PROPOFOL;  Surgeon: Lavena Bullion, DO;  Location: [place];  Service: Gastroenterology;  Laterality: N/A;   FLEXIBLE SIGMOIDOSCOPY N/A 09/30/2021   Procedure: FLEXIBLE SIGMOIDOSCOPY;  Surgeon: Lavena Bullion, DO;  Location: [place];  Service: Gastroenterology;  Laterality: N/A;   IR ANGIOGRAM SELECTIVE EACH ADDITIONAL VESSEL  09/18/2021   IR ANGIOGRAM VISCERAL SELECTIVE  09/18/2021   IR EMBO ARTERIAL NOT HEMORR HEMANG INC GUIDE ROADMAPPING  09/18/2021   IR RADIOLOGIST EVAL & MGMT  09/09/2021   IR US GUIDE VASC ACCESS RIGHT  09/18/2021   LIVER BIOPSY     RADIOLOGY WITH ANESTHESIA N/A  09/18/2021   Procedure: IR WITH ANESTHESIA SPLENIC ARTERY EMBOLIZATION;  Surgeon: Criselda Peaches, MD;  Location: [place];  Service: Radiology;  Laterality: N/A;    Family History  Problem Relation Age of Onset   Cancer - Lung Father    Diabetes Mellitus II Maternal Aunt    Stomach cancer Maternal Aunt    Diabetes Mellitus II Maternal Uncle    Colon cancer Neg Hx    Rectal cancer Neg Hx    Esophageal cancer Neg Hx    Liver disease Neg Hx    Pancreatic cancer Neg Hx     Social History   Tobacco Use   Smoking status: Former    Types: Cigarettes   Smokeless tobacco: Never  Vaping Use   Vaping Use: Never used  Substance Use Topics   Alcohol use: Not Currently    Comment: Occasionally-1 acoholic beverage every 1-2 weeks   Drug use: No     No Known Allergies  Review of Systems NEGATIVE UNLESS OTHERWISE INDICATED IN HPI      Objective:     BP 127/75    Pulse 82    Temp 98.6 F (37 C)    Ht 5\' 6"  (1.676 m)    Wt 176 lb 4 oz (79.9 kg)    SpO2 94%    BMI 28.45 kg/m   Wt Readings from Last 3 Encounters:  12/19/21 176 lb 4 oz (79.9  kg)  12/04/21 165 lb (74.8 kg)  11/24/21 174 lb 3.2 oz (79 kg)    BP Readings from Last 3 Encounters:  12/19/21 127/75  12/04/21 116/61  11/24/21 (!) 98/50     Physical Exam Vitals and nursing note reviewed.  Constitutional:      Appearance: Normal appearance. She is normal weight. She is not toxic-appearing.  HENT:     Head: Normocephalic and atraumatic.     Right Ear: External ear normal.     Left Ear: External ear normal.     Nose: Nose normal.     Mouth/Throat:     Mouth: Mucous membranes are moist.  Eyes:     Extraocular Movements: Extraocular movements intact.     Conjunctiva/sclera: Conjunctivae normal.     Pupils: Pupils are equal, round, and reactive to light.  Cardiovascular:     Rate and Rhythm: Normal rate and regular rhythm.     Pulses: Normal pulses.     Heart sounds: Normal heart sounds.  Pulmonary:      Effort: Pulmonary effort is normal.     Breath sounds: Normal breath sounds.  Musculoskeletal:        General: Normal range of motion.     Cervical back: Normal range of motion and neck supple.  Skin:    General: Skin is warm and dry.     Findings: Lesion (stuck on plaque like lesion left side of face) present.  Neurological:     General: No focal deficit present.     Mental Status: She is alert and oriented to person, place, and time.  Psychiatric:        Mood and Affect: Mood normal.        Behavior: Behavior normal.        Thought Content: Thought content normal.        Judgment: Judgment normal.       Assessment & Plan:   Problem List Items Addressed This Visit   None Visit Diagnoses     Primary insomnia    -  Primary   Seborrheic keratosis       Relevant Orders   Ambulatory referral to Dermatology        Meds ordered this encounter  Medications   clonazePAM (KLONOPIN) 0.5 MG tablet    Sig: Take one to two tabs po qhs prn for anxiety and sleep.    Dispense:  30 tablet    Refill:  0    1. Primary insomnia -Failed Unisom, Z-quil, Melatonin, Ambien, Trazodone -Seems to be 2/2 anxiety; agreed to trial Klonopin 0.5 mg 1-2 tab po prn for sleep -Pt aware of risks vs benefits and possible adverse reactions. -She is to send an update through MyChart for me next week  2. Seborrheic keratosis -Lesion on face appears to be benign seborrheic keratosis, referral to derm for removal    Moroni Nester M Labrina Lines, PA-C

## 2021-12-23 ENCOUNTER — Encounter: Payer: Self-pay | Admitting: Physician Assistant

## 2021-12-23 ENCOUNTER — Encounter: Payer: Medicare PPO | Admitting: Physical Therapy

## 2021-12-30 ENCOUNTER — Telehealth: Payer: Self-pay | Admitting: Dermatology

## 2021-12-30 NOTE — Telephone Encounter (Signed)
Please mark this as a referral from [facility]

## 2021-12-30 NOTE — Telephone Encounter (Signed)
Referral attached to apptointment.

## 2022-01-25 ENCOUNTER — Other Ambulatory Visit: Payer: Self-pay | Admitting: Internal Medicine

## 2022-01-25 DIAGNOSIS — K259 Gastric ulcer, unspecified as acute or chronic, without hemorrhage or perforation: Secondary | ICD-10-CM

## 2022-01-27 ENCOUNTER — Telehealth: Payer: Self-pay | Admitting: Physician Assistant

## 2022-01-27 NOTE — Telephone Encounter (Signed)
.. ?  Encourage patient to contact the pharmacy for refills or they can request refills through [facility] ? ?LAST APPOINTMENT DATE:  12/19/21 ? ?NEXT APPOINTMENT DATE: 05/25/22 ? ?MEDICATION:clonazePAM (KLONOPIN) 0.5 MG tablet ? ?Is the patient out of medication? No ? ?[id number]  [phone] ?Fax:  [phone] ?  ? ? ?Let patient know to contact pharmacy at the end of the day to make sure medication is ready. ? ?Please notify patient to allow 48-72 hours to process  ?

## 2022-01-28 ENCOUNTER — Other Ambulatory Visit: Payer: Self-pay | Admitting: Physician Assistant

## 2022-01-28 MED ORDER — CLONAZEPAM 0.5 MG PO TABS: ORAL_TABLET | ORAL | 2 refills | Status: DC

## 2022-02-05 DIAGNOSIS — D7581 Myelofibrosis: Secondary | ICD-10-CM | POA: Diagnosis not present

## 2022-02-05 DIAGNOSIS — D471 Chronic myeloproliferative disease: Secondary | ICD-10-CM | POA: Diagnosis not present

## 2022-02-05 DIAGNOSIS — Z79899 Other long term (current) drug therapy: Secondary | ICD-10-CM | POA: Diagnosis not present

## 2022-02-05 DIAGNOSIS — D75839 Thrombocytosis, unspecified: Secondary | ICD-10-CM | POA: Diagnosis not present

## 2022-02-12 ENCOUNTER — Telehealth: Payer: Self-pay | Admitting: Physician Assistant

## 2022-02-12 NOTE — Telephone Encounter (Signed)
Patient walked in asking for a refill for her sleeping pill- she thinks its clonazepam.- She will be going out of town next month and in June and is afraid she will not have enough.  ? ?Her last DOS with alyssa was on 12/19/21 and she has a 6 month fu sch for 05/25/22 of which she confirmed.  ? ?[facility].  ?

## 2022-02-12 NOTE — Telephone Encounter (Signed)
Message sent thru MyChart 

## 2022-02-14 IMAGING — MR MR ABDOMEN WO/W CM MRCP
9 of 19 series · 22 of 48 positions shown · IV contrast (8ml eovist)
Comparison: 12/31/2018

CLINICAL DATA: Cirrhosis with ascites.

EXAM:
MRI ABDOMEN WITHOUT AND WITH CONTRAST (INCLUDING MRCP)
TECHNIQUE: Multiplanar multisequence MR imaging of the abdomen was performed
both before and after the administration of intravenous contrast.
Heavily T2-weighted images of the biliary and pancreatic ducts were
obtained, and three-dimensional MRCP images were rendered by post
processing.
CONTRAST:  8mL EOVIST GADOXETATE DISODIUM 0.25 MOL/L IV SOLN

[Series 3: cor haste · coronal · 5.0mm · 0.82mm/px · 2 of 39 slices shown]
[im 1/39]
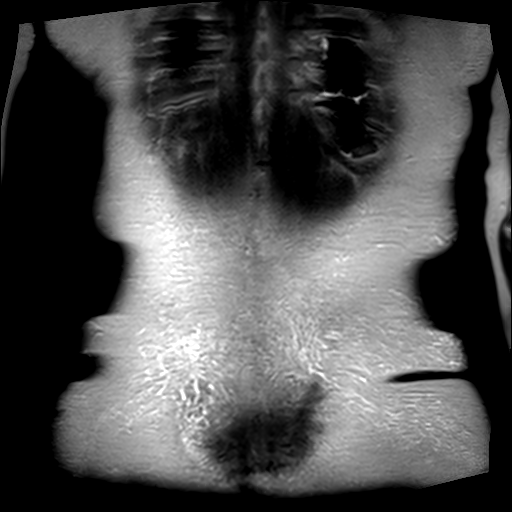
[im 39/39]
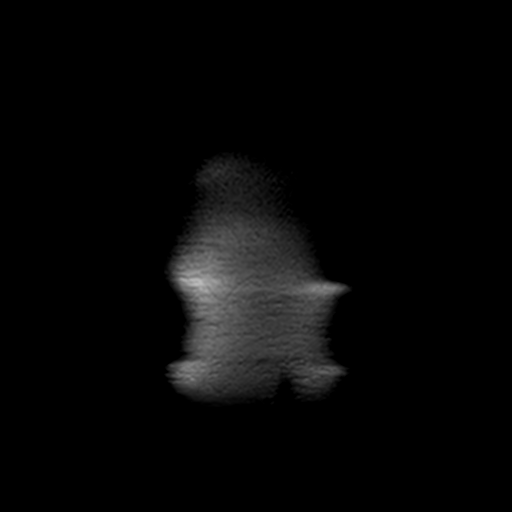

[Series 4: axial haste · axial · 5.0mm · 0.80mm/px · 1 of 44 slices shown]
[im 1/44]
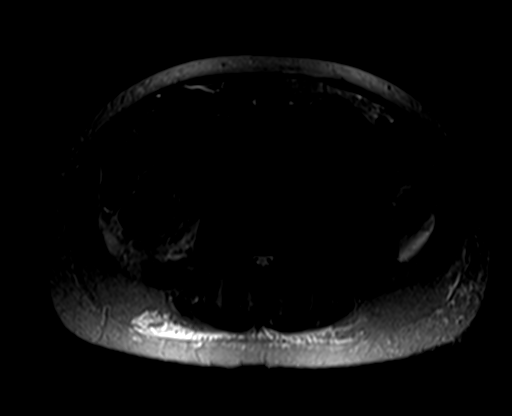

[Series 5: bSSFP · axial · 5.0mm · 0.80mm/px · z∈[-74,+171]mm · 2 of 50 slices shown]
[im 1/50]
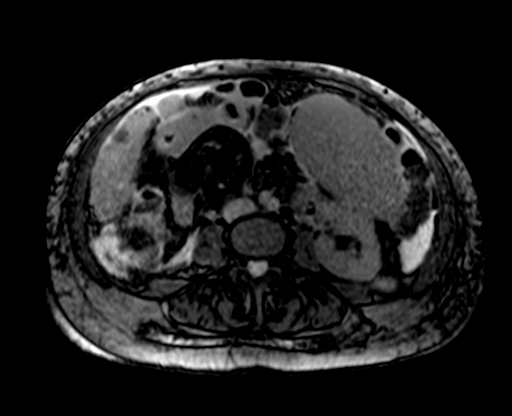
[im 50/50]
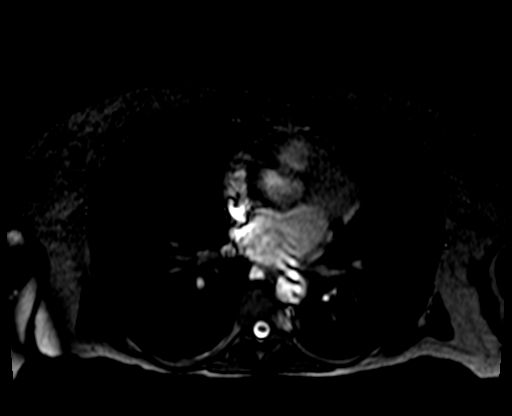

[Series 6: T1 · axial · 5.0mm · 0.80mm/px · z∈[-89,+169]mm · 3 of 88 slices shown]
[im 1/88]
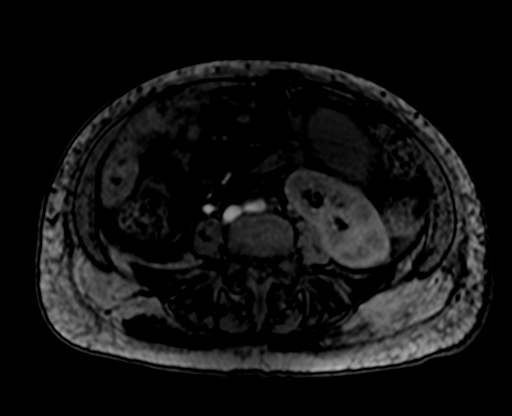
[im 44/88]
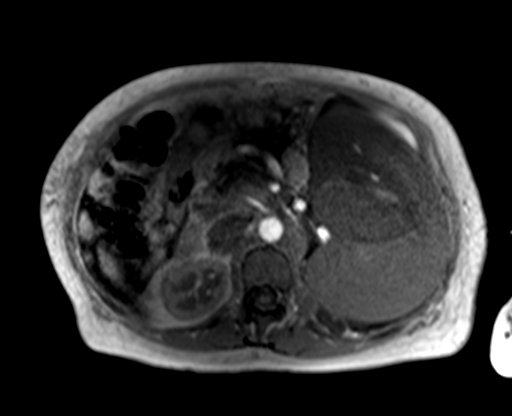
[im 88/88]
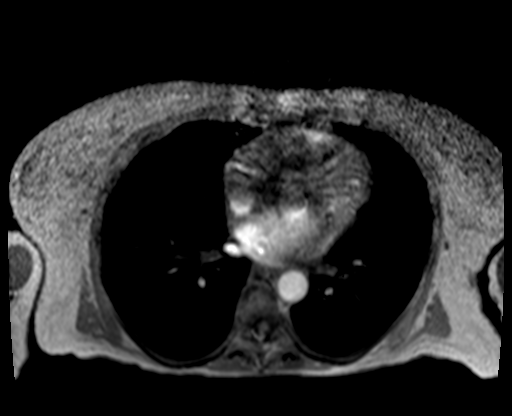

[Series 7: T2 · coronal · 3.0mm · 0.70mm/px · 2 of 48 slices shown]
[im 1/48]
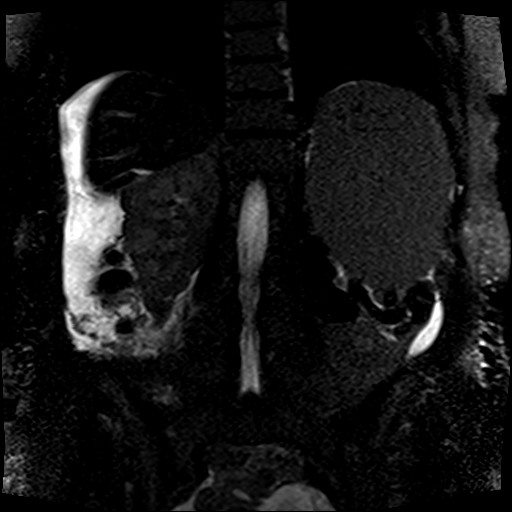
[im 48/48]
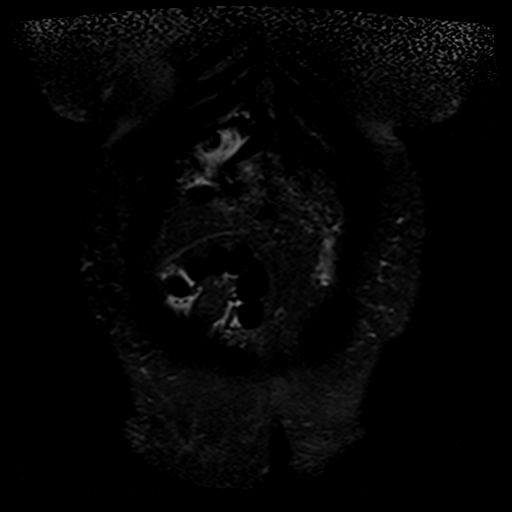

[Series 12: T1 dynamic · axial · non-contrast · 2.7mm · 0.80mm/px · z∈[-86,+171]mm · 3 of 96 slices shown]
[im 1/96]
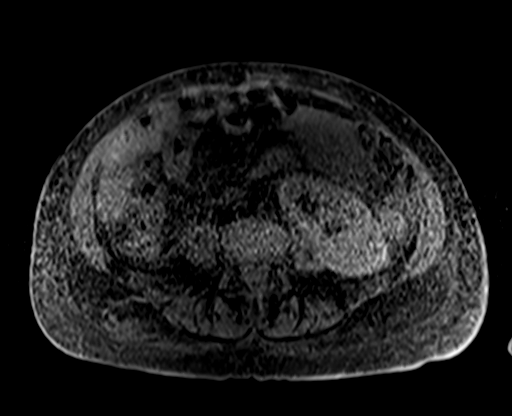
[im 48/96]
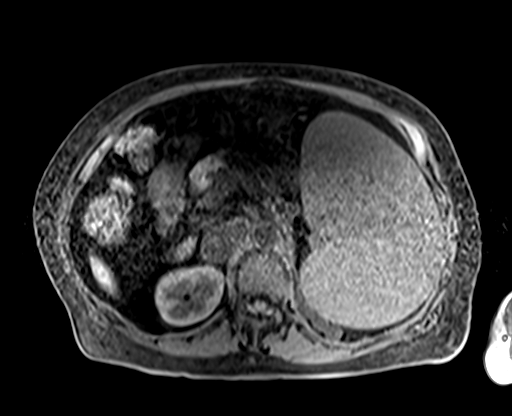
[im 96/96]
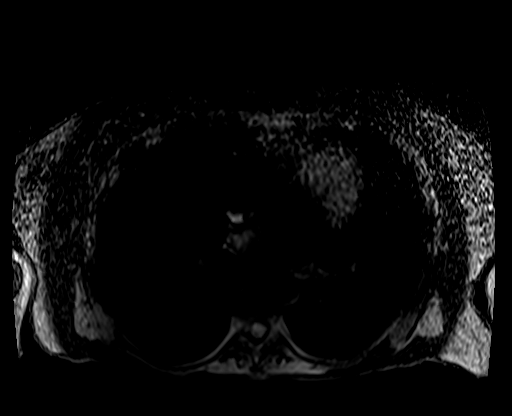

[Series 13: T1 dynamic post-contrast · axial · 2.7mm · 0.80mm/px · z∈[-86,+171]mm · 3 of 96 slices shown (1 of 3)]
[im 1/96]
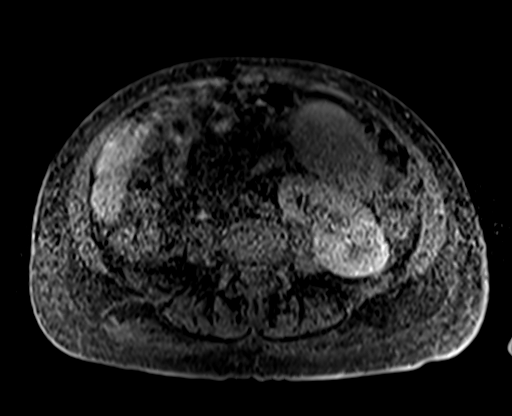
[im 48/96]
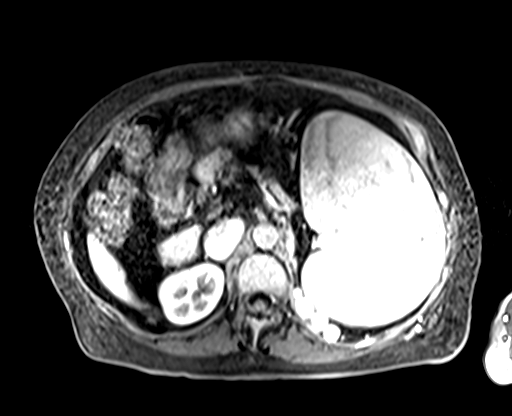
[im 96/96]
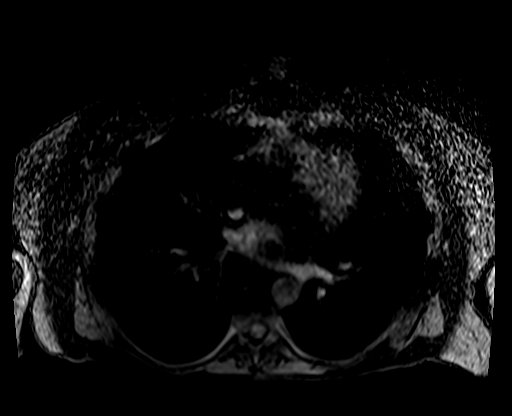

[Series 14: T1 dynamic post-contrast · axial · 2.7mm · 0.80mm/px · z∈[-86,+171]mm · 3 of 96 slices shown (2 of 3)]
[im 1/96]
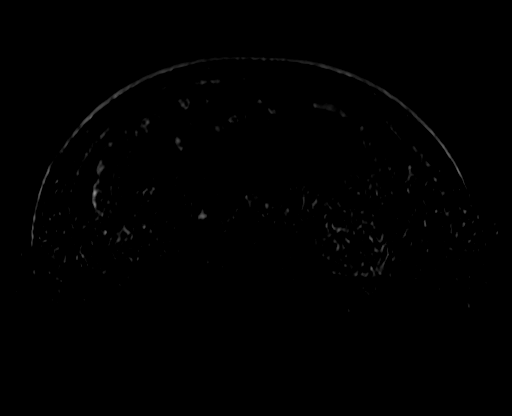
[im 48/96]
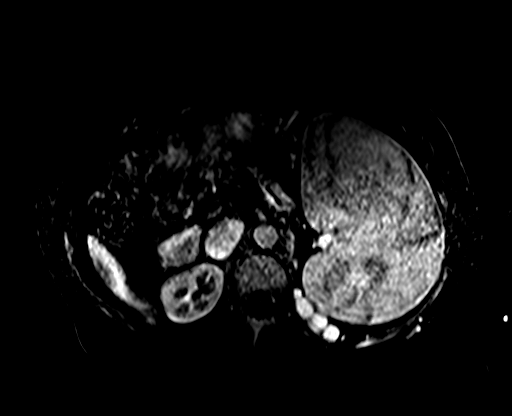
[im 96/96]
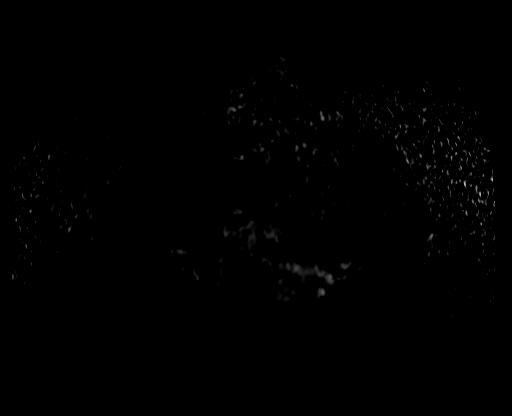

[Series 15: T1 dynamic post-contrast · axial · 2.7mm · 0.80mm/px · z∈[-86,+171]mm · 3 of 96 slices shown (3 of 3)]
[im 1/96]
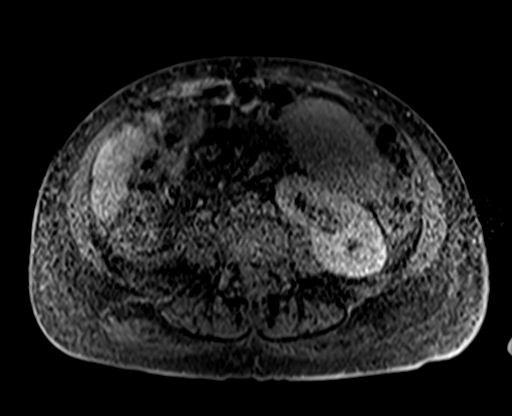
[im 48/96]
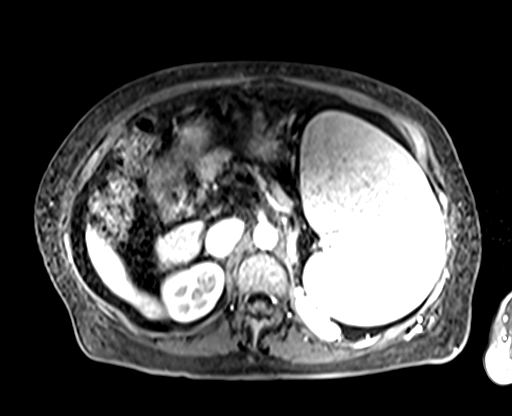
[im 96/96]
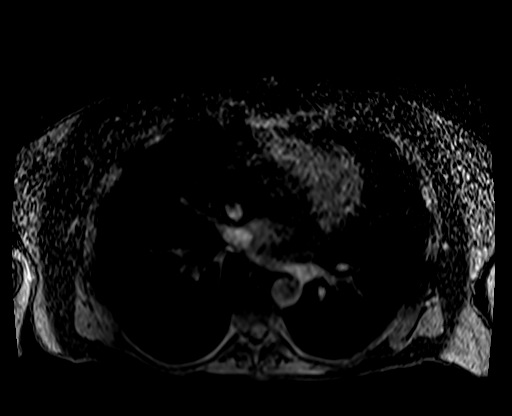

[22 of 48 positions shown; findings below may reference images not displayed]

FINDINGS: Lower chest: Unremarkable.

Hepatobiliary: No arterial phase hyperenhancement in the liver. The
11 mm cavernous hemangioma in the posterior right liver is stable.
Gallbladder is decompressed with probable wall thickening. Wall
thickening likely related to underlying hepatic disease. No
intrahepatic or extrahepatic biliary dilation.

Pancreas: No focal mass lesion. No dilatation of the main duct. No
intraparenchymal cyst. No peripancreatic edema.

Spleen:  Stable splenomegaly.

Adrenals/Urinary Tract: Right adrenal gland unremarkable. Left
adrenal gland not well seen. Right kidney unremarkable. Left kidney
is displaced inferiorly due to splenomegaly and has been
incompletely characterized on today's study.

Stomach/Bowel: Stomach is decompressed. Duodenum is normally
positioned as is the ligament of Treitz. No small bowel or colonic
dilatation within the visualized abdomen.

Vascular/Lymphatic: No abdominal aortic aneurysm. Chronic occlusion
of the portal vein again noted with extensive venous
collateralization in the left abdomen.

Other:  Moderate volume ascites with mesenteric edema.

Musculoskeletal: No focal suspicious marrow enhancement within the
visualized bony anatomy.
IMPRESSION: 1. Stable 11 mm cavernous hemangioma in the posterior right liver.
No arterial phase hyperenhancement in the liver to suggest
hepatocellular carcinoma.
2. Chronic portal vein occlusion with stable splenomegaly and
extensive venous collateralization in the left abdomen. Imaging
features consistent with portal venous hypertension in this patient
with clinical history of cirrhosis.
3. Moderate volume ascites with mesenteric edema.

## 2022-04-02 DIAGNOSIS — I8510 Secondary esophageal varices without bleeding: Secondary | ICD-10-CM | POA: Diagnosis not present

## 2022-04-02 DIAGNOSIS — K7460 Unspecified cirrhosis of liver: Secondary | ICD-10-CM | POA: Diagnosis not present

## 2022-04-02 DIAGNOSIS — K7469 Other cirrhosis of liver: Secondary | ICD-10-CM | POA: Diagnosis not present

## 2022-04-02 DIAGNOSIS — F068 Other specified mental disorders due to known physiological condition: Secondary | ICD-10-CM | POA: Diagnosis not present

## 2022-04-02 DIAGNOSIS — D75839 Thrombocytosis, unspecified: Secondary | ICD-10-CM | POA: Diagnosis not present

## 2022-04-02 DIAGNOSIS — D7581 Myelofibrosis: Secondary | ICD-10-CM | POA: Diagnosis not present

## 2022-04-02 DIAGNOSIS — Z8711 Personal history of peptic ulcer disease: Secondary | ICD-10-CM | POA: Diagnosis not present

## 2022-04-04 DIAGNOSIS — K7460 Unspecified cirrhosis of liver: Secondary | ICD-10-CM

## 2022-04-04 HISTORY — DX: Unspecified cirrhosis of liver: K74.60

## 2022-04-07 ENCOUNTER — Other Ambulatory Visit: Payer: Self-pay

## 2022-04-07 ENCOUNTER — Telehealth: Payer: Self-pay | Admitting: Internal Medicine

## 2022-04-07 MED ORDER — RIFAXIMIN 550 MG PO TABS: 550.0000 mg | ORAL_TABLET | Freq: Two times a day (BID) | ORAL | 3 refills | Status: DC

## 2022-04-07 NOTE — Telephone Encounter (Signed)
Patient was here today because her husband was having a colonoscopy with me.  He reported that she had had some confusion of late and primary care was concerned for possible hepatic encephalopathy, low-grade. He checked an ammonia level which I can see by care everywhere.  Ammonia was elevated at 95  He recommended 3 days of lactulose but the patient has not started this because she is concerned about diarrhea  I recommend that we start rifaximin in place of lactulose. Please begin rifaximin 550 mg twice daily Please schedule office visit with me or APP  I advised that if confusion is moderate to severe or there is sleepiness, fever, chills, that they need to seek care in the emergency department.  They voiced understanding.  Clinically she appeared well today.  Was able to answer questions appropriately without delay.  Complete exam not performed as she was the care partner today for her husband's colonoscopy.

## 2022-04-07 NOTE — Telephone Encounter (Signed)
Script sent to pharmacy. Pt scheduled to see Alonza Bogus PA 04/16/22 at 1:30pm. Pt aware.

## 2022-04-16 ENCOUNTER — Encounter: Payer: Self-pay | Admitting: Gastroenterology

## 2022-04-16 ENCOUNTER — Ambulatory Visit (INDEPENDENT_AMBULATORY_CARE_PROVIDER_SITE_OTHER): Payer: Medicare PPO | Admitting: Gastroenterology

## 2022-04-16 VITALS — BP 110/60 | HR 57 | Ht 66.0 in | Wt 184.0 lb

## 2022-04-16 DIAGNOSIS — I728 Aneurysm of other specified arteries: Secondary | ICD-10-CM | POA: Diagnosis not present

## 2022-04-16 DIAGNOSIS — K7460 Unspecified cirrhosis of liver: Secondary | ICD-10-CM

## 2022-04-16 NOTE — Progress Notes (Signed)
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  She is here alone today.  She underwent splenic artery aneurysm embolization on 11/17 and then was admitted to the hospital from 09/28/2021 to 10/02/2021.  That was a complicated hospitalization, see discharge summary for details.  She required blood transfusion.  Underwent upper endoscopy and flexible sigmoidoscopy by Gerrit Heck on 09/30/2021.  EGD revealed cratered gastric ulcer in the prepyloric stomach.  Biopsies were obtained.  There was also a linear gastric ulcer in the gastric body.  The duodenum was normal.  She was started on twice daily pantoprazole and sucralfate which she has continued.  Flexible sigmoidoscopy was performed for generalized abdominal pain and a CT angio which suggested left-sided colitis.  This was largely unremarkable though somewhat limited by prep.  She was eventually discharged from the hospital on new medications including lactulose and rifaximin.  She was having significant diarrhea and has stopped these medications.  At her follow-up appointment with Dr. Hilarie Fredrickson on 10/21/2021 she was feeling much better.  Was not on Xifaxan or lactulose at that time but  was on pantoprazole 40 mg twice daily.  He wanted her to have repeat EGD in February, results as follows:   EGD 12/2021:  - Normal esophagus. - Nodular mucosa in the prepyloric region of the stomach at site of now healed ulcer. Biopsied. - Normal examined duodenum.  Surgical [P], gastric  nodule - REACTIVE GASTROPATHY WITH FOCAL MILD FOVEOLAR HYPERPLASIA. - NEGATIVE FOR AN INFLAMMATORY INFILTRATE PREDICTIVE OF HELICOBACTER PYLORI INFECTION. - NEGATIVE FOR INTESTINAL METAPLASIA. - NEGATIVE FOR MALIGNANCY.  He had advised that she could decrease the pantoprazole to once daily, but looks like she still remains on twice daily dosing.  He had also wanted to have CT scan for follow-up of the splenic aneurysm, but that has not yet been done although the order was placed back in February.  Her husband had spoken with Dr. Hilarie Fredrickson earlier this month expressing that he and family thought she was having some confusion.  PCPs office checked an ammonia level was elevated at 95.  She was adamant that she did not want to take the lactulose because of the terrible time that she had with the diarrhea when she was on it previously around the time of her hospitalization.  She was started on Xifaxan 550 mg twice daily.  She says that starting that she feels much better.  Overall she feels great.  She has other basic labs performed by her hematologist/oncologist regularly.  Look like blood counts and LFTs have been stable.  Past Medical History:  Diagnosis Date   Aneurysm, splenic artery (HCC)    Cancer (HCC)    Bone Marrow Cancer   Cirrhosis (HCC)    Colon polyps    Depression    Esophageal varices (HCC)    Gastric ulcer    Gastritis    Internal hemorrhoids    Migraine    Portal hypertensive gastropathy (HCC)    PVT (portal vein thrombosis)    Past Surgical History:  Procedure Laterality Date   BIOPSY  09/30/2021   Procedure:  BIOPSY;  Surgeon: Lavena Bullion, DO;  Location: [facility];  Service: Gastroenterology;;   ESOPHAGOGASTRODUODENOSCOPY (EGD) WITH PROPOFOL N/A 09/30/2021   Procedure: ESOPHAGOGASTRODUODENOSCOPY (EGD) WITH PROPOFOL;  Surgeon: Lavena Bullion, DO;  Location: [facility];  Service: Gastroenterology;  Laterality: N/A;   FLEXIBLE SIGMOIDOSCOPY N/A 09/30/2021    Procedure: FLEXIBLE SIGMOIDOSCOPY;  Surgeon: Lavena Bullion, DO;  Location: [facility];  Service: Gastroenterology;  Laterality: N/A;   IR ANGIOGRAM SELECTIVE EACH ADDITIONAL VESSEL  09/18/2021   IR ANGIOGRAM VISCERAL SELECTIVE  09/18/2021   IR EMBO ARTERIAL NOT HEMORR HEMANG INC GUIDE ROADMAPPING  09/18/2021   IR RADIOLOGIST EVAL & MGMT  09/09/2021   IR US GUIDE VASC ACCESS RIGHT  09/18/2021   LIVER BIOPSY     RADIOLOGY WITH ANESTHESIA N/A 09/18/2021   Procedure: IR WITH ANESTHESIA SPLENIC ARTERY EMBOLIZATION;  Surgeon: Criselda Peaches, MD;  Location: [facility];  Service: Radiology;  Laterality: N/A;    reports that she has quit smoking. Her smoking use included cigarettes. She has never used smokeless tobacco. She reports that she does not currently use alcohol. She reports that she does not use drugs. family history includes Cancer - Lung in her father; Diabetes Mellitus II in her maternal aunt and maternal uncle; Stomach cancer in her maternal aunt. No Known Allergies    Outpatient Encounter Medications as of 04/16/2022  Medication Sig   clonazePAM (KLONOPIN) 0.5 MG tablet Take one to two tabs po qhs prn for anxiety and sleep.   JAKAFI 10 MG tablet Take 10 mg by mouth 2 (two) times daily.   Multiple Vitamin (MULTI VITAMIN PO) Take 1 tablet by mouth daily.   pantoprazole (PROTONIX) 40 MG tablet TAKE 1 TABLET BY MOUTH TWICE A DAY   rifaximin (XIFAXAN) 550 MG TABS tablet Take 1 tablet (550 mg total) by mouth 2 (two) times daily.   ondansetron (ZOFRAN) 4 MG tablet Take 1 tablet (4 mg total) by mouth every 6 (six) hours as needed for nausea or vomiting. (Patient not taking: Reported on 04/16/2022)   No facility-administered encounter medications on file as of 04/16/2022.    REVIEW OF SYSTEMS  : All other systems reviewed and negative except where noted in the History of Present Illness.   PHYSICAL EXAM: BP 110/60   Pulse (!) 57   Ht '5\' 6"'$  (1.676 m)   Wt 184 lb (83.5 kg)   BMI  29.70 kg/m  General: Well developed white female in no acute distress Head: Normocephalic and atraumatic Eyes:  Sclerae anicteric, conjunctiva pink. Ears: Normal auditory acuity Lungs: Clear throughout to auscultation; no W/R/R. Heart: Regular rate and rhythm; no M/R/G. Abdomen: Soft, non-distended.  BS present.  Non-tender. Musculoskeletal: Symmetrical with no gross deformities  Skin: No lesions on visible extremities Extremities: Minimal LE edema, more so on left foot/ankle area.  Neurological: Alert oriented x 4, grossly non-focal; no asterixis on exam, conversing well Psychological:  Alert and cooperative. 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     Splenic artery aneurysm status post coil embolization in 09/2021--her splenic artery aneurysm has been coiled though she did suffer splenic artery infarction and was hospitalized several weeks later.  We  discussed that the splenic artery aneurysm could have been causing her abdominal symptoms and the ulcer could have resulted from ischemia related to the embolization.  However it is possible that the ulcer was causing the symptoms in the first place.  Either way the aneurysm was high risk and needed repair which has been done.  Repeat EGD in 12/2021 showed that her ulcer had healed. --Was supposed to have repeat abdominal imaging in February to follow-up after splenic artery embolization as well as recent splenic infarction and that order was placed but it was never scheduled or performed.  Will plan for that now.   2.  Gastric ulcer --history  of gastric ulcer, prior biopsies negative for H. pylori.  EGD in February 2023 showed that ulcer had healed. --Continue pantoprazole 40 mg twice daily.  Can decrease to once daily per Dr. Vena Rua EGD recommendations. --Avoid NSAIDs --She can use Zofran 4 to 8 mg every 8 hours as needed for nausea   3.  Descending colitis --likely artifact or possibly mild ischemia related to splenic artery coil embolization and splenic infarction.  She had colonoscopy without colitis in 2020.  Nothing for needed for this at present.   4.  Cirrhosis with history of portal hypertension/chronic portal venous thrombosis --she has history of portal hypertension including esophageal varices though there were no varices seen at recent EGD.  It is possible that after splenic artery embolization portal hypertension improved in the upper abdomen.  She went home from the hospital on lactulose and rifaximin but had a terrible time with the lactulose.  She was critically ill when she presented to the hospital and this likely clouded the picture regarding her mental status.  We did discuss liver cirrhosis today and I recommended the following: --Hepatic encephalopathy --She had stopped the lactulose and rifaximin as they were not convinced that her mental status change during her hospitalization was HE, however, earlier this month her husband made Dr. Hilarie Fredrickson aware that she seemed to have some confusion and her ammonia levels are elevated at 95.  She did not want to do the lactulose because of the awful time that she had with it previously so we started Xifaxan 550 mg twice daily.  She says that there is definite improvement since beginning that just about a week or so ago. --Esophageal varices --previously seen by endoscopy but negative on recent EGD x 2.  Consider surveillance upper endoscopy for variceal screening around February 2025 (2 years after last endoscopy, or sooner should decompensation occur and liver disease) --She was  previously on nadolol but this has been stopped --No evidence for ascites today; mild lower extremity edema which can be followed.  She is not currently on diuretics and I do not think she needs to be at this time --[facility] screening with cross-sectional imaging that we are scheduling for splenic aneurysm follow-up as well.  AFP 09/2021 was 3.3.   5.  MDS --she is following with her hematologist oncologist and will has labs done regularly.  **We will follow-up with Dr. Hilarie Fredrickson in 6 months or sooner if needed.  She was advised that if her confusion became worse than we would need to be notified and ED visit may be necessary and lactulose may need to be added.   CC:  Allwardt, Randa Evens, PA-C

## 2022-04-16 NOTE — Patient Instructions (Addendum)
You have been scheduled for a CT scan of the abdomen and pelvis at [facility] ([address]---this is in the same building as [employer]).   You are scheduled on Tuesday 04/28/22 at 1 pm. You should arrive 15 minutes prior to your appointment time for registration. Please follow the written instructions below on the day of your exam:  WARNING: IF YOU ARE ALLERGIC TO IODINE/X-RAY DYE, PLEASE NOTIFY RADIOLOGY IMMEDIATELY AT [phone]! YOU WILL BE GIVEN A 13 HOUR PREMEDICATION PREP.  1) Do not eat anything after 9 am (4 hours prior to your test) 2) You have been given 2 bottles of oral contrast to drink. The solution may taste better if refrigerated, but do NOT add ice or any other liquid to this solution. Shake well before drinking.    Drink 1 bottle of contrast @ 11 pm (2 hours prior to your exam)  Drink 1 bottle of contrast @ 12 pm (1 hour prior to your exam)  You may take any medications as prescribed with a small amount of water, if necessary. If you take any of the following medications: METFORMIN, GLUCOPHAGE, GLUCOVANCE, AVANDAMET, RIOMET, FORTAMET, [place] MET, JANUMET, GLUMETZA or METAGLIP, you MAY be asked to HOLD this medication 48 hours AFTER the exam.  The purpose of you drinking the oral contrast is to aid in the visualization of your intestinal tract. The contrast solution may cause some diarrhea. Depending on your individual set of symptoms, you may also receive an intravenous injection of x-ray contrast/dye. Plan on being at [facility] for 30 minutes or longer, depending on the type of exam you are having performed.  This test typically takes 30-45 minutes to complete.  If you have any questions regarding your exam or if you need to reschedule, you may call the CT department at [phone] between the hours of 8:00 am and 5:00 pm, Monday-Friday.  _____________________________________________________________________

## 2022-04-20 ENCOUNTER — Other Ambulatory Visit: Payer: Medicare PPO

## 2022-04-22 ENCOUNTER — Telehealth: Payer: Self-pay

## 2022-04-22 NOTE — Telephone Encounter (Signed)
Noted order has been corrected

## 2022-04-22 NOTE — Telephone Encounter (Signed)
Received call from [place] with [facility] regarding recent CT scan that was ordered. She stated order needed to be changed to CT Angio Abd Pelvis. Will route to [place], since [place] is out this week to make her aware. I've given the okay to [place] for order to be adjusted.

## 2022-04-28 ENCOUNTER — Ambulatory Visit (INDEPENDENT_AMBULATORY_CARE_PROVIDER_SITE_OTHER)
Admission: RE | Admit: 2022-04-28 | Discharge: 2022-04-28 | Disposition: A | Discharge status: Home / Self Care | Payer: Medicare PPO | Source: Ambulatory Visit | Attending: Internal Medicine | Admitting: Internal Medicine

## 2022-04-28 DIAGNOSIS — K7460 Unspecified cirrhosis of liver: Secondary | ICD-10-CM | POA: Diagnosis not present

## 2022-04-28 DIAGNOSIS — D259 Leiomyoma of uterus, unspecified: Secondary | ICD-10-CM | POA: Diagnosis not present

## 2022-04-28 DIAGNOSIS — I728 Aneurysm of other specified arteries: Secondary | ICD-10-CM | POA: Diagnosis not present

## 2022-04-28 MED ORDER — IOHEXOL 350 MG/ML SOLN
100.0000 mL | Freq: Once | INTRAVENOUS | Status: AC | PRN
  Administered 2022-04-28: 100 mL via INTRAVENOUS

## 2022-05-25 ENCOUNTER — Encounter: Payer: Self-pay | Admitting: Physician Assistant

## 2022-05-25 ENCOUNTER — Ambulatory Visit: Payer: Medicare PPO | Admitting: Physician Assistant

## 2022-05-25 VITALS — BP 110/62 | HR 65 | Temp 98.4°F | Ht 66.0 in | Wt 187.4 lb

## 2022-05-25 DIAGNOSIS — F5101 Primary insomnia: Secondary | ICD-10-CM | POA: Diagnosis not present

## 2022-05-25 DIAGNOSIS — Z23 Encounter for immunization: Secondary | ICD-10-CM

## 2022-05-25 DIAGNOSIS — M1711 Unilateral primary osteoarthritis, right knee: Secondary | ICD-10-CM | POA: Diagnosis not present

## 2022-05-25 NOTE — Progress Notes (Signed)
Subjective:    Patient ID: Courtney Buckley, female    DOB: October 22, 1951, 71 y.o.   MRN: [id number]  Chief Complaint  Patient presents with   Follow-up    Pt come in for 6 mon f/u and med check; pt has cold symptoms, cough and congestion; pt is wanting a referral due to arthritis in right knee and wanting to see Ortho due to being told she was needing a knee replacement in the past; pt is due for pneumonia;     HPI Patient is in today for regular 6 month f/up. She has had regular f/up with GI and hematology/oncology. Labs have been stable.    Requesting referral -  Right knee starting to buckle on her. She was told about 15 years ago she would need replacement eventually. Sometimes significantly affecting her gait.   Insomnia -  CBD and THC for sleep each night, has never slept better she says. Not taking Klonopin anymore. Denies any side effects from these OTC supplements.  Past Medical History:  Diagnosis Date   Aneurysm, splenic artery (HCC)    Cancer (HCC)    Bone Marrow Cancer   Cirrhosis (HCC)    Colon polyps    Depression    Esophageal varices (HCC)    Gastric ulcer    Gastritis    Internal hemorrhoids    Migraine    Portal hypertensive gastropathy (HCC)    PVT (portal vein thrombosis)     Past Surgical History:  Procedure Laterality Date   BIOPSY  09/30/2021   Procedure: BIOPSY;  Surgeon: Lavena Bullion, DO;  Location: [facility];  Service: Gastroenterology;;   ESOPHAGOGASTRODUODENOSCOPY (EGD) WITH PROPOFOL N/A 09/30/2021   Procedure: ESOPHAGOGASTRODUODENOSCOPY (EGD) WITH PROPOFOL;  Surgeon: Lavena Bullion, DO;  Location: [facility];  Service: Gastroenterology;  Laterality: N/A;   FLEXIBLE SIGMOIDOSCOPY N/A 09/30/2021   Procedure: FLEXIBLE SIGMOIDOSCOPY;  Surgeon: Lavena Bullion, DO;  Location: [facility];  Service: Gastroenterology;  Laterality: N/A;   IR ANGIOGRAM SELECTIVE EACH ADDITIONAL VESSEL  09/18/2021   IR ANGIOGRAM VISCERAL SELECTIVE   09/18/2021   IR EMBO ARTERIAL NOT HEMORR HEMANG INC GUIDE ROADMAPPING  09/18/2021   IR RADIOLOGIST EVAL & MGMT  09/09/2021   IR US GUIDE VASC ACCESS RIGHT  09/18/2021   LIVER BIOPSY     RADIOLOGY WITH ANESTHESIA N/A 09/18/2021   Procedure: IR WITH ANESTHESIA SPLENIC ARTERY EMBOLIZATION;  Surgeon: Criselda Peaches, MD;  Location: [facility];  Service: Radiology;  Laterality: N/A;    Family History  Problem Relation Age of Onset   Cancer - Lung Father    Diabetes Mellitus II Maternal Aunt    Stomach cancer Maternal Aunt    Diabetes Mellitus II Maternal Uncle    Colon cancer Neg Hx    Rectal cancer Neg Hx    Esophageal cancer Neg Hx    Liver disease Neg Hx    Pancreatic cancer Neg Hx     Social History   Tobacco Use   Smoking status: Former    Types: Cigarettes   Smokeless tobacco: Never  Vaping Use   Vaping Use: Never used  Substance Use Topics   Alcohol use: Not Currently    Comment: Occasionally-1 acoholic beverage every 1-2 weeks   Drug use: No     No Known Allergies  Review of Systems NEGATIVE UNLESS OTHERWISE INDICATED IN HPI      Objective:     BP 110/62 (BP Location: Right Arm)   Pulse 65  Temp 98.4 F (36.9 C) (Temporal)   Ht '5\' 6"'$  (1.676 m)   Wt 187 lb 6.4 oz (85 kg)   SpO2 97%   BMI 30.25 kg/m   Wt Readings from Last 3 Encounters:  05/25/22 187 lb 6.4 oz (85 kg)  04/16/22 184 lb (83.5 kg)  12/19/21 176 lb 4 oz (79.9 kg)    BP Readings from Last 3 Encounters:  05/25/22 110/62  04/16/22 110/60  12/19/21 127/75     Physical Exam Constitutional:      Appearance: Normal appearance.  HENT:     Mouth/Throat:     Mouth: Mucous membranes are moist.  Eyes:     Conjunctiva/sclera: Conjunctivae normal.     Pupils: Pupils are equal, round, and reactive to light.  Cardiovascular:     Rate and Rhythm: Normal rate and regular rhythm.     Pulses: Normal pulses.     Heart sounds: No murmur heard. Pulmonary:     Effort: Pulmonary effort is normal.  No respiratory distress.     Breath sounds: Normal breath sounds. No wheezing or rales.  Musculoskeletal:     Right knee: Deformity (arthritic changes noted) present. No erythema or ecchymosis.     Right lower leg: No edema.     Left lower leg: No edema.  Neurological:     Mental Status: She is alert.  Psychiatric:        Mood and Affect: Mood normal.        Behavior: Behavior normal.        Assessment & Plan:   Problem List Items Addressed This Visit   None Visit Diagnoses     Primary osteoarthritis of right knee    -  Primary   Relevant Orders   AMB referral to orthopedics   Primary insomnia       Need for prophylactic vaccination against Streptococcus pneumoniae (pneumococcus)       Relevant Orders   Pneumococcal conjugate vaccine 20-valent (Prevnar 20) (Completed)       1. Primary osteoarthritis of right knee Pt states she's ready to see ortho about her knee at this point. Referral to Dr. Wynelle Link sent for patient today.   2. Primary insomnia Resolved No longer taking Klonopin  Doing well with OTC CBD, denies any side effects or issues Pt to call if anything changes  3. Need for prophylactic vaccination against Streptococcus pneumoniae (pneumococcus) Updated Prevnar 20 today    Cont  regular f/up with specialists. Call sooner if concerns.   Return in about 6 months (around 11/25/2022) for regular follow-up .    Raynell Scott M Jamita Mckelvin, PA-C

## 2022-05-25 NOTE — Patient Instructions (Addendum)
Good to see you today!  Referral sent to Dr. Wynelle Link about your R knee Pneumonia vaccine (Prevnar 74) updated today  Call sooner if any concerns

## 2022-05-29 ENCOUNTER — Encounter: Payer: Self-pay | Admitting: Internal Medicine

## 2022-06-08 ENCOUNTER — Ambulatory Visit (INDEPENDENT_AMBULATORY_CARE_PROVIDER_SITE_OTHER): Payer: Medicare PPO

## 2022-06-08 DIAGNOSIS — Z0000 Encounter for general adult medical examination without abnormal findings: Secondary | ICD-10-CM | POA: Diagnosis not present

## 2022-06-08 NOTE — Patient Instructions (Signed)
[name] , Thank you for taking time to come for your Medicare Wellness Visit. I appreciate your ongoing commitment to your health goals. Please review the following plan we discussed and let me know if I can assist you in the future.   Screening recommendations/referrals: Colonoscopy: no longer required  Mammogram: done 08/07/21 repeat every years  Bone Density: done 12/12/21 repeat every 2 years  Recommended yearly ophthalmology/optometry visit for glaucoma screening and checkup Recommended yearly dental visit for hygiene and checkup  Vaccinations: Influenza vaccine: due  Pneumococcal vaccine: Up to date Tdap vaccine: due  Shingles vaccine: completed 08/10/19 & 05/04/20    Covid-19:completed 2/12, 3/9, 07/30/20 & 02/05/21  Advanced directives: Please bring a copy of your health care power of attorney and living will to the office at your convenience.  Conditions/risks identified: lose weight   Next appointment: Follow up in one year for your annual wellness visit    Preventive Care 65 Years and Older, Female Preventive care refers to lifestyle choices and visits with your health care provider that can promote health and wellness. What does preventive care include? A yearly physical exam. This is also called an annual well check. Dental exams once or twice a year. Routine eye exams. Ask your health care provider how often you should have your eyes checked. Personal lifestyle choices, including: Daily care of your teeth and gums. Regular physical activity. Eating a healthy diet. Avoiding tobacco and drug use. Limiting alcohol use. Practicing safe sex. Taking low-dose aspirin every day. Taking vitamin and mineral supplements as recommended by your health care provider. What happens during an annual well check? The services and screenings done by your health care provider during your annual well check will depend on your age, overall health, lifestyle risk factors, and family  history of disease. Counseling  Your health care provider may ask you questions about your: Alcohol use. Tobacco use. Drug use. Emotional well-being. Home and relationship well-being. Sexual activity. Eating habits. History of falls. Memory and ability to understand (cognition). Work and work Statistician. Reproductive health. Screening  You may have the following tests or measurements: Height, weight, and BMI. Blood pressure. Lipid and cholesterol levels. These may be checked every 5 years, or more frequently if you are over 20 years old. Skin check. Lung cancer screening. You may have this screening every year starting at age 2 if you have a 30-pack-year history of smoking and currently smoke or have quit within the past 15 years. Fecal occult blood test (FOBT) of the stool. You may have this test every year starting at age 1. Flexible sigmoidoscopy or colonoscopy. You may have a sigmoidoscopy every 5 years or a colonoscopy every 10 years starting at age 26. Hepatitis C blood test. Hepatitis B blood test. Sexually transmitted disease (STD) testing. Diabetes screening. This is done by checking your blood sugar (glucose) after you have not eaten for a while (fasting). You may have this done every 1-3 years. Bone density scan. This is done to screen for osteoporosis. You may have this done starting at age 64. Mammogram. This may be done every 1-2 years. Talk to your health care provider about how often you should have regular mammograms. Talk with your health care provider about your test results, treatment options, and if necessary, the need for more tests. Vaccines  Your health care provider may recommend certain vaccines, such as: Influenza vaccine. This is recommended every year. Tetanus, diphtheria, and acellular pertussis (Tdap, Td) vaccine. You may need a Td  booster every 10 years. Zoster vaccine. You may need this after age 20. Pneumococcal 13-valent conjugate (PCV13)  vaccine. One dose is recommended after age 75. Pneumococcal polysaccharide (PPSV23) vaccine. One dose is recommended after age 54. Talk to your health care provider about which screenings and vaccines you need and how often you need them. This information is not intended to replace advice given to you by your health care provider. Make sure you discuss any questions you have with your health care provider. Document Released: 11/15/2015 Document Revised: 07/08/2016 Document Reviewed: 08/20/2015 Elsevier Interactive Patient Education  2017 [employer] Prevention in the Home Falls can cause injuries. They can happen to people of all ages. There are many things you can do to make your home safe and to help prevent falls. What can I do on the outside of my home? Regularly fix the edges of walkways and driveways and fix any cracks. Remove anything that might make you trip as you walk through a door, such as a raised step or threshold. Trim any bushes or trees on the path to your home. Use bright outdoor lighting. Clear any walking paths of anything that might make someone trip, such as rocks or tools. Regularly check to see if handrails are loose or broken. Make sure that both sides of any steps have handrails. Any raised decks and porches should have guardrails on the edges. Have any leaves, snow, or ice cleared regularly. Use sand or salt on walking paths during winter. Clean up any spills in your garage right away. This includes oil or grease spills. What can I do in the bathroom? Use night lights. Install grab bars by the toilet and in the tub and shower. Do not use towel bars as grab bars. Use non-skid mats or decals in the tub or shower. If you need to sit down in the shower, use a plastic, non-slip stool. Keep the floor dry. Clean up any water that spills on the floor as soon as it happens. Remove soap buildup in the tub or shower regularly. Attach bath mats securely with  double-sided non-slip rug tape. Do not have throw rugs and other things on the floor that can make you trip. What can I do in the bedroom? Use night lights. Make sure that you have a light by your bed that is easy to reach. Do not use any sheets or blankets that are too big for your bed. They should not hang down onto the floor. Have a firm chair that has side arms. You can use this for support while you get dressed. Do not have throw rugs and other things on the floor that can make you trip. What can I do in the kitchen? Clean up any spills right away. Avoid walking on wet floors. Keep items that you use a lot in easy-to-reach places. If you need to reach something above you, use a strong step stool that has a grab bar. Keep electrical cords out of the way. Do not use floor polish or wax that makes floors slippery. If you must use wax, use non-skid floor wax. Do not have throw rugs and other things on the floor that can make you trip. What can I do with my stairs? Do not leave any items on the stairs. Make sure that there are handrails on both sides of the stairs and use them. Fix handrails that are broken or loose. Make sure that handrails are as long as the stairways. Check any carpeting  to make sure that it is firmly attached to the stairs. Fix any carpet that is loose or worn. Avoid having throw rugs at the top or bottom of the stairs. If you do have throw rugs, attach them to the floor with carpet tape. Make sure that you have a light switch at the top of the stairs and the bottom of the stairs. If you do not have them, ask someone to add them for you. What else can I do to help prevent falls? Wear shoes that: Do not have high heels. Have rubber bottoms. Are comfortable and fit you well. Are closed at the toe. Do not wear sandals. If you use a stepladder: Make sure that it is fully opened. Do not climb a closed stepladder. Make sure that both sides of the stepladder are locked  into place. Ask someone to hold it for you, if possible. Clearly mark and make sure that you can see: Any grab bars or handrails. First and last steps. Where the edge of each step is. Use tools that help you move around (mobility aids) if they are needed. These include: Canes. Walkers. Scooters. Crutches. Turn on the lights when you go into a dark area. Replace any light bulbs as soon as they burn out. Set up your furniture so you have a clear path. Avoid moving your furniture around. If any of your floors are uneven, fix them. If there are any pets around you, be aware of where they are. Review your medicines with your doctor. Some medicines can make you feel dizzy. This can increase your chance of falling. Ask your doctor what other things that you can do to help prevent falls. This information is not intended to replace advice given to you by your health care provider. Make sure you discuss any questions you have with your health care provider. Document Released: 08/15/2009 Document Revised: 03/26/2016 Document Reviewed: 11/23/2014 Elsevier Interactive Patient Education  2017 [employer].

## 2022-06-08 NOTE — Progress Notes (Signed)
Virtual Visit via Telephone Note  I connected with  Courtney Buckley on 06/08/22 at 10:15 AM EDT by telephone and verified that I am speaking with the correct person using two identifiers.  Medicare Annual Wellness visit completed telephonically due to Covid-19 pandemic.   Persons participating in this call: This Health Coach and this patient.   Location: Patient: home Provider: office    I discussed the limitations, risks, security and privacy concerns of performing an evaluation and management service by telephone and the availability of in person appointments. The patient expressed understanding and agreed to proceed.  Unable to perform video visit due to video visit attempted and failed and/or patient does not have video capability.   Some vital signs may be absent or patient reported.   Willette Brace, LPN   Subjective:   Courtney Buckley is a 71 y.o. female who presents for Medicare Annual (Subsequent) preventive examination.  Review of Systems     Cardiac Risk Factors include: advanced age (>53mn, >>63women)     Objective:    There were no vitals filed for this visit. There is no height or weight on file to calculate BMI.     06/08/2022   10:48 AM 11/24/2021    2:03 PM 09/29/2021   10:14 PM 09/18/2021   11:24 AM 08/27/2021   10:42 PM 07/02/2021    2:05 PM 05/26/2021   11:20 AM  Advanced Directives  Does Patient Have a Medical Advance Directive? Yes No Yes Yes No Yes Yes  Type of AOccupational hygienistPower of AHowellLiving will [place] Does patient want to make changes to medical advance directive?   No - Patient declined No - Patient declined     Copy of HNorlinain Chart? No - copy requested  No - copy requested No - copy requested   No - copy requested  Would patient like information on creating a medical advance  directive?  No - Patient declined No - Patient declined  No - Patient declined      Current Medications (verified) Outpatient Encounter Medications as of 06/08/2022  Medication Sig   JAKAFI 10 MG tablet Take 10 mg by mouth 2 (two) times daily.   Multiple Vitamin (MULTI VITAMIN PO) Take 1 tablet by mouth daily.   pantoprazole (PROTONIX) 40 MG tablet TAKE 1 TABLET BY MOUTH TWICE A DAY   VITAMIN D PO Take by mouth.   clonazePAM (KLONOPIN) 0.5 MG tablet Take one to two tabs po qhs prn for anxiety and sleep. (Patient not taking: Reported on 06/08/2022)   [DISCONTINUED] ondansetron (ZOFRAN) 4 MG tablet Take 1 tablet (4 mg total) by mouth every 6 (six) hours as needed for nausea or vomiting.   [DISCONTINUED] rifaximin (XIFAXAN) 550 MG TABS tablet Take 1 tablet (550 mg total) by mouth 2 (two) times daily.   No facility-administered encounter medications on file as of 06/08/2022.    Allergies (verified) Patient has no known allergies.   History: Past Medical History:  Diagnosis Date   Aneurysm, splenic artery (HCC)    Cancer (HCC)    Bone Marrow Cancer   Cirrhosis (HCC)    Colon polyps    Depression    Esophageal varices (HCC)    Gastric ulcer    Gastritis    Internal hemorrhoids    Migraine    Portal hypertensive gastropathy ([place]  PVT (portal vein thrombosis)    Past Surgical History:  Procedure Laterality Date   BIOPSY  09/30/2021   Procedure: BIOPSY;  Surgeon: Lavena Bullion, DO;  Location: [facility];  Service: Gastroenterology;;   ESOPHAGOGASTRODUODENOSCOPY (EGD) WITH PROPOFOL N/A 09/30/2021   Procedure: ESOPHAGOGASTRODUODENOSCOPY (EGD) WITH PROPOFOL;  Surgeon: Lavena Bullion, DO;  Location: [facility];  Service: Gastroenterology;  Laterality: N/A;   FLEXIBLE SIGMOIDOSCOPY N/A 09/30/2021   Procedure: FLEXIBLE SIGMOIDOSCOPY;  Surgeon: Lavena Bullion, DO;  Location: [facility];  Service: Gastroenterology;  Laterality: N/A;   IR ANGIOGRAM SELECTIVE EACH ADDITIONAL  VESSEL  09/18/2021   IR ANGIOGRAM VISCERAL SELECTIVE  09/18/2021   IR EMBO ARTERIAL NOT HEMORR HEMANG INC GUIDE ROADMAPPING  09/18/2021   IR RADIOLOGIST EVAL & MGMT  09/09/2021   IR US GUIDE VASC ACCESS RIGHT  09/18/2021   LIVER BIOPSY     RADIOLOGY WITH ANESTHESIA N/A 09/18/2021   Procedure: IR WITH ANESTHESIA SPLENIC ARTERY EMBOLIZATION;  Surgeon: Criselda Peaches, MD;  Location: [facility];  Service: Radiology;  Laterality: N/A;   Family History  Problem Relation Age of Onset   Cancer - Lung Father    Diabetes Mellitus II Maternal Aunt    Stomach cancer Maternal Aunt    Diabetes Mellitus II Maternal Uncle    Colon cancer Neg Hx    Rectal cancer Neg Hx    Esophageal cancer Neg Hx    Liver disease Neg Hx    Pancreatic cancer Neg Hx    Social History   Socioeconomic History   Marital status: Significant Other    Spouse name: Not on file   Number of children: 2   Years of education: Not on file   Highest education level: Not on file  Occupational History   Not on file  Tobacco Use   Smoking status: Former    Types: Cigarettes   Smokeless tobacco: Never  Vaping Use   Vaping Use: Never used  Substance and Sexual Activity   Alcohol use: Not Currently    Comment: Occasionally-1 acoholic beverage every 1-2 weeks   Drug use: No   Sexual activity: Not on file  Other Topics Concern   Not on file  Social History Narrative   Not on file   Social Determinants of Health   Financial Resource Strain: Low Risk  (06/08/2022)   Overall Financial Resource Strain (CARDIA)    Difficulty of Paying Living Expenses: Not hard at all  Food Insecurity: No Food Insecurity (06/08/2022)   Hunger Vital Sign    Worried About Running Out of Food in the Last Year: Never true    Ran Out of Food in the Last Year: Never true  Transportation Needs: No Transportation Needs (06/08/2022)   PRAPARE - Hydrologist (Medical): No    Lack of Transportation (Non-Medical): No  Physical  Activity: Insufficiently Active (06/08/2022)   Exercise Vital Sign    Days of Exercise per Week: 3 days    Minutes of Exercise per Session: 20 min  Stress: No Stress Concern Present (06/08/2022)   [place]    Feeling of Stress : Only a little  Social Connections: Moderately Isolated (06/08/2022)   Social Connection and Isolation Panel [NHANES]    Frequency of Communication with Friends and Family: More than three times a week    Frequency of Social Gatherings with Friends and Family: More than three times a week  Attends Religious Services: Never    Active Member of Clubs or Organizations: No    Attends Archivist Meetings: Never    Marital Status: Married    Tobacco Counseling Counseling given: Not Answered   Clinical Intake:  Pre-visit preparation completed: Yes  Pain : No/denies pain     BMI - recorded: 30.26 Nutritional Status: BMI > 30  Obese Nutritional Risks: None Diabetes: No  How often do you need to have someone help you when you read instructions, pamphlets, or other written materials from your doctor or pharmacy?: 1 - Never  Diabetic?no  Interpreter Needed?: No  Information entered by :: Charlott Rakes, LPN   Activities of Daily Living    06/08/2022   10:51 AM 09/29/2021   10:14 PM  In your present state of health, do you have any difficulty performing the following activities:  Hearing? 1 0  Comment slight loss   Vision? 0 0  Difficulty concentrating or making decisions? 0 0  Walking or climbing stairs? 0 0  Dressing or bathing? 0 0  Doing errands, shopping? 0 0  Preparing Food and eating ? N   Using the Toilet? N   In the past six months, have you accidently leaked urine? N   Do you have problems with loss of bowel control? N   Managing your Medications? N   Managing your Finances? N   Housekeeping or managing your Housekeeping? N     Patient Care Team: [place],  Randa Evens, PA-C as PCP - General (Physician Assistant)  Indicate any recent Medical Services you may have received from other than [facility] providers in the past year (date may be approximate).     Assessment:   This is a routine wellness examination for Courtney Buckley.  Hearing/Vision screen Hearing Screening - Comments:: Pt slight loss  Vision Screening - Comments:: Pt follows up with Dr For annual eye exams   Dietary issues and exercise activities discussed: Current Exercise Habits: Home exercise routine, Type of exercise: Other - see comments, Time (Minutes): 20, Frequency (Times/Week): 3, Weekly Exercise (Minutes/Week): 60   Goals Addressed             This Visit's Progress    Patient Stated       Lose weight        Depression Screen    06/08/2022   10:47 AM 05/25/2022   10:42 AM 05/26/2021   11:19 AM  PHQ 2/9 Scores  PHQ - 2 Score 0 0 0  PHQ- 9 Score  2     Fall Risk    06/08/2022   10:49 AM 07/02/2021   12:59 PM 05/26/2021   11:21 AM 12/27/2020   12:59 PM  [place] in the past year? 0 0 1 0  Number falls in past yr: 0  1 0  Injury with Fall? 0  1 0  Comment    Just bruising  Risk for fall due to : Impaired vision;Impaired balance/gait  Impaired vision;Impaired mobility;Impaired balance/gait   Follow up Falls prevention discussed  Falls prevention discussed     FALL RISK PREVENTION PERTAINING TO THE HOME:  Any stairs in or around the home? No  If so, are there any without handrails? No  Home free of loose throw rugs in walkways, pet beds, electrical cords, etc? Yes  Adequate lighting in your home to reduce risk of falls? Yes   ASSISTIVE DEVICES UTILIZED TO PREVENT FALLS:  Life alert? No  Use of  a cane, walker or w/c? No  Grab bars in the bathroom? Yes  Shower chair or bench in shower? No  Elevated toilet seat or a handicapped toilet? No   TIMED UP AND GO:  Was the test performed? No .  Cognitive Function:        06/08/2022   10:52 AM 05/26/2021    11:23 AM  6CIT Screen  What Year? 0 points 0 points  What month? 0 points 0 points  What time? 0 points 0 points  Count back from 20 0 points 0 points  Months in reverse 0 points 0 points  Repeat phrase 4 points 0 points  Total Score 4 points 0 points    Immunizations Immunization History  Administered Date(s) Administered   Fluad Quad(high Dose 65+) 07/25/2020   Hepatitis A, Ped/Adol-2 Dose 05/07/2010   Hepatitis B, adult 05/07/2010   Hepb-cpg 06/02/2019, 07/27/2019   Influenza Split 09/03/2015, 08/18/2016, 07/15/2017, 08/10/2019   Influenza, High Dose Seasonal PF 08/05/2018, 08/10/2019   PFIZER Comirnaty(Gray Top)Covid-19 Tri-Sucrose Vaccine 02/05/2021   PFIZER(Purple Top)SARS-COV-2 Vaccination 12/15/2019, 01/09/2020, 07/30/2020   PNEUMOCOCCAL CONJUGATE-20 05/25/2022   Pneumococcal Conjugate-13 11/13/2015   Pneumococcal Polysaccharide-23 05/09/2012   Tdap 08/27/2008   Zoster Recombinat (Shingrix) 08/10/2019, 05/04/2020   Zoster, Live 08/10/2019    TDAP status: Due, Education has been provided regarding the importance of this vaccine. Advised may receive this vaccine at local pharmacy or Health Dept. Aware to provide a copy of the vaccination record if obtained from local pharmacy or Health Dept. Verbalized acceptance and understanding.  Flu Vaccine status: Up to date  Pneumococcal vaccine status: Up to date  Covid-19 vaccine status: Completed vaccines  Qualifies for Shingles Vaccine? Yes   Zostavax completed Yes   Shingrix Completed?: Yes  Screening Tests Health Maintenance  Topic Date Due   INFLUENZA VACCINE  06/02/2022   COVID-19 Vaccine (5 - Booster for Pfizer series) 06/10/2022 (Originally 04/02/2021)   TETANUS/TDAP  05/26/2023 (Originally 08/27/2018)   MAMMOGRAM  08/08/2023   Pneumonia Vaccine 20+ Years old  Completed   DEXA SCAN  Completed   Hepatitis C Screening  Completed   Zoster Vaccines- Shingrix  Completed   HPV VACCINES  Aged Out    Health  Maintenance  Health Maintenance Due  Topic Date Due   INFLUENZA VACCINE  06/02/2022    Colorectal cancer screening: No longer required.   Mammogram status: Completed 08/07/21. Repeat every year  Bone Density status: Completed 12/12/21. Results reflect: Bone density results: OSTEOPOROSIS. Repeat every 2 years.  Additional Screening:  Hepatitis C Screening:  Completed 12/07/18  Vision Screening: Recommended annual ophthalmology exams for early detection of glaucoma and other disorders of the eye. Is the patient up to date with their annual eye exam?  Yes  Who is the provider or what is the name of the office in which the patient attends annual eye exams? Unsure of providers name  If pt is not established with a provider, would they like to be referred to a provider to establish care? No .   Dental Screening: Recommended annual dental exams for proper oral hygiene  Community Resource Referral / Chronic Care Management: CRR required this visit?  No   CCM required this visit?  No      Plan:     I have personally reviewed and noted the following in the patient's chart:   Medical and social history Use of alcohol, tobacco or illicit drugs  Current medications and supplements including opioid prescriptions.  Functional  ability and status Nutritional status Physical activity Advanced directives List of other physicians Hospitalizations, surgeries, and ER visits in previous 12 months Vitals Screenings to include cognitive, depression, and falls Referrals and appointments  In addition, I have reviewed and discussed with patient certain preventive protocols, quality metrics, and best practice recommendations. A written personalized care plan for preventive services as well as general preventive health recommendations were provided to patient.     Willette Brace, LPN   7/0/0525   Nurse Notes: pt was having phone trouble , however we were able to completed AWV

## 2022-06-08 NOTE — Progress Notes (Signed)
This encounter was created in error - please disregard. Pt declined call

## 2022-06-18 DIAGNOSIS — D7581 Myelofibrosis: Secondary | ICD-10-CM | POA: Diagnosis not present

## 2022-06-18 DIAGNOSIS — Z8679 Personal history of other diseases of the circulatory system: Secondary | ICD-10-CM | POA: Diagnosis not present

## 2022-06-18 DIAGNOSIS — Z79622 Long term (current) use of janus kinase inhibitor: Secondary | ICD-10-CM | POA: Diagnosis not present

## 2022-06-18 DIAGNOSIS — Z8711 Personal history of peptic ulcer disease: Secondary | ICD-10-CM | POA: Diagnosis not present

## 2022-06-18 DIAGNOSIS — I81 Portal vein thrombosis: Secondary | ICD-10-CM | POA: Diagnosis not present

## 2022-06-18 DIAGNOSIS — G3184 Mild cognitive impairment, so stated: Secondary | ICD-10-CM | POA: Diagnosis not present

## 2022-06-18 DIAGNOSIS — K259 Gastric ulcer, unspecified as acute or chronic, without hemorrhage or perforation: Secondary | ICD-10-CM | POA: Diagnosis not present

## 2022-06-18 DIAGNOSIS — K7682 Hepatic encephalopathy: Secondary | ICD-10-CM | POA: Diagnosis not present

## 2022-06-18 DIAGNOSIS — D75839 Thrombocytosis, unspecified: Secondary | ICD-10-CM | POA: Diagnosis not present

## 2022-06-18 DIAGNOSIS — G43909 Migraine, unspecified, not intractable, without status migrainosus: Secondary | ICD-10-CM | POA: Diagnosis not present

## 2022-06-18 DIAGNOSIS — K7469 Other cirrhosis of liver: Secondary | ICD-10-CM | POA: Diagnosis not present

## 2022-06-18 DIAGNOSIS — I728 Aneurysm of other specified arteries: Secondary | ICD-10-CM | POA: Diagnosis not present

## 2022-06-29 DIAGNOSIS — M25561 Pain in right knee: Secondary | ICD-10-CM | POA: Insufficient documentation

## 2022-06-29 HISTORY — DX: Pain in right knee: M25.561

## 2022-07-07 DIAGNOSIS — M25561 Pain in right knee: Secondary | ICD-10-CM | POA: Diagnosis not present

## 2022-07-15 ENCOUNTER — Other Ambulatory Visit: Payer: Self-pay | Admitting: Internal Medicine

## 2022-07-15 DIAGNOSIS — K259 Gastric ulcer, unspecified as acute or chronic, without hemorrhage or perforation: Secondary | ICD-10-CM

## 2022-07-21 ENCOUNTER — Ambulatory Visit: Payer: Medicare PPO | Admitting: Nurse Practitioner

## 2022-07-21 ENCOUNTER — Encounter: Payer: Self-pay | Admitting: Nurse Practitioner

## 2022-07-21 VITALS — BP 102/62 | HR 80 | Ht 66.0 in | Wt 195.0 lb

## 2022-07-21 DIAGNOSIS — R413 Other amnesia: Secondary | ICD-10-CM | POA: Diagnosis not present

## 2022-07-21 DIAGNOSIS — K7460 Unspecified cirrhosis of liver: Secondary | ICD-10-CM

## 2022-07-21 NOTE — Patient Instructions (Addendum)
_______________________________________________________  If you are age 72 or older, your body mass index should be between 23-30. Your Body mass index is 31.47 kg/m. If this is out of the aforementioned range listed, please consider follow up with your Primary Care Provider.  If you are age 34 or younger, your body mass index should be between 19-25. Your Body mass index is 31.47 kg/m. If this is out of the aformentioned range listed, please consider follow up with your Primary Care Provider.   ________________________________________________________  The [place] [facility] providers would like to encourage you to use [facility] to communicate with providers for non-urgent requests or questions.  Due to long hold times on the telephone, sending your provider a message by [facility] may be a faster and more efficient way to get a response.  Please allow 48 business hours for a response.  Please remember that this is for non-urgent requests.  _______________________________________________________  Please start Miralax 1 capful twice a day. Call in a week with an update, the goal is to have 2 BMs a day.   It was a pleasure to see you today!  Thank you for trusting me with your gastrointestinal care!

## 2022-07-21 NOTE — Progress Notes (Signed)
Chief Complaint:  elevated ammonia   Assessment &  Plan   #71 year old female with history of cirrhosis with portal hypertension (hepatic encephalopathy, esophageal varices), chronic portal vein thrombosis.  Patient had a comprehensive visit with Lajuana Ripple 04/16/2022, please refer to that office note for details. She is already scheduled for 47-monthfollow-up with Dr. PHilarie Fredrickson  She was worked in today for evaluation of elevated ammonia found on labs by Hematologist She is up-to-date on varices screening, HSoledadscreening No evidence for hepatic encephalopathy on exam today.  We discussed that ammonia levels do not always correlate with signs/symptoms of encephalopathy.  She is quite constipated increasing her risk for recurrent hepatic encephalopathy .  Continue Xifaxan.  Will treat constipation.   # Short term memory loss. Not sure this is related to hepatic encephalopathy. She is not showing signs of HE in clinic but tells me she has been getting lost driving to familiar places lately. She is tearful about this new development.  Seems unlikely related to HE but we do need to get bowels moving.  If symptoms persist then she may need cognitive testing.   # Constipation.  She was not able to tolerate lactulose due to nausea.  Currently not taking anything for constipation and going several days without a bowel movement.  Trial of MiraLAX 1 capful in 8 ounces of water twice daily She will call uKoreain a week if not having at least 2 bowel movements a week.   #Myelofibrosis followed by Hematology   HPI   JJosilynn Loshis a 71y.o. female known to Dr. 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   In summary, she had been having some confusion after her Xifaxan and lactulose had been stopped during prior hospitalization.  We had restarted Xifaxan and she was showing improvement.  We noted that she was not currently on diuretics but there was no apparent ascites at the time of her visit with JJanett Billow We ordered a repeat CT scan to follow-up on splenic artery embolization as well as a recent splenic infarct.  She was supposed of had repeat abdominal imaging in February but it apparently was not done.  The CT abdomen pelvis angiography was stable.  Liver without concerning liver lesions.  Dr. PHilarie Fredricksonrecommended that patient follow-up with him in about 6 months.     Interval History:  From what I can tell the patient was worked in today for evaluation of elevated ammonia level on 8/17 when it was 151 ( Hematologist's office). She is taking Xifaxan 550 mg BID. She has chronic constipation and sometimes goes days without a BM. She didn't tolerate lactulose due to nausea. She hasn't tried Miralax. She has frequent nausea, takes Zofran once a day which can contribute to nausea.  Other than frequent nausea she feels okay  JDoyneis concerned about recent development of short-term memory loss.  She has gotten lost a couple of times on her way to familiar places.  She is very emotional about this new development.  She lives alone but is in close contact with her daughter on a daily basis.  Family nor friends have mentioned anything to her about confusion/forgetfulness  Labs:     Latest Ref Rng & Units 10/02/2021   12:59 AM 10/01/2021    3:51 AM 09/30/2021   12:56 AM  CBC  WBC 4.0 - 10.5 K/uL 32.8  34.1  35.3   Hemoglobin 12.0 - 15.0 g/dL 8.3  8.3  7.0   Hematocrit 36.0 - 46.0 % 26.1  25.6  22.4   Platelets 150 - 400 K/uL 469  512  750        Latest Ref Rng & Units 10/01/2021    3:51 AM 09/30/2021   12:56 AM 09/28/2021    7:33 PM  Hepatic Function  Total Protein 6.5 - 8.1 g/dL  3.8  3.8  4.0   Albumin 3.5 - 5.0 g/dL 2.1  2.2  2.4   AST 15 - 41 U/L '20  23  28   ' ALT 0 - 44 U/L '13  14  16   ' Alk Phosphatase 38 - 126 U/L 84  91  83   Total Bilirubin 0.3 - 1.2 mg/dL 2.4  3.3  2.0   Bilirubin, Direct 0.0 - 0.2 mg/dL   0.4      Past Medical History:  Diagnosis Date   Aneurysm, splenic artery (HCC)    Cancer ([place])    Bone Marrow Cancer   Cirrhosis ([place])    Colon polyps    Depression    Esophageal varices (HCC)    Gastric ulcer    Gastritis    Internal hemorrhoids    Migraine    Portal hypertensive gastropathy (HCC)    PVT (portal vein thrombosis)     Past Surgical History:  Procedure Laterality Date   BIOPSY  09/30/2021   Procedure: BIOPSY;  Surgeon: Lavena Bullion, DO;  Location: [facility];  Service: Gastroenterology;;   ESOPHAGOGASTRODUODENOSCOPY (EGD) WITH PROPOFOL N/A 09/30/2021   Procedure: ESOPHAGOGASTRODUODENOSCOPY (EGD) WITH PROPOFOL;  Surgeon: Lavena Bullion, DO;  Location: [facility];  Service: Gastroenterology;  Laterality: N/A;   FLEXIBLE SIGMOIDOSCOPY N/A 09/30/2021   Procedure: FLEXIBLE SIGMOIDOSCOPY;  Surgeon: Lavena Bullion, DO;  Location: [facility];  Service: Gastroenterology;  Laterality: N/A;   IR ANGIOGRAM SELECTIVE EACH ADDITIONAL VESSEL  09/18/2021   IR ANGIOGRAM VISCERAL SELECTIVE  09/18/2021   IR EMBO ARTERIAL NOT HEMORR HEMANG INC GUIDE ROADMAPPING  09/18/2021   IR RADIOLOGIST EVAL & MGMT  09/09/2021   IR US GUIDE VASC ACCESS RIGHT  09/18/2021   LIVER BIOPSY     RADIOLOGY WITH ANESTHESIA N/A 09/18/2021   Procedure: IR WITH ANESTHESIA SPLENIC ARTERY EMBOLIZATION;  Surgeon: Criselda Peaches, MD;  Location: [facility];  Service: Radiology;  Laterality: N/A;    Current Medications, Allergies, Family History and Social History were reviewed in [employer] record.     Current Outpatient Medications  Medication Sig Dispense Refill   clonazePAM (KLONOPIN) 0.5 MG tablet Take one to two tabs po  qhs prn for anxiety and sleep. (Patient not taking: Reported on 06/08/2022) 30 tablet 2   JAKAFI 10 MG tablet Take 10 mg by mouth 2 (two) times daily.     Multiple Vitamin (MULTI VITAMIN PO) Take 1 tablet by mouth daily.     ondansetron (ZOFRAN) 4 MG tablet TAKE 1 TABLET (4 MG TOTAL) BY MOUTH EVERY 6 (SIX) HOURS AS NEEDED FOR NAUSEA OR VOMITING 12 tablet 2   pantoprazole (PROTONIX) 40 MG tablet TAKE 1 TABLET BY MOUTH TWICE A DAY 180 tablet 1   VITAMIN D PO Take by mouth.  No current facility-administered medications for this visit.    Review of Systems: No chest pain. No shortness of breath. No urinary complaints.    Physical Exam  Wt Readings from Last 3 Encounters:  05/25/22 187 lb 6.4 oz (85 kg)  04/16/22 184 lb (83.5 kg)  12/19/21 176 lb 4 oz (79.9 kg)    BP 102/62   Pulse 80   Ht '5\' 6"'  (1.676 m)   Wt 195 lb (88.5 kg)   SpO2 95%   BMI 31.47 kg/m  Constitutional:  Generally well appearing female in no acute distress. Psychiatric: Pleasant. Normal mood and affect. Behavior is normal. EENT: Pupils normal.  Conjunctivae are normal. No scleral icterus. Neck supple.  Cardiovascular: Normal rate, regular rhythm. No edema Pulmonary/chest: Effort normal and breath sounds normal. No wheezing, rales or rhonchi. Abdominal: Soft, nondistended, nontender. Bowel sounds active throughout. There are no masses palpable. No hepatomegaly. Neurological: Alert and oriented to person place and time.  No asterixis.  She can quickly do basic math in her head Skin: Skin is warm and dry. No rashes noted.  I spent 30 minutes total reviewing records, obtaining history, performing exam, counseling patient and documenting visit / findings.   Tye Savoy, NP  07/21/2022, 8:27 AM

## 2022-07-28 NOTE — Progress Notes (Signed)
Addendum: Reviewed and agree with assessment and management plan. Sam Overbeck M, MD  

## 2022-07-29 ENCOUNTER — Ambulatory Visit: Payer: Medicare PPO | Admitting: Dermatology

## 2022-07-31 DIAGNOSIS — S83241A Other tear of medial meniscus, current injury, right knee, initial encounter: Secondary | ICD-10-CM | POA: Diagnosis not present

## 2022-08-27 NOTE — Progress Notes (Signed)
Sent message, via epic in basket, requesting orders in epic from surgeon.  

## 2022-08-30 NOTE — Progress Notes (Signed)
COVID Vaccine received:  '[]'$  No '[x]'$  Yes Date of any COVID positive Test in last 90 days:  PCP - Alyssa Allwardt, PA-C  Cardiologist - none  Gastroenterology_ Dr. Zenovia Jarred, Tye Savoy, NP at [facility],  [place]   [place], [place] [address]   [phone] (Work)   [phone] (Fax)    Chest x-ray - 03-07-21  CEW EKG -  09-03-21   Will repeat at PST Stress Test -  ECHO - 09-02-21 Cardiac Cath -   Pacemaker/ICD device     '[x]'$  N/A Spinal Cord Stimulator:'[x]'$  No '[]'$  Yes      (Remind patient to bring remote DOS) Other Implants:   History of Sleep Apnea? '[x]'$  No '[]'$  Yes   Sleep Study Date:   CPAP used?- '[x]'$  No '[]'$  Yes  (Instruct to bring their mask & Tubing)  Does the patient monitor blood sugar? '[]'$  No '[]'$  Yes  '[]'$  N/A Does patient have a [employer] or Dexacom? '[]'$  No '[]'$  Yes   Fasting Blood Sugar Ranges-  Checks Blood Sugar _____ times a day  Blood Thinner Instructions:  None Aspirin Instructions: Last Dose:  ERAS Protocol Ordered: '[]'$  No  '[x]'$  Yes PRE-SURGERY '[x]'$  ENSURE  '[]'$  G2   Comments: Short term memory loss possibly from Hepatic encephalopathy.   Activity level: Patient can / can not climb a flight of stairs without difficulty;  '[]'$  No CP  '[]'$  No SOB,  but would have ______   Patient can / can not perform ADLs without assistance.   Anesthesia review: CHF, thrombocytosis. liver cirrhosis, myelofibrosis, bone marrow cancer, esophageal varices,   Patient denies shortness of breath, fever, cough and chest pain at PAT appointment.  Patient verbalized understanding and agreement to the Pre-Surgical Instructions that were given to them at this PAT appointment. Patient was also educated of the need to review these PAT instructions again prior to his/her surgery.I reviewed the appropriate phone numbers to call if they have any and questions or concerns.

## 2022-08-30 NOTE — Patient Instructions (Signed)
SURGICAL WAITING ROOM VISITATION Patients having surgery or a procedure may have no more than 2 support people in the waiting area - these visitors may rotate in the visitor waiting room.   Children under the age of 62 must have an adult with them who is not the patient. If the patient needs to stay at the hospital during part of their recovery, the visitor guidelines for inpatient rooms apply.  PRE-OP VISITATION  Pre-op nurse will coordinate an appropriate time for 1 support person to accompany the patient in pre-op.  This support person may not rotate.  This visitor will be contacted when the time is appropriate for the visitor to come back in the pre-op area.  Please refer to the [facility] website for the visitor guidelines for Inpatients (after your surgery is over and you are in a regular room).  You are not required to quarantine at this time prior to your surgery. However, you must do this: Hand Hygiene often Do NOT share personal items Notify your provider if you are in close contact with someone who has COVID or you develop fever 100.4 or greater, new onset of sneezing, cough, sore throat, shortness of breath or body aches.  If you test positive for Covid or have been in contact with anyone that has tested positive in the last 10 days please notify you surgeon.    Your procedure is scheduled on:  Wednesday September 02, 2022  Report to [facility] Main Entrance: [address] entrance where the [employer] is available.   Report to admitting at:  2:30 PM  +++++Call this number if you have any questions or problems the morning of surgery [phone]  Do not eat food after Midnight the night prior to your surgery/procedure.  After Midnight you may have the following liquids until  1:45 PM DAY OF SURGERY  Clear Liquid Diet Water Black Coffee (sugar ok, NO MILK/CREAM OR CREAMERS)  Tea (sugar ok, NO MILK/CREAM OR CREAMERS) regular and decaf                              Plain Jell-O  with no fruit (NO RED)                                           Fruit ices (not with fruit pulp, NO RED)                                     Popsicles (NO RED)                                                                  Juice: apple, WHITE grape, WHITE cranberry Sports drinks like Gatorade or Powerade (NO RED)                    The day of surgery:  Drink ONE (1) Pre-Surgery Clear Ensure at   1:45 PM the morning of surgery. Drink in one sitting. Do not sip.  This drink was given to you  during your hospital pre-op appointment visit. Nothing else to drink after completing the Pre-Surgery Clear Ensure or G2 : No candy, chewing gum or throat lozenges.    FOLLOW ANY ADDITIONAL PRE OP INSTRUCTIONS YOU RECEIVED FROM YOUR SURGEON'S OFFICE!!!   Oral Hygiene is also important to reduce your risk of infection.        Remember - BRUSH YOUR TEETH THE MORNING OF SURGERY WITH YOUR REGULAR TOOTHPASTE  Do NOT smoke after Midnight the night before surgery.  Take ONLY these medicines the morning of surgery with A SIP OF WATER: Jakafi, rifaximin, Pantoprazole and Zofran if you have nausea.                   You may not have any metal on your body including hair pins, jewelry, and body piercing  Do not wear make-up, lotions, powders, perfumes or deodorant  Do not wear nail polish including gel and S&S, artificial / acrylic nails, or any other type of covering on natural nails including finger and toenails. If you have artificial nails, gel coating, etc., that needs to be removed by a nail salon, Please have this removed prior to surgery. Not doing so may mean that your surgery could be cancelled or delayed if the Surgeon or anesthesia staff feels like they are unable to monitor you safely.   Do not shave 48 hours prior to surgery to avoid nicks in your skin which may contribute to postoperative infections.   Contacts, Hearing Aids, dentures or bridgework may not be worn into  surgery.   Patients discharged on the day of surgery will not be allowed to drive home.  Someone NEEDS to stay with you for the first 24 hours after anesthesia.  Do not bring your home medications to the hospital. The Pharmacy will dispense medications listed on your medication list to you during your admission in the Hospital.  Special Instructions: Bring a copy of your healthcare power of attorney and living will documents the day of surgery, if you wish to have them scanned into your [facility] Records- EPIC  Please read over the following fact sheets you were given: IF YOU HAVE QUESTIONS ABOUT YOUR PRE-OP INSTRUCTIONS, PLEASE CALL [phone]  ([place])   [facility] - Preparing for Surgery Before surgery, you can play an important role.  Because skin is not sterile, your skin needs to be as free of germs as possible.  You can reduce the number of germs on your skin by washing with CHG (chlorahexidine gluconate) soap before surgery.  CHG is an antiseptic cleaner which kills germs and bonds with the skin to continue killing germs even after washing. Please DO NOT use if you have an allergy to CHG or antibacterial soaps.  If your skin becomes reddened/irritated stop using the CHG and inform your nurse when you arrive at Short Stay. Do not shave (including legs and underarms) for at least 48 hours prior to the first CHG shower.  You may shave your face/neck.  Please follow these instructions carefully:  1.  Shower with CHG Soap the night before surgery and the  morning of surgery.  2.  If you choose to wash your hair, wash your hair first as usual with your normal  shampoo.  3.  After you shampoo, rinse your hair and body thoroughly to remove the shampoo.  4.  Use CHG as you would any other liquid soap.  You can apply chg directly to the skin and wash.  Gently with a scrungie or clean washcloth.  5.  Apply the CHG Soap to your body ONLY FROM THE NECK DOWN.   Do  not use on face/ open                           Wound or open sores. Avoid contact with eyes, ears mouth and genitals (private parts).                       Wash face,  Genitals (private parts) with your normal soap.             6.  Wash thoroughly, paying special attention to the area where your  surgery  will be performed.  7.  Thoroughly rinse your body with warm water from the neck down.  8.  DO NOT shower/wash with your normal soap after using and rinsing off the CHG Soap.            9.  Pat yourself dry with a clean towel.            10.  Wear clean pajamas.            11.  Place clean sheets on your bed the night of your first shower and do not  sleep with pets.  ON THE DAY OF SURGERY : Do not apply any lotions/deodorants the morning of surgery.  Please wear clean clothes to the hospital/surgery center.    FAILURE TO FOLLOW THESE INSTRUCTIONS MAY RESULT IN THE CANCELLATION OF YOUR SURGERY  PATIENT SIGNATURE_________________________________  NURSE SIGNATURE__________________________________  ________________________________________________________________________          Adam Phenix    An incentive spirometer is a tool that can help keep your lungs clear and active. This tool measures how well you are filling your lungs with each breath. Taking long deep breaths may help reverse or decrease the chance of developing breathing (pulmonary) problems (especially infection) following: A long period of time when you are unable to move or be active. BEFORE THE PROCEDURE  If the spirometer includes an indicator to show your best effort, your nurse or respiratory therapist will set it to a desired goal. If possible, sit up straight or lean slightly forward. Try not to slouch. Hold the incentive spirometer in an upright position. INSTRUCTIONS FOR USE  Sit on the edge of your bed if possible, or sit up as far as you can in bed or on a chair. Hold the incentive spirometer  in an upright position. Breathe out normally. Place the mouthpiece in your mouth and seal your lips tightly around it. Breathe in slowly and as deeply as possible, raising the piston or the ball toward the top of the column. Hold your breath for 3-5 seconds or for as long as possible. Allow the piston or ball to fall to the bottom of the column. Remove the mouthpiece from your mouth and breathe out normally. Rest for a few seconds and repeat Steps 1 through 7 at least 10 times every 1-2 hours when you are awake. Take your time and take a few normal breaths between deep breaths. The spirometer may include an indicator to show your best effort. Use the indicator as a goal to work toward during each repetition. After each set of 10 deep breaths, practice  coughing to be sure your lungs are clear. If you have an incision (the cut made at the time of surgery), support your incision when coughing by placing a pillow or rolled up towels firmly against it. Once you are able to get out of bed, walk around indoors and cough well. You may stop using the incentive spirometer when instructed by your caregiver.  RISKS AND COMPLICATIONS Take your time so you do not get dizzy or light-headed. If you are in pain, you may need to take or ask for pain medication before doing incentive spirometry. It is harder to take a deep breath if you are having pain. AFTER USE Rest and breathe slowly and easily. It can be helpful to keep track of a log of your progress. Your caregiver can provide you with a simple table to help with this. If you are using the spirometer at home, follow these instructions: [place] IF:  You are having difficultly using the spirometer. You have trouble using the spirometer as often as instructed. Your pain medication is not giving enough relief while using the spirometer. You develop fever of 100.5 F (38.1 C) or higher.                                                                                                     SEEK IMMEDIATE MEDICAL CARE IF:  You cough up bloody sputum that had not been present before. You develop fever of 102 F (38.9 C) or greater. You develop worsening pain at or near the incision site. MAKE SURE YOU:  Understand these instructions. Will watch your condition. Will get help right away if you are not doing well or get worse. Document Released: 03/01/2007 Document Revised: 01/11/2012 Document Reviewed: 05/02/2007 [facility] Patient Information 2014 [place], [place].

## 2022-08-31 ENCOUNTER — Encounter (HOSPITAL_COMMUNITY): Payer: Self-pay

## 2022-08-31 ENCOUNTER — Encounter (HOSPITAL_COMMUNITY)
Admission: RE | Admit: 2022-08-31 | Discharge: 2022-08-31 | Disposition: A | Discharge status: Home / Self Care | Payer: Medicare PPO | Source: Ambulatory Visit | Attending: Orthopedic Surgery | Admitting: Orthopedic Surgery

## 2022-08-31 ENCOUNTER — Other Ambulatory Visit: Payer: Self-pay

## 2022-08-31 VITALS — BP 128/80 | HR 74 | Temp 98.4°F | Resp 16 | Ht 66.0 in | Wt 195.0 lb

## 2022-08-31 DIAGNOSIS — Z01818 Encounter for other preprocedural examination: Secondary | ICD-10-CM | POA: Insufficient documentation

## 2022-08-31 DIAGNOSIS — K769 Liver disease, unspecified: Secondary | ICD-10-CM | POA: Diagnosis not present

## 2022-08-31 HISTORY — DX: Thrombocytosis, unspecified: D75.839

## 2022-08-31 HISTORY — DX: Myelofibrosis: D75.81

## 2022-08-31 HISTORY — DX: Unspecified cirrhosis of liver: K74.60

## 2022-08-31 LAB — COMPREHENSIVE METABOLIC PANEL
ALT: 16 U/L (ref 0–44)
AST: 30 U/L (ref 15–41)
Albumin: 3.2 g/dL — ABNORMAL LOW (ref 3.5–5.0)
Alkaline Phosphatase: 96 U/L (ref 38–126)
Anion gap: 4 — ABNORMAL LOW (ref 5–15)
BUN: 18 mg/dL (ref 8–23)
CO2: 28 mmol/L (ref 22–32)
Calcium: 9.1 mg/dL (ref 8.9–10.3)
Chloride: 111 mmol/L (ref 98–111)
Creatinine, Ser: 0.64 mg/dL (ref 0.44–1.00)
GFR, Estimated: >60 mL/min (ref >60)
Glucose, Bld: 99 mg/dL (ref 70–99)
Potassium: 4 mmol/L (ref 3.5–5.1)
Sodium: 143 mmol/L (ref 135–145)
Total Bilirubin: 3 mg/dL — ABNORMAL HIGH (ref 0.3–1.2)
Total Protein: 5.5 g/dL — ABNORMAL LOW (ref 6.5–8.1)

## 2022-08-31 LAB — CBC
HCT: 36.8 % (ref 36.0–46.0)
Hemoglobin: 11.6 g/dL — ABNORMAL LOW (ref 12.0–15.0)
MCH: 26.1 pg (ref 26.0–34.0)
MCHC: 31.5 g/dL (ref 30.0–36.0)
MCV: 82.7 fL (ref 80.0–100.0)
Platelets: 487 10*3/uL — ABNORMAL HIGH (ref 150–400)
RBC: 4.45 MIL/uL (ref 3.87–5.11)
RDW: 26.8 % — ABNORMAL HIGH (ref 11.5–15.5)
WBC: 7.8 10*3/uL (ref 4.0–10.5)
nRBC: 0.5 % — ABNORMAL HIGH (ref 0.0–0.2)

## 2022-09-01 ENCOUNTER — Encounter: Payer: Self-pay | Admitting: Internal Medicine

## 2022-09-02 ENCOUNTER — Encounter (HOSPITAL_COMMUNITY): Payer: Self-pay | Admitting: Orthopedic Surgery

## 2022-09-02 ENCOUNTER — Ambulatory Visit (HOSPITAL_BASED_OUTPATIENT_CLINIC_OR_DEPARTMENT_OTHER): Payer: Medicare PPO | Admitting: Anesthesiology

## 2022-09-02 ENCOUNTER — Ambulatory Visit (HOSPITAL_COMMUNITY)
Admission: RE | Admit: 2022-09-02 | Discharge: 2022-09-02 | Disposition: A | Discharge status: Home / Self Care | Payer: Medicare PPO | Attending: Orthopedic Surgery | Admitting: Orthopedic Surgery

## 2022-09-02 ENCOUNTER — Other Ambulatory Visit: Payer: Self-pay

## 2022-09-02 ENCOUNTER — Encounter (HOSPITAL_COMMUNITY)
Admission: RE | Disposition: A | Discharge status: Home / Self Care | Payer: Self-pay | Source: Home / Self Care | Attending: Orthopedic Surgery

## 2022-09-02 ENCOUNTER — Ambulatory Visit (HOSPITAL_COMMUNITY): Payer: Medicare PPO | Admitting: Emergency Medicine

## 2022-09-02 DIAGNOSIS — S83249A Other tear of medial meniscus, current injury, unspecified knee, initial encounter: Secondary | ICD-10-CM | POA: Diagnosis present

## 2022-09-02 DIAGNOSIS — D759 Disease of blood and blood-forming organs, unspecified: Secondary | ICD-10-CM | POA: Diagnosis not present

## 2022-09-02 DIAGNOSIS — E669 Obesity, unspecified: Secondary | ICD-10-CM

## 2022-09-02 DIAGNOSIS — X58XXXA Exposure to other specified factors, initial encounter: Secondary | ICD-10-CM | POA: Insufficient documentation

## 2022-09-02 DIAGNOSIS — D649 Anemia, unspecified: Secondary | ICD-10-CM

## 2022-09-02 DIAGNOSIS — I739 Peripheral vascular disease, unspecified: Secondary | ICD-10-CM | POA: Insufficient documentation

## 2022-09-02 DIAGNOSIS — S83241A Other tear of medial meniscus, current injury, right knee, initial encounter: Secondary | ICD-10-CM | POA: Diagnosis not present

## 2022-09-02 DIAGNOSIS — Z6831 Body mass index (BMI) 31.0-31.9, adult: Secondary | ICD-10-CM | POA: Diagnosis not present

## 2022-09-02 DIAGNOSIS — Z8631 Personal history of diabetic foot ulcer: Secondary | ICD-10-CM

## 2022-09-02 DIAGNOSIS — M23221 Derangement of posterior horn of medial meniscus due to old tear or injury, right knee: Secondary | ICD-10-CM | POA: Diagnosis not present

## 2022-09-02 HISTORY — DX: Other tear of medial meniscus, current injury, unspecified knee, initial encounter: S83.249A

## 2022-09-02 HISTORY — PX: KNEE ARTHROSCOPY: SHX127

## 2022-09-02 SURGERY — ARTHROSCOPY, KNEE
Anesthesia: General | Site: Knee | Laterality: Right

## 2022-09-02 MED ORDER — ONDANSETRON HCL 4 MG PO TABS: 4.0000 mg | ORAL_TABLET | Freq: Four times a day (QID) | ORAL | 1 refills | Status: DC | PRN

## 2022-09-02 MED ORDER — DEXAMETHASONE SODIUM PHOSPHATE 10 MG/ML IJ SOLN
INTRAMUSCULAR | Status: AC
  Filled 2022-09-02: qty 1

## 2022-09-02 MED ORDER — PROPOFOL 10 MG/ML IV BOLUS
INTRAVENOUS | Status: DC | PRN
  Administered 2022-09-02: 130 mg via INTRAVENOUS

## 2022-09-02 MED ORDER — CEFAZOLIN SODIUM-DEXTROSE 2-4 GM/100ML-% IV SOLN
2.0000 g | INTRAVENOUS | Status: AC
  Administered 2022-09-02: 2 g via INTRAVENOUS
  Filled 2022-09-02: qty 100

## 2022-09-02 MED ORDER — OXYCODONE HCL 5 MG PO TABS
5.0000 mg | ORAL_TABLET | Freq: Once | ORAL | Status: AC | PRN
  Administered 2022-09-02: 5 mg via ORAL

## 2022-09-02 MED ORDER — LIDOCAINE 2% (20 MG/ML) 5 ML SYRINGE
INTRAMUSCULAR | Status: DC | PRN
  Administered 2022-09-02: 40 mg via INTRAVENOUS

## 2022-09-02 MED ORDER — PROPOFOL 10 MG/ML IV BOLUS
INTRAVENOUS | Status: AC
  Filled 2022-09-02: qty 20

## 2022-09-02 MED ORDER — FENTANYL CITRATE (PF) 100 MCG/2ML IJ SOLN
INTRAMUSCULAR | Status: AC
  Filled 2022-09-02: qty 2

## 2022-09-02 MED ORDER — CHLORHEXIDINE GLUCONATE 0.12 % MT SOLN
15.0000 mL | Freq: Once | OROMUCOSAL | Status: AC
  Administered 2022-09-02: 15 mL via OROMUCOSAL

## 2022-09-02 MED ORDER — OXYCODONE HCL 5 MG/5ML PO SOLN: 5.0000 mg | Freq: Once | ORAL | Status: AC | PRN

## 2022-09-02 MED ORDER — OXYCODONE HCL 5 MG PO TABS
ORAL_TABLET | ORAL | Status: AC
  Filled 2022-09-02: qty 1

## 2022-09-02 MED ORDER — MIDAZOLAM HCL 2 MG/2ML IJ SOLN
INTRAMUSCULAR | Status: DC | PRN
  Administered 2022-09-02: 2 mg via INTRAVENOUS

## 2022-09-02 MED ORDER — ONDANSETRON HCL 4 MG/2ML IJ SOLN
INTRAMUSCULAR | Status: AC
  Filled 2022-09-02: qty 2

## 2022-09-02 MED ORDER — FENTANYL CITRATE PF 50 MCG/ML IJ SOSY
PREFILLED_SYRINGE | INTRAMUSCULAR | Status: AC
  Filled 2022-09-02: qty 2

## 2022-09-02 MED ORDER — TRANEXAMIC ACID-NACL 1000-0.7 MG/100ML-% IV SOLN
1000.0000 mg | INTRAVENOUS | Status: DC
  Filled 2022-09-02: qty 100

## 2022-09-02 MED ORDER — MIDAZOLAM HCL 2 MG/2ML IJ SOLN
INTRAMUSCULAR | Status: AC
  Filled 2022-09-02: qty 2

## 2022-09-02 MED ORDER — FENTANYL CITRATE PF 50 MCG/ML IJ SOSY
25.0000 ug | PREFILLED_SYRINGE | INTRAMUSCULAR | Status: DC | PRN
  Administered 2022-09-02 (×3): 50 ug via INTRAVENOUS

## 2022-09-02 MED ORDER — BUPIVACAINE-EPINEPHRINE (PF) 0.5% -1:200000 IJ SOLN
INTRAMUSCULAR | Status: DC | PRN
  Administered 2022-09-02: 10 mL

## 2022-09-02 MED ORDER — ONDANSETRON HCL 4 MG/2ML IJ SOLN
INTRAMUSCULAR | Status: DC | PRN
  Administered 2022-09-02: 4 mg via INTRAVENOUS

## 2022-09-02 MED ORDER — BUPIVACAINE-EPINEPHRINE (PF) 0.5% -1:200000 IJ SOLN
INTRAMUSCULAR | Status: AC
  Filled 2022-09-02: qty 30

## 2022-09-02 MED ORDER — DEXAMETHASONE SODIUM PHOSPHATE 10 MG/ML IJ SOLN
8.0000 mg | Freq: Once | INTRAMUSCULAR | Status: AC
  Administered 2022-09-02: 8 mg via INTRAVENOUS

## 2022-09-02 MED ORDER — SODIUM CHLORIDE 0.9 % IR SOLN
Status: DC | PRN
  Administered 2022-09-02: 6000 mL

## 2022-09-02 MED ORDER — ORAL CARE MOUTH RINSE: 15.0000 mL | Freq: Once | OROMUCOSAL | Status: AC

## 2022-09-02 MED ORDER — FENTANYL CITRATE (PF) 100 MCG/2ML IJ SOLN
INTRAMUSCULAR | Status: DC | PRN
  Administered 2022-09-02 (×2): 25 ug via INTRAVENOUS

## 2022-09-02 MED ORDER — FENTANYL CITRATE PF 50 MCG/ML IJ SOSY
PREFILLED_SYRINGE | INTRAMUSCULAR | Status: AC
  Filled 2022-09-02: qty 1

## 2022-09-02 MED ORDER — LACTATED RINGERS IV SOLN: INTRAVENOUS | Status: DC

## 2022-09-02 MED ORDER — ONDANSETRON HCL 4 MG/2ML IJ SOLN: 4.0000 mg | Freq: Once | INTRAMUSCULAR | Status: DC | PRN

## 2022-09-02 MED ORDER — LIDOCAINE HCL (PF) 2 % IJ SOLN
INTRAMUSCULAR | Status: AC
  Filled 2022-09-02: qty 5

## 2022-09-02 MED ORDER — ACETAMINOPHEN 10 MG/ML IV SOLN
1000.0000 mg | Freq: Four times a day (QID) | INTRAVENOUS | Status: DC
  Administered 2022-09-02: 1000 mg via INTRAVENOUS
  Filled 2022-09-02: qty 100

## 2022-09-02 SURGICAL SUPPLY — 31 items
BAG COUNTER SPONGE SURGICOUNT (BAG) IMPLANT
BAG SPNG CNTER NS LX DISP (BAG)
BLADE SHAVER TORPEDO 4X13 (MISCELLANEOUS) ×1 IMPLANT
BNDG ELASTIC 6X5.8 VLCR STR LF (GAUZE/BANDAGES/DRESSINGS) ×1 IMPLANT
COVER SURGICAL LIGHT HANDLE (MISCELLANEOUS) ×1 IMPLANT
DRAPE ARTHROSCOPY W/POUCH 114 (DRAPES) ×1 IMPLANT
DRAPE U-SHAPE 47X51 STRL (DRAPES) ×1 IMPLANT
DRSG EMULSION OIL 3X3 NADH (GAUZE/BANDAGES/DRESSINGS) ×1 IMPLANT
DURAPREP 26ML APPLICATOR (WOUND CARE) ×1 IMPLANT
GAUZE 4X4 16PLY ~~LOC~~+RFID DBL (SPONGE) ×1 IMPLANT
GAUZE PAD ABD 7.5X8 STRL (GAUZE/BANDAGES/DRESSINGS) IMPLANT
GAUZE PAD ABD 8X10 STRL (GAUZE/BANDAGES/DRESSINGS) ×1 IMPLANT
GAUZE SPONGE 4X4 12PLY STRL (GAUZE/BANDAGES/DRESSINGS) ×1 IMPLANT
GLOVE BIO SURGEON STRL SZ 6.5 (GLOVE) ×2 IMPLANT
GLOVE BIO SURGEON STRL SZ8 (GLOVE) ×2 IMPLANT
GLOVE BIOGEL PI IND STRL 7.0 (GLOVE) ×2 IMPLANT
GLOVE BIOGEL PI IND STRL 8 (GLOVE) ×1 IMPLANT
GLOVE SURG SS PI 7.0 STRL IVOR (GLOVE) ×1 IMPLANT
GOWN STRL REUS W/ TWL LRG LVL3 (GOWN DISPOSABLE) ×1 IMPLANT
GOWN STRL REUS W/TWL LRG LVL3 (GOWN DISPOSABLE) ×1
KIT BASIN OR (CUSTOM PROCEDURE TRAY) ×1 IMPLANT
MANIFOLD NEPTUNE II (INSTRUMENTS) ×1 IMPLANT
PACK ARTHROSCOPY WL (CUSTOM PROCEDURE TRAY) ×1 IMPLANT
PADDING CAST COTTON 6X4 STRL (CAST SUPPLIES) ×1 IMPLANT
PORT APPOLLO RF 90DEGREE MULTI (SURGICAL WAND) ×1 IMPLANT
PROTECTOR NERVE ULNAR (MISCELLANEOUS) ×1 IMPLANT
SUT ETHILON 4 0 PS 2 18 (SUTURE) ×1 IMPLANT
TOWEL OR 17X26 10 PK STRL BLUE (TOWEL DISPOSABLE) ×1 IMPLANT
TUBING ARTHROSCOPY IRRIG 16FT (MISCELLANEOUS) ×1 IMPLANT
TUBING CONNECTING 10 (TUBING) ×1 IMPLANT
WRAP KNEE MAXI GEL POST OP (GAUZE/BANDAGES/DRESSINGS) ×1 IMPLANT

## 2022-09-02 NOTE — Transfer of Care (Signed)
Immediate Anesthesia Transfer of Care Note  Patient: Courtney Buckley  Procedure(s) Performed: Right knee arthroscopy; medial meniscal debridement (Right: Knee)  Patient Location: PACU  Anesthesia Type:General  Level of Consciousness: awake, alert  and oriented  Airway & Oxygen Therapy: Patient Spontanous Breathing and Patient connected to face mask oxygen  Post-op Assessment: Report given to RN, Post -op Vital signs reviewed and stable and Patient moving all extremities X 4  Post vital signs: Reviewed and stable  Last Vitals:  Vitals Value Taken Time  BP 131/93   Temp    Pulse 63 09/02/22 1602  Resp 17 09/02/22 1602  SpO2 100 % 09/02/22 1602  Vitals shown include unvalidated device data.  Last Pain:  Vitals:   09/02/22 1406  TempSrc:   PainSc: 3          Complications: No notable events documented.

## 2022-09-02 NOTE — Anesthesia Procedure Notes (Signed)
Procedure Name: LMA Insertion Date/Time: 09/02/2022 3:12 PM  Performed by: Niel Hummer, CRNAPre-anesthesia Checklist: Emergency Drugs available, Patient identified, Suction available and Patient being monitored Patient Re-evaluated:Patient Re-evaluated prior to induction Oxygen Delivery Method: Circle system utilized Preoxygenation: Pre-oxygenation with 100% oxygen Induction Type: IV induction LMA: LMA inserted LMA Size: 4.0 Number of attempts: 1 Dental Injury: Teeth and Oropharynx as per pre-operative assessment

## 2022-09-02 NOTE — Interval H&P Note (Signed)
History and Physical Interval Note:  09/02/2022 1:49 PM  Courtney Buckley  has presented today for surgery, with the diagnosis of right knee medial meniscal tear.  The various methods of treatment have been discussed with the patient and family. After consideration of risks, benefits and other options for treatment, the patient has consented to  Procedure(s): Right knee arthroscopy; meniscal debridement (Right) as a surgical intervention.  The patient's history has been reviewed, patient examined, no change in status, stable for surgery.  I have reviewed the patient's chart and labs.  Questions were answered to the patient's satisfaction.     Pilar Plate Daana Petrasek

## 2022-09-02 NOTE — Discharge Instructions (Signed)
You may remove your large bandage and shower in 2 days  Take an 81 mg Aspirin daily for the next 4 weeks starting tomorrow       Dr. Gaynelle Arabian Total [facility] Specialist [facility] [address] [place], [place] [address] [phone]   Arthroscopic Procedure, Knee An arthroscopic procedure can find what is wrong with your knee. PROCEDURE Arthroscopy is a surgical technique that allows your orthopedic surgeon to diagnose and treat your knee injury with accuracy. They will look into your knee through a small instrument. This is almost like a small (pencil sized) telescope. Because arthroscopy affects your knee less than open knee surgery, you can anticipate a more rapid recovery. Taking an active role by following your caregiver's instructions will help with rapid and complete recovery. Use crutches, rest, elevation, ice, and knee exercises as instructed. The length of recovery depends on various factors including type of injury, age, physical condition, medical conditions, and your rehabilitation. Your knee is the joint between the large bones (femur and tibia) in your leg. Cartilage covers these bone ends which are smooth and slippery and allow your knee to bend and move smoothly. Two menisci, thick, semi-lunar shaped pads of cartilage which form a rim inside the joint, help absorb shock and stabilize your knee. Ligaments bind the bones together and support your knee joint. Muscles move the joint, help support your knee, and take stress off the joint itself. Because of this all programs and physical therapy to rehabilitate an injured or repaired knee require rebuilding and strengthening your muscles. AFTER THE PROCEDURE After the procedure, you will be moved to a recovery area until most of the effects of the medication have worn off. Your caregiver will discuss the test results with you.  Only take over-the-counter or prescription medicines for pain, discomfort, or fever as  directed by your caregiver.  SEEK MEDICAL CARE IF:  You have increased bleeding from your wounds.  You see redness, swelling, or have increasing pain in your wounds.  You have pus coming from your wound.  You have an oral temperature above 102 F (38.9 C).  You notice a bad smell coming from the wound or dressing.  You have severe pain with any motion of your knee.  SEEK IMMEDIATE MEDICAL CARE IF:  You develop a rash.  You have difficulty breathing.  You have any allergic problems.  FURTHER INSTRUCTIONS:  ICE to the affected knee every three hours for 30 minutes at a time and then as needed for pain and swelling.  Continue to use ice on the knee for pain and swelling from surgery. You may notice swelling that will progress down to the foot and ankle.  This is normal after surgery.  Elevate the leg when you are not up walking on it.    DIET You may resume your previous home diet once your are discharged from the hospital.  DRESSING / WOUND CARE / SHOWERING You may start showering two days after being discharged home but do not submerge the incisions under water.  Change dressing 48 hours after the procedure and then cover the small incisions with band aids until your follow up visit.   ACTIVITY Walk with your walker as instructed. Use walker as long as suggested by your caregivers. Avoid periods of inactivity such as sitting longer than an hour when not asleep. This helps prevent blood clots.  You may resume a sexual relationship in one month or when given the OK by your  doctor.  You may return to work once you are cleared by your doctor.  Do not drive a car for 6 weeks or until released by you surgeon.  Do not drive while taking narcotics.  WEIGHT BEARING Weight bearing as tolerated  POSTOPERATIVE CONSTIPATION PROTOCOL Constipation - defined medically as fewer than three stools per week and severe constipation as less than one stool per week.  One of the most common issues  patients have following surgery is constipation.  Even if you have a regular bowel pattern at home, your normal regimen is likely to be disrupted due to multiple reasons following surgery.  Combination of anesthesia, postoperative narcotics, change in appetite and fluid intake all can affect your bowels.  In order to avoid complications following surgery, here are some recommendations in order to help you during your recovery period.  Colace (docusate) - Pick up an over-the-counter form of Colace or another stool softener and take twice a day as long as you are requiring postoperative pain medications.  Take with a full glass of water daily.  If you experience loose stools or diarrhea, hold the colace until you stool forms back up.  If your symptoms do not get better within 1 week or if they get worse, check with your doctor.  Dulcolax (bisacodyl) - Pick up over-the-counter and take as directed by the product packaging as needed to assist with the movement of your bowels.  Take with a full glass of water.  Use this product as needed if not relieved by Colace only.   MiraLax (polyethylene glycol) - Pick up over-the-counter to have on hand.  MiraLax is a solution that will increase the amount of water in your bowels to assist with bowel movements.  Take as directed and can mix with a glass of water, juice, soda, coffee, or tea.  Take if you go more than two days without a movement. Do not use MiraLax more than once per day. Call your doctor if you are still constipated or irregular after using this medication for 7 days in a row.  If you continue to have problems with postoperative constipation, please contact the office for further assistance and recommendations.  If you experience "the worst abdominal pain ever" or develop nausea or vomiting, please contact the office immediatly for further recommendations for treatment.  ITCHING  If you experience itching with your medications, try taking only a single  pain pill, or even half a pain pill at a time.  You can also use Benadryl over the counter for itching or also to help with sleep.   TED HOSE STOCKINGS Wear the elastic stockings on both legs for three weeks following surgery during the day but you may remove then at night for sleeping.  MEDICATIONS See your medication summary on the "After Visit Summary" that the nursing staff will review with you prior to discharge.  You may have some home medications which will be placed on hold until you complete the course of blood thinner medication.  It is important for you to complete the blood thinner medication as prescribed by your surgeon.  Continue your approved medications as instructed at time of discharge. Do not drive while taking narcotics.   PRECAUTIONS If you experience chest pain or shortness of breath - call 911 immediately for transfer to the hospital emergency department.  If you develop a fever greater that 101 F, purulent drainage from wound, increased redness or drainage from wound, foul odor from the wound/dressing, or calf  pain - CONTACT YOUR SURGEON.                                                   FOLLOW-UP APPOINTMENTS Make sure you keep all of your appointments after your operation with your surgeon and caregivers. You should call the office at [phone]  and make an appointment for approximately one week after the date of your surgery or on the date instructed by your surgeon outlined in the "After Visit Summary".  RANGE OF MOTION AND STRENGTHENING EXERCISES  Rehabilitation of the knee is important following a knee injury or an operation. After just a few days of immobilization, the muscles of the thigh which control the knee become weakened and shrink (atrophy). Knee exercises are designed to build up the tone and strength of the thigh muscles and to improve knee motion. Often times heat used for twenty to thirty minutes before working out will loosen up your tissues and help  with improving the range of motion but do not use heat for the first two weeks following surgery. These exercises can be done on a training (exercise) mat, on the floor, on a table or on a bed. Use what ever works the best and is most comfortable for you Knee exercises include:  QUAD STRENGTHENING EXERCISES Strengthening Quadriceps Sets  Tighten muscles on top of thigh by pushing knees down into floor or table. Hold for 20 seconds. Repeat 10 times. Do 2 sessions per day.     Strengthening Terminal Knee Extension  With knee bent over bolster, straighten knee by tightening muscle on top of thigh. Be sure to keep bottom of knee on bolster. Hold for 20 seconds. Repeat 10 times. Do 2 sessions per day.   Straight Leg with Bent Knee  Lie on back with opposite leg bent. Keep involved knee slightly bent at knee and raise leg 4-6". Hold for 10 seconds. Repeat 20 times per set. Do 2 sets per session. Do 2 sessions per day.

## 2022-09-02 NOTE — H&P (Signed)
CC- Courtney Buckley is a 71 y.o. female who presents with right knee pain.  HPI- . Knee Pain: Patient presents with knee pain involving the  right knee. Onset of the symptoms was several months ago. Inciting event: none known. Current symptoms include crepitus sensation, giving out, pain located medially, and stiffness. Pain is aggravated by lateral movements, pivoting, rising after sitting, squatting, and walking.  Patient has had no prior knee problems. Evaluation to date: MRI: abnormal medial meniscal tear . Treatment to date: ice and rest.  Past Medical History:  Diagnosis Date   Aneurysm, splenic artery (HCC)    Cirrhosis ([place])    Colon polyps    Depression    Esophageal varices (HCC)    Gastric ulcer    Gastritis    Internal hemorrhoids    Liver cirrhosis ([place])    Migraine    Myelofibrosis ([place])    Portal hypertensive gastropathy (HCC)    PVT (portal vein thrombosis)    Thrombocytosis     Past Surgical History:  Procedure Laterality Date   BIOPSY  09/30/2021   Procedure: BIOPSY;  Surgeon: Lavena Bullion, DO;  Location: [facility];  Service: Gastroenterology;;   ESOPHAGOGASTRODUODENOSCOPY (EGD) WITH PROPOFOL N/A 09/30/2021   Procedure: ESOPHAGOGASTRODUODENOSCOPY (EGD) WITH PROPOFOL;  Surgeon: Lavena Bullion, DO;  Location: [facility];  Service: Gastroenterology;  Laterality: N/A;   FLEXIBLE SIGMOIDOSCOPY N/A 09/30/2021   Procedure: FLEXIBLE SIGMOIDOSCOPY;  Surgeon: Lavena Bullion, DO;  Location: [facility];  Service: Gastroenterology;  Laterality: N/A;   IR ANGIOGRAM SELECTIVE EACH ADDITIONAL VESSEL  09/18/2021   IR ANGIOGRAM VISCERAL SELECTIVE  09/18/2021   IR EMBO ARTERIAL NOT HEMORR HEMANG INC GUIDE ROADMAPPING  09/18/2021   IR RADIOLOGIST EVAL & MGMT  09/09/2021   IR US GUIDE VASC ACCESS RIGHT  09/18/2021   LIVER BIOPSY     RADIOLOGY WITH ANESTHESIA N/A 09/18/2021   Procedure: IR WITH ANESTHESIA SPLENIC ARTERY EMBOLIZATION;  Surgeon: Criselda Peaches, MD;  Location: [facility];  Service: Radiology;  Laterality: N/A;   TONSILLECTOMY      Prior to Admission medications   Medication Sig Start Date End Date Taking? Authorizing Provider  JAKAFI 10 MG tablet Take 10 mg by mouth 2 (two) times daily. 06/11/21  Yes [provider]  Multiple Vitamin (MULTI VITAMIN PO) Take 1 tablet by mouth daily.   Yes [provider]  ondansetron (ZOFRAN) 4 MG tablet TAKE 1 TABLET (4 MG TOTAL) BY MOUTH EVERY 6 (SIX) HOURS AS NEEDED FOR NAUSEA OR VOMITING 07/16/22  Yes Pyrtle, Lajuan Lines, MD  pantoprazole (PROTONIX) 40 MG tablet TAKE 1 TABLET BY MOUTH TWICE A DAY Patient taking differently: Take 40 mg by mouth daily. 07/16/22  Yes Pyrtle, Lajuan Lines, MD  rifaximin (XIFAXAN) 550 MG TABS tablet Take 550 mg by mouth 2 (two) times daily.   Yes [provider]   Right knee exam antalgic gait, soft tissue tenderness over medial joint line, negative drawer sign, negative McMurray sign  Physical Examination: General appearance - alert, well appearing, and in no distress Mental status - alert, oriented to person, place, and time Chest - clear to auscultation, no wheezes, rales or rhonchi, symmetric air entry Heart - normal rate, regular rhythm, normal S1, S2, no murmurs, rubs, clicks or gallops Abdomen - soft, nontender, nondistended, no masses or organomegaly Neurological - alert, oriented, normal speech, no focal findings or movement disorder noted    Asessment/Plan--- Right knee medial meniscal tear- - Plan right knee arthroscopy  with meniscal debridement. Procedure risks and potential comps discussed with patient who elects to proceed. Goals are decreased pain and increased function with a high likelihood of achieving both

## 2022-09-02 NOTE — Anesthesia Postprocedure Evaluation (Signed)
Anesthesia Post Note  Patient: Courtney Buckley  Procedure(s) Performed: Right knee arthroscopy; medial meniscal debridement (Right: Knee)     Patient location during evaluation: PACU Anesthesia Type: General Level of consciousness: awake and alert Pain management: pain level controlled Vital Signs Assessment: post-procedure vital signs reviewed and stable Respiratory status: spontaneous breathing, nonlabored ventilation and respiratory function stable Cardiovascular status: stable and blood pressure returned to baseline Anesthetic complications: no   No notable events documented.  Last Vitals:  Vitals:   09/02/22 1630 09/02/22 1645  BP: (!) 113/46 112/83  Pulse: (!) 56 (!) 56  Resp: 15 15  Temp:  36.5 C  SpO2: 100% 99%    Last Pain:  Vitals:   09/02/22 1645  TempSrc:   PainSc: [place]

## 2022-09-02 NOTE — Anesthesia Preprocedure Evaluation (Addendum)
Anesthesia Evaluation  Patient identified by MRN, date of birth, ID band Patient awake    Reviewed: Allergy & Precautions, NPO status , Patient's Chart, lab work & pertinent test results  History of Anesthesia Complications Negative for: history of anesthetic complications  Airway Mallampati: II  TM Distance: >3 FB Neck ROM: Full    Dental  (+) Missing, Dental Advisory Given   Pulmonary former smoker,    Pulmonary exam normal        Cardiovascular + Peripheral Vascular Disease  Normal cardiovascular exam   Splenic aneurysm Hx portal vein thrombosis    Neuro/Psych  Headaches, PSYCHIATRIC DISORDERS Depression    GI/Hepatic PUD, (+) Cirrhosis   Esophageal Varices    ,   Endo/Other   Obesity   Renal/GU negative Renal ROS     Musculoskeletal negative musculoskeletal ROS (+)   Abdominal   Peds  Hematology  (+) Blood dyscrasia, anemia ,  Myelofibrosis    Anesthesia Other Findings   Reproductive/Obstetrics                            Anesthesia Physical Anesthesia Plan  ASA: 3  Anesthesia Plan: General   Post-op Pain Management: Minimal or no pain anticipated   Induction: Intravenous  PONV Risk Score and Plan: 3 and Treatment may vary due to age or medical condition, Ondansetron and Dexamethasone  Airway Management Planned: LMA  Additional Equipment: None  Intra-op Plan:   Post-operative Plan: Extubation in OR  Informed Consent: I have reviewed the patients History and Physical, chart, labs and discussed the procedure including the risks, benefits and alternatives for the proposed anesthesia with the patient or authorized representative who has indicated his/her understanding and acceptance.     Dental advisory given  Plan Discussed with: CRNA and Anesthesiologist  Anesthesia Plan Comments:        Anesthesia Quick Evaluation

## 2022-09-02 NOTE — Op Note (Signed)
Operative Report- KNEE ARTHROSCOPY  Preoperative diagnosis-  Right knee medial meniscal tear  Postoperative diagnosis Right- knee medial meniscal tear  Procedure- Right knee arthroscopy with medial  meniscal debridement    Surgeon- Dione Plover. Alverta Caccamo, MD  Anesthesia-General  EBL-  Minimal  Complications- None  Condition- PACU - hemodynamically stable.  Brief clinical note- -Courtney Buckley is a 71 y.o.  female with a several month history of Right pain and mechanical symptoms. Exam and history suggested medial  meniscal tear confirmed by MRI. The patient presents now for arthroscopy and debridement   Procedure in detail -       After successful administration of General anesthetic, a tourmiquet is placed high on the Right  thigh and the Right lower extremity is prepped and draped in the usual sterile fashion. Time out is performed by the surgical team. Standard superomedial and inferolateral portal sites are marked and incisions made with an 11 blade. The inflow cannula is passed through the superomedial portal and camera through the inferolateral portal and inflow is initiated. Arthroscopic visualization proceeds.      The undersurface of the patella and trochlea are visualized and there are Grade I and II changes on both surfaces but no unstable defects. The medial and lateral gutters are visualized and there are no loose bodies. Flexion and valgus force is applied to the knee and the medial compartment is entered. A spinal needle is passed into the joint through the site marked for the inferomedial portal. A small incision is made and the dilator passed into the joint. The findings for the medial compartment are unstable tear of body of posterior horn with grade II changes scattered throughout the medial side . The tear is debrided to a stable base with baskets and a shaver and sealed off with the Arthrocare.  It is probed and found to be stable. There are no unstable chondral  defects.    The intercondylar notch is visualized and the ACL appears normal . The lateral compartment is entered and the findings are normal .      The joint is again inspected and there are no other tears, defects or loose bodies identified. The arthroscopic equipment is then removed from the inferior portals which are closed with interrupted 4-0 nylon. 20 ml of .25% Marcaine with epinephrine are injected through the inflow cannula and the cannula is then removed and the portal closed with nylon. The incisions are cleaned and dried and a bulky sterile dressing is applied. The patient is then awakened and transported to recovery in stable condition.   09/02/2022, 4:09 PM

## 2022-09-03 ENCOUNTER — Encounter (HOSPITAL_COMMUNITY): Payer: Self-pay | Admitting: Orthopedic Surgery

## 2022-09-17 DIAGNOSIS — Z8679 Personal history of other diseases of the circulatory system: Secondary | ICD-10-CM | POA: Diagnosis not present

## 2022-09-17 DIAGNOSIS — Z8669 Personal history of other diseases of the nervous system and sense organs: Secondary | ICD-10-CM | POA: Diagnosis not present

## 2022-09-17 DIAGNOSIS — K7460 Unspecified cirrhosis of liver: Secondary | ICD-10-CM | POA: Diagnosis not present

## 2022-09-17 DIAGNOSIS — Z8711 Personal history of peptic ulcer disease: Secondary | ICD-10-CM | POA: Diagnosis not present

## 2022-09-17 DIAGNOSIS — D7581 Myelofibrosis: Secondary | ICD-10-CM | POA: Diagnosis not present

## 2022-09-30 ENCOUNTER — Encounter: Payer: Self-pay | Admitting: Internal Medicine

## 2022-09-30 ENCOUNTER — Other Ambulatory Visit (INDEPENDENT_AMBULATORY_CARE_PROVIDER_SITE_OTHER): Payer: Medicare PPO

## 2022-09-30 ENCOUNTER — Ambulatory Visit: Payer: Medicare PPO | Admitting: Internal Medicine

## 2022-09-30 VITALS — BP 118/60 | HR 76 | Ht 66.0 in | Wt 194.4 lb

## 2022-09-30 DIAGNOSIS — K766 Portal hypertension: Secondary | ICD-10-CM

## 2022-09-30 DIAGNOSIS — K259 Gastric ulcer, unspecified as acute or chronic, without hemorrhage or perforation: Secondary | ICD-10-CM

## 2022-09-30 DIAGNOSIS — R112 Nausea with vomiting, unspecified: Secondary | ICD-10-CM

## 2022-09-30 DIAGNOSIS — R935 Abnormal findings on diagnostic imaging of other abdominal regions, including retroperitoneum: Secondary | ICD-10-CM | POA: Diagnosis not present

## 2022-09-30 DIAGNOSIS — Z8711 Personal history of peptic ulcer disease: Secondary | ICD-10-CM

## 2022-09-30 DIAGNOSIS — K7460 Unspecified cirrhosis of liver: Secondary | ICD-10-CM

## 2022-09-30 DIAGNOSIS — K5909 Other constipation: Secondary | ICD-10-CM | POA: Diagnosis not present

## 2022-09-30 DIAGNOSIS — R110 Nausea: Secondary | ICD-10-CM | POA: Diagnosis not present

## 2022-09-30 LAB — PROTIME-INR
INR: 1.3 ratio — ABNORMAL HIGH (ref 0.8–1.0)
Prothrombin Time: 13.6 s — ABNORMAL HIGH (ref 9.6–13.1)

## 2022-09-30 MED ORDER — PANTOPRAZOLE SODIUM 40 MG PO TBEC: 40.0000 mg | DELAYED_RELEASE_TABLET | Freq: Every day | ORAL | 3 refills | Status: DC

## 2022-09-30 MED ORDER — ONDANSETRON HCL 4 MG PO TABS: 4.0000 mg | ORAL_TABLET | Freq: Four times a day (QID) | ORAL | 1 refills | Status: DC | PRN

## 2022-09-30 MED ORDER — LACTULOSE 10 GM/15ML PO SOLN: ORAL | 0 refills | Status: DC

## 2022-09-30 NOTE — Progress Notes (Signed)
Subjective:    Patient ID: Courtney Buckley, female    DOB: Apr 27, 1951, 71 y.o.   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  She is here alone today.  She was last seen in September 2023.  She reports on the whole she is doing well.  Her biggest complaint continues to be frequent nausea that can lead to vomiting.  She reports the symptoms starts very quickly at least every other day but multiple days a week daily where she will develop nausea in the epigastrium which rolls up into her chest and leads to vomiting or at least dry heaves.  She gets a hot flushed feeling when these episodes occur.  She feels that is most likely related to rifaximin.  No heartburn on pantoprazole 40 mg a day.  No trouble swallowing.  Bowel movements are daily but often small.  She is using MiraLAX on an as-needed basis.  She is trying ondansetron for these episodes which sometimes help but never completely alleviate the symptoms.  She is using the 4 mg dose.  No recent issues with sleepiness or confusion.  No blood in stool or melena.  No easy bleeding.   Review of Systems As per HPI, otherwise negative  Current Medications, Allergies, Past Medical History, Past Surgical History, Family History and Social History were reviewed in [employer] record.    Objective:   Physical Exam Ht '5\' 6"'$  (1.676 m)   Wt 194 lb 6 oz (88.2 kg)   BMI 31.37 kg/m  Gen: awake, alert, NAD HEENT: anicteric CV: RRR, no mrg Pulm: CTA b/l Abd: soft, mildly tender in the upper and left upper abdomen without rebound or  guarding, no distention or ascites , +BS throughout Ext: no c/c/e Neuro: nonfocal, no asterixis     Latest Ref Rng & Units 08/31/2022    1:45 PM 10/02/2021   12:59 AM 10/01/2021    3:51 AM  CBC  WBC 4.0 - 10.5 K/uL 7.8  32.8  34.1   Hemoglobin 12.0 - 15.0 g/dL 11.6  8.3  8.3   Hematocrit 36.0 - 46.0 % 36.8  26.1  25.6   Platelets 150 - 400 K/uL 487  469  512    CMP     Component Value Date/Time   NA 143 08/31/2022 1345   K 4.0 08/31/2022 1345   CL 111 08/31/2022 1345   CO2 28 08/31/2022 1345   GLUCOSE 99 08/31/2022 1345   BUN 18 08/31/2022 1345   CREATININE 0.64 08/31/2022 1345   CREATININE 0.61 09/13/2020 0829   CALCIUM 9.1 08/31/2022 1345   PROT 5.5 (L) 08/31/2022 1345   ALBUMIN 3.2 (L) 08/31/2022 1345   AST 30 08/31/2022 1345   AST 34 09/13/2020 0829   ALT 16 08/31/2022 1345   ALT 15 09/13/2020 0829   ALKPHOS 96 08/31/2022 1345   BILITOT 3.0 (H) 08/31/2022 1345   BILITOT 3.3 (H) 09/13/2020 0829   GFRNONAA >60 08/31/2022 1345   GFRNONAA >60 09/13/2020 0829   Lab Results  Component Value Date   INR 1.7 (H) 09/18/2021   INR 1.5 (H) 09/03/2021   INR 1.8 (H) 07/02/2021     CTA  ABDOMEN AND PELVIS WITHOUT AND WITH CONTRAST   TECHNIQUE: Multidetector CT imaging of the abdomen and pelvis was performed using the standard protocol during bolus administration of intravenous contrast. Multiplanar reconstructed images and MIPs were obtained and reviewed to evaluate the vascular anatomy.   RADIATION DOSE REDUCTION: This exam was performed according to the departmental dose-optimization program which includes automated exposure control, adjustment of the mA and/or kV according to patient size and/or use of iterative reconstruction technique.   CONTRAST:  184m OMNIPAQUE IOHEXOL 350 MG/ML SOLN   COMPARISON:  09/28/2021, 08/26/2021, 07/02/2021, 12/27/2009   FINDINGS: VASCULAR   Aorta: Unremarkable course, caliber, contour of the abdominal aorta. No dissection,  aneurysm, or periaortic fluid.   Celiac: Celiac artery patent without significant atherosclerosis. Branches are patent.   Redemonstration of embolized splenic artery aneurysm. The significant streak artifact limits evaluation of the aneurysm sac.   SMA: Patent, with no significant atherosclerotic changes.   Renals:   - Right: Right renal artery patent.   - Left: Left renal artery patent.   IMA: Inferior mesenteric artery is patent.   Right lower extremity:   Unremarkable course, caliber, and contour of the right iliac system. No aneurysm, dissection, or occlusion. Hypogastric artery is patent. Enlarged right uterine artery contributing to the fibroid uterus. Common femoral artery patent. Proximal SFA and profunda femoris patent.   Left lower extremity:   Unremarkable course, caliber, and contour of the left iliac system. No aneurysm, dissection, or occlusion. Hypogastric artery is patent. Common femoral artery patent. Proximal SFA and profunda femoris patent.   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 SMV patent. The most central aspect of the splenic vein is patent, with the remainder not evaluated given the presence of the significant streak artifact.   Redemonstration of shunt vasculature within the left abdomen/left upper quadrant, involving portal-portal and portal-systemic collaterals. The majority of the outflow of the shunt is into the left renal vein which is significantly enlarged.   In the interval, there appears to be developing gastric varices via short gastric veins at the hilum of the spleen. No esophageal varices are visualized.   Systemic venous:   Unremarkable IVC, iliac veins, proximal femoral veins.   Pelvic veins are patent, with enlarged draining gonadal vein secondary to fibroid uterus.   Enlarged left renal vein, as  the recipient of the majority of portal collateral flow from the left upper quadrant. Unremarkable right renal vein.   Review of the MIP images confirms the above findings.   NON-VASCULAR   Lower chest: No acute.   Hepatobiliary: Right hepatic lobe hemangioma not as well visualized on the current CT as prior studies. Otherwise unremarkable liver. Partially calcified cholelithiasis. No inflammatory changes.   Pancreas: Unremarkable.   Spleen: Involving splenic infarction with estimated 50-75% of spleen parenchyma. The residual normal appearing spleen is at the superior pole. The greatest craniocaudal dimension on the parasagittal images is estimated 16.5 cm. No inflammatory changes.   Adrenals/Urinary Tract:   - Right adrenal gland: Unremarkable   - Left adrenal gland: Not well visualized secondary to streak artifact.   - Right kidney: No hydronephrosis, nephrolithiasis, inflammation, or ureteral dilation. No focal lesion.   - Left Kidney: No hydronephrosis, nephrolithiasis, inflammation, or ureteral dilation. Left kidney again is displaced caudally secondary to the chronic splenomegaly. No focal lesion.   - Urinary Bladder: Urinary bladder decompression.   Stomach/Bowel:   -  Stomach: There appears to be developing gastric varices secondary short gastric veins in the hilum of the spleen.   - Small bowel: Unremarkable   - Appendix: Normal.   - Colon: Moderate stool burden. No focal inflammatory changes, which have resolved when compared to the prior CT.   Lymphatic: No adenopathy.   Mesenteric: Interval resolution of free fluid in the pelvis. No mesenteric adenopathy.   Reproductive: Fibroid uterus.   Other: Small fat containing umbilical hernia.   Musculoskeletal: No displaced fracture. Degenerative changes of the spine. No bony canal narrowing.   IMPRESSION: No acute finding on the current CT, with interval resolution of the left colon inflammation and  reactive free fluid in the abdomen.   Redemonstration of coil embolization of splenic artery aneurysm, as well as expected changes of evolving splenic infarction.   Redemonstration of portal-systemic and portal-portal collaterals in the setting of chronic portal vein occlusion. There appears to be developing gastric varices, secondary to short gastric vein contribution in the hilum of the spleen. Correlation with endoscopy may be useful.   Fibroid uterus   Additional ancillary findings as above.   Signed,   Dulcy Fanny. Nadene Rubins, RPVI   [facility]   [facility]     Electronically Signed   By: Corrie Mckusick D.O.   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   Liver cirrhosis with history of portal hypertension/chronic portal vein thrombosis/mild hepatic encephalopathy --overall her disease has remained well compensated.  She has had mild issues with encephalopathy but this has resolved with rifaximin.  See #2 regarding nausea and rifaximin.  Up-to-date with [place] screening and variceal screening.  However, on CTA performed this summer to follow-up splenic artery coil embolization it is apparent that there may be some evolving gastric varices as result of prior splenic artery intervention.  In the setting of her nausea I think it is important to exclude significant gastric varices endoscopically -- EGD in the [place]; to evaluate for gastric varices but also nausea; we reviewed the risk, benefits and alternatives and she is agreeable and wishes to  proceed -- With nausea ongoing to put her back on lactulose but allow her to titrate this to have at least 2 soft but formed stools daily.  Will try 15 g dose once to twice daily.  We are stopping rifaximin due to nausea.  I made her aware that if she develops any confusion sleepiness that she needs to let us know immediately.  She will also let her family know so that they can help in identifying any low level mental status changes associated with encephalopathy with this medication change -- HCC screening up-to-date by CT scan in June 2023; ultrasound recommended June 2024 -- Variceal screening up-to-date from esophagus standpoint but we are proceeding with EGD to evaluate for gastric varices, -- No evidence for ascites  2.  Nausea and vomiting --she attributes this to rifaximin.  Difficult to know.  Also history of PUD.  We are repeating upper endoscopy and changing rifaximin to lactulose. -- Can continue Zofran 4 mg ODT every 6 hours as needed for now  3.  Chronic constipation --using MiraLAX as needed but now we are switching back to lactulose for constipation be an issue  4.  History of PUD --continue daily pantoprazole 40 mg  5.  Myelofibrosis --following with hematology and on Jakofi  February follow-up  40 minutes total spent today including patient facing time, coordination of care, reviewing medical history/procedures/pertinent radiology studies, and documentation of the encounter.

## 2022-09-30 NOTE — Patient Instructions (Addendum)
_______________________________________________________  If you are age 72 or older, your body mass index should be between 23-30. Your Body mass index is 31.37 kg/m. If this is out of the aforementioned range listed, please consider follow up with your Primary Care Provider.  If you are age 72 or younger, your body mass index should be between 19-25. Your Body mass index is 31.37 kg/m. If this is out of the aformentioned range listed, please consider follow up with your Primary Care Provider.   ________________________________________________________  The [facility] providers would like to encourage you to use [facility] to communicate with providers for non-urgent requests or questions.  Due to long hold times on the telephone, sending your provider a message by [facility] may be a faster and more efficient way to get a response.  Please allow 48 business hours for a response.  Please remember that this is for non-urgent requests.  _______________________________________________________  Your provider has requested that you go to the basement level for lab work before leaving today. Press "B" on the elevator. The lab is located at the first door on the left as you exit the elevator.  You have been scheduled for an endoscopy. Please follow written instructions given to you at your visit today. If you use inhalers (even only as needed), please bring them with you on the day of your procedure.  Due to recent changes in healthcare laws, you may see the results of your imaging and laboratory studies on MyChart before your provider has had a chance to review them.  We understand that in some cases there may be results that are confusing or concerning to you. Not all laboratory results come back in the same time frame and the provider may be waiting for multiple results in order to interpret others.  Please give [place] 48 hours in order for your provider to thoroughly review all the results before contacting the  office for clarification of your results.   We have sent the following medications to your pharmacy for you to pick up at your convenience:  Zofran, protonix  DISCONTINUE: Xifaxan  We have sent the following medications to your pharmacy for you to pick up at your convenience:  START: Lactulose 23m 1-2 times daily.  Can titrate up or down to achieve 2 complete bowel movements daily.  You are scheduled to follow up on 12-15-21 at 8:30am  Thank you for entrusting me with your care and [facility]  Dr PHilarie Fredrickson

## 2022-10-01 ENCOUNTER — Encounter: Payer: Self-pay | Admitting: Internal Medicine

## 2022-10-01 ENCOUNTER — Ambulatory Visit (AMBULATORY_SURGERY_CENTER): Payer: Medicare PPO | Admitting: Internal Medicine

## 2022-10-01 VITALS — BP 97/43 | HR 72 | Temp 99.6°F | Resp 20 | Ht 66.0 in | Wt 194.0 lb

## 2022-10-01 DIAGNOSIS — K7460 Unspecified cirrhosis of liver: Secondary | ICD-10-CM | POA: Diagnosis not present

## 2022-10-01 DIAGNOSIS — R933 Abnormal findings on diagnostic imaging of other parts of digestive tract: Secondary | ICD-10-CM | POA: Diagnosis not present

## 2022-10-01 DIAGNOSIS — R112 Nausea with vomiting, unspecified: Secondary | ICD-10-CM

## 2022-10-01 DIAGNOSIS — R935 Abnormal findings on diagnostic imaging of other abdominal regions, including retroperitoneum: Secondary | ICD-10-CM

## 2022-10-01 DIAGNOSIS — I8500 Esophageal varices without bleeding: Secondary | ICD-10-CM | POA: Diagnosis not present

## 2022-10-01 DIAGNOSIS — K31811 Angiodysplasia of stomach and duodenum with bleeding: Secondary | ICD-10-CM | POA: Diagnosis not present

## 2022-10-01 MED ORDER — SODIUM CHLORIDE 0.9 % IV SOLN: 4.0000 mg | Freq: Once | INTRAVENOUS | Status: DC

## 2022-10-01 MED ORDER — SODIUM CHLORIDE 0.9 % IV SOLN
4.0000 mg | Freq: Once | INTRAVENOUS | Status: AC
  Administered 2022-10-01: 4 mg via INTRAVENOUS

## 2022-10-01 MED ORDER — SODIUM CHLORIDE 0.9 % IV SOLN: 500.0000 mL | INTRAVENOUS | Status: DC

## 2022-10-01 NOTE — Progress Notes (Signed)
Report to PACU, RN, vss, BBS= Clear.  

## 2022-10-01 NOTE — Progress Notes (Signed)
Called to room to assist during endoscopic procedure.  Patient ID and intended procedure confirmed with present staff. Received instructions for my participation in the procedure from the performing physician.  

## 2022-10-01 NOTE — Progress Notes (Signed)
See office note dated yesterday for details and current H&P Patient presenting for upper endoscopy to evaluate nausea and vomiting and also evaluate for gastric varices in the setting of cirrhosis, prior splenic vein artery aneurysm with coil embolization and abnormal CTA of the abdomen

## 2022-10-01 NOTE — Patient Instructions (Signed)
   Resume previous diet.  Continue present medications.  Follow up with me in the office in Jan or Feb 2024.  Let me know if nausea improves.  Check CBC in 2 weeks.    YOU HAD AN ENDOSCOPIC PROCEDURE TODAY AT [facility]:   Refer to the procedure report that was given to you for any specific questions about what was found during the examination.  If the procedure report does not answer your questions, please call your gastroenterologist to clarify.  If you requested that your care partner not be given the details of your procedure findings, then the procedure report has been included in a sealed envelope for you to review at your convenience later.  YOU SHOULD EXPECT: Some feelings of bloating in the abdomen. Passage of more gas than usual.  Walking can help get rid of the air that was put into your GI tract during the procedure and reduce the bloating. If you had a lower endoscopy (such as a colonoscopy or flexible sigmoidoscopy) you may notice spotting of blood in your stool or on the toilet paper. If you underwent a bowel prep for your procedure, you may not have a normal bowel movement for a few days.  Please Note:  You might notice some irritation and congestion in your nose or some drainage.  This is from the oxygen used during your procedure.  There is no need for concern and it should clear up in a day or so.  SYMPTOMS TO REPORT IMMEDIATELY:   Following upper endoscopy (EGD)  Vomiting of blood or coffee ground material  New chest pain or pain under the shoulder blades  Painful or persistently difficult swallowing  New shortness of breath  Fever of 100F or higher  Black, tarry-looking stools  For urgent or emergent issues, a gastroenterologist can be reached at any hour by calling [phone]. Do not use MyChart messaging for urgent concerns.    DIET:  We do recommend a small meal at first, but then you may proceed to your regular diet.  Drink plenty of fluids but  you should avoid alcoholic beverages for 24 hours.  ACTIVITY:  You should plan to take it easy for the rest of today and you should NOT DRIVE or use heavy machinery until tomorrow (because of the sedation medicines used during the test).    FOLLOW UP: Our staff will call the number listed on your records the next business day following your procedure.  We will call around 7:15- 8:00 am to check on you and address any questions or concerns that you may have regarding the information given to you following your procedure. If we do not reach you, we will leave a message.     If any biopsies were taken you will be contacted by phone or by letter within the next 1-3 weeks.  Please call us at [phone] if you have not heard about the biopsies in 3 weeks.    SIGNATURES/CONFIDENTIALITY: You and/or your care partner have signed paperwork which will be entered into your electronic medical record.  These signatures attest to the fact that that the information above on your After Visit Summary has been reviewed and is understood.  Full responsibility of the confidentiality of this discharge information lies with you and/or your care-partner.

## 2022-10-01 NOTE — Op Note (Signed)
[facility] Patient Name: Courtney Buckley Procedure Date: 10/01/2022 10:17 AM MRN: [id number] Endoscopist: Jerene Bears , MD, [id number] Age: 71 Referring MD:  Date of Birth: 1951/05/26 Gender: Female Account #: [id number] Procedure:                Upper GI endoscopy Indications:              Suspected gastric varices (based on recent CTA                            abd), Nausea with vomiting, hx of cirrhosis with                            portal HTN, hx of gastric ulcer Medicines:                Monitored Anesthesia Care Procedure:                Pre-Anesthesia Assessment:                           - Prior to the procedure, a History and Physical                            was performed, and patient medications and                            allergies were reviewed. The patient's tolerance of                            previous anesthesia was also reviewed. The risks                            and benefits of the procedure and the sedation                            options and risks were discussed with the patient.                            All questions were answered, and informed consent                            was obtained. Prior Anticoagulants: The patient has                            taken no anticoagulant or antiplatelet agents. ASA                            Grade Assessment: III - A patient with severe                            systemic disease. After reviewing the risks and                            benefits, the patient was deemed in satisfactory  condition to undergo the procedure.                           After obtaining informed consent, the endoscope was                            passed under direct vision. Throughout the                            procedure, the patient's blood pressure, pulse, and                            oxygen saturations were monitored continuously. The                            Endoscope was  introduced through the mouth, and                            advanced to the second part of duodenum. The upper                            GI endoscopy was accomplished without difficulty.                            The patient tolerated the procedure well. Scope In: Scope Out: Findings:                 The examined esophagus was normal.                           Type 1 isolated gastric varices (IGV1, varices                            located in the fundus) with no bleeding were found                            in the gastric fundus. There were no stigmata of                            recent bleeding. They were medium in largest                            diameter.                           A single 5 mm angioectasia with bleeding was found                            in the gastric body. To stop active bleeding, two                            hemostatic clips were successfully placed (MR                            conditional).  There was no bleeding at the end of                            the maneuver.                           The exam of the stomach was otherwise normal.                           The examined duodenum was normal. Complications:            No immediate complications. Estimated Blood Loss:     Estimated blood loss: none. Impression:               - Normal esophagus.                           - Type 1 isolated gastric varices (IGV1, varices                            located in the fundus), without bleeding.                           - A single bleeding angioectasia in the stomach.                            Clips (MR conditional) were placed x 2.                           - Normal examined duodenum.                           - No specimens collected. Recommendation:           - Patient has a contact number available for                            emergencies. The signs and symptoms of potential                            delayed complications were discussed with the                             patient. Return to normal activities tomorrow.                            Written discharge instructions were provided to the                            patient.                           - Resume previous diet.                           - Continue present medications.                           -  Follow-up with me in the office in Jan or Feb                            2024.                           - Let me know if nausea fails to improve.                           - Check CBC in 2 weeks. Jerene Bears, MD 10/01/2022 10:47:43 AM This report has been signed electronically.

## 2022-10-02 ENCOUNTER — Telehealth: Payer: Self-pay

## 2022-10-02 ENCOUNTER — Other Ambulatory Visit: Payer: Self-pay

## 2022-10-02 DIAGNOSIS — K31811 Angiodysplasia of stomach and duodenum with bleeding: Secondary | ICD-10-CM

## 2022-10-02 NOTE — Telephone Encounter (Signed)
Left message on follow up call. 

## 2022-10-20 ENCOUNTER — Other Ambulatory Visit (INDEPENDENT_AMBULATORY_CARE_PROVIDER_SITE_OTHER): Payer: Medicare PPO

## 2022-10-20 ENCOUNTER — Encounter: Payer: Self-pay | Admitting: Internal Medicine

## 2022-10-20 DIAGNOSIS — K31811 Angiodysplasia of stomach and duodenum with bleeding: Secondary | ICD-10-CM | POA: Diagnosis not present

## 2022-10-20 LAB — CBC WITH DIFFERENTIAL/PLATELET
Basophils Absolute: 0.6 10*3/uL — ABNORMAL HIGH (ref 0.0–0.1)
Basophils Relative: 7.6 % — ABNORMAL HIGH (ref 0.0–3.0)
Eosinophils Absolute: 0.4 10*3/uL (ref 0.0–0.7)
Eosinophils Relative: 4.2 % (ref 0.0–5.0)
HCT: 35.7 % — ABNORMAL LOW (ref 36.0–46.0)
Hemoglobin: 12.3 g/dL (ref 12.0–15.0)
Lymphocytes Relative: 17.9 % (ref 12.0–46.0)
Lymphs Abs: 1.6 10*3/uL (ref 0.7–4.0)
MCHC: 34.5 g/dL (ref 30.0–36.0)
MCV: 79.3 fl (ref 78.0–100.0)
Monocytes Absolute: 0.5 10*3/uL (ref 0.1–1.0)
Monocytes Relative: 6 % (ref 3.0–12.0)
Neutro Abs: 5.8 10*3/uL (ref 1.4–7.7)
Neutrophils Relative %: 64.8 % (ref 43.0–77.0)
Platelets: 549 10*3/uL — ABNORMAL HIGH (ref 150.0–400.0)
RBC: 4.5 Mil/uL (ref 3.87–5.11)
RDW: 28.8 % — ABNORMAL HIGH (ref 11.5–15.5)
WBC: 9 10*3/uL (ref 4.0–10.5)

## 2022-10-29 DIAGNOSIS — M25561 Pain in right knee: Secondary | ICD-10-CM | POA: Diagnosis not present

## 2022-11-09 DIAGNOSIS — M25561 Pain in right knee: Secondary | ICD-10-CM | POA: Diagnosis not present

## 2022-11-13 IMAGING — CT CT ABD-PELV W/ CM
2 of 5 series · 16 of 46 positions shown, 18 images · IV contrast (omnipaque)
Comparison: MR abdomen 10/03/2020.

CLINICAL DATA: nonlocalized Left-sided abdominal pain, history of
myelofibrosis with spleen problems in the past, new edema in the
legs concerning for some mechanical obstruction on her IVC versus
other intra-abdominal abscess. Cirrhosis.

EXAM:
CT ABDOMEN AND PELVIS WITH CONTRAST
TECHNIQUE: Multidetector CT imaging of the abdomen and pelvis was performed
using the standard protocol following bolus administration of
intravenous contrast.
CONTRAST:  75mL OMNIPAQUE IOHEXOL 350 MG/ML SOLN

[Series 2: abd pel w · axial · 0.81mm/px · z∈[+858,+1278]mm · 13 of 96 slices shown, 15 images]
[im 6/96  soft-tissue]
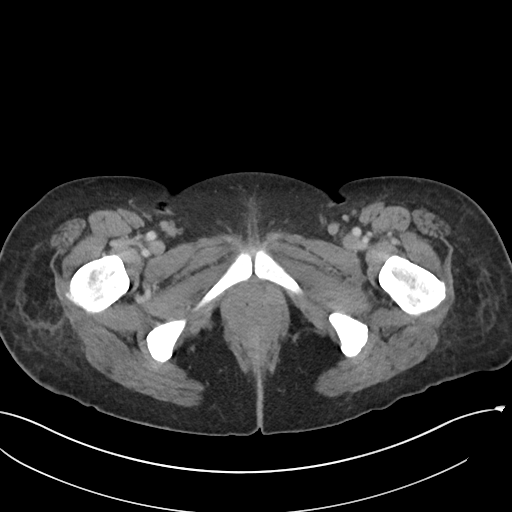
[im 6/96  bone]
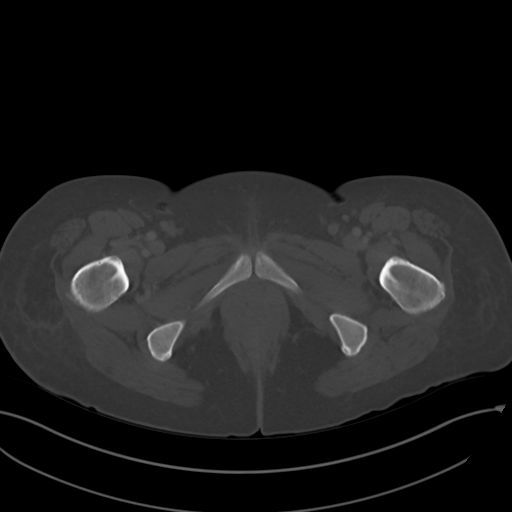
[im 11/96  soft-tissue]
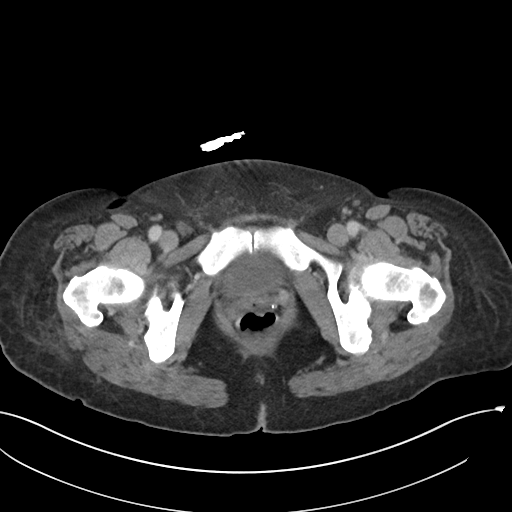
[im 22/96  soft-tissue]
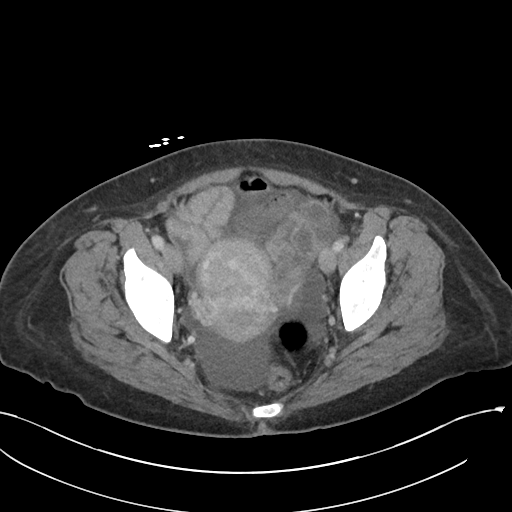
[im 27/96  soft-tissue]
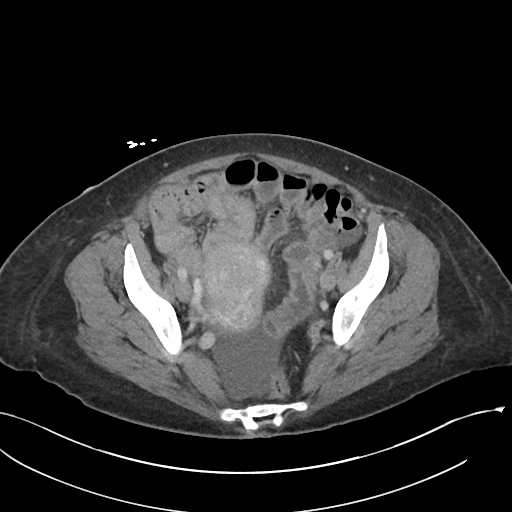
[im 32/96  soft-tissue]
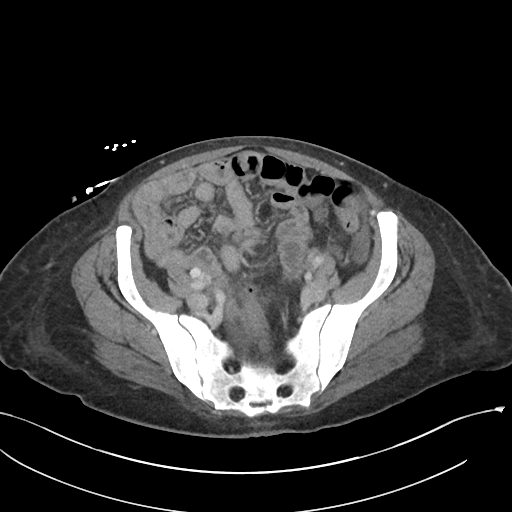
[im 43/96  soft-tissue]
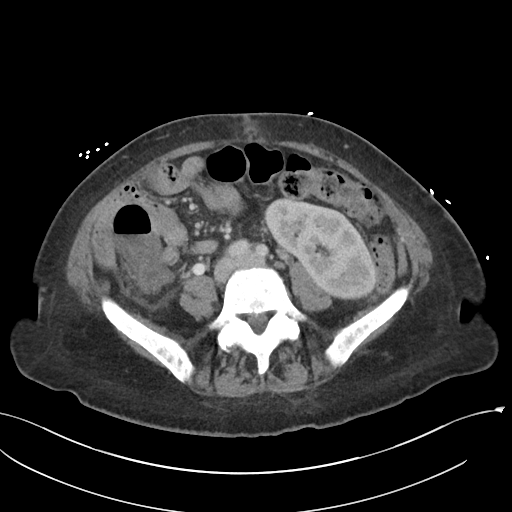
[im 48/96  soft-tissue]
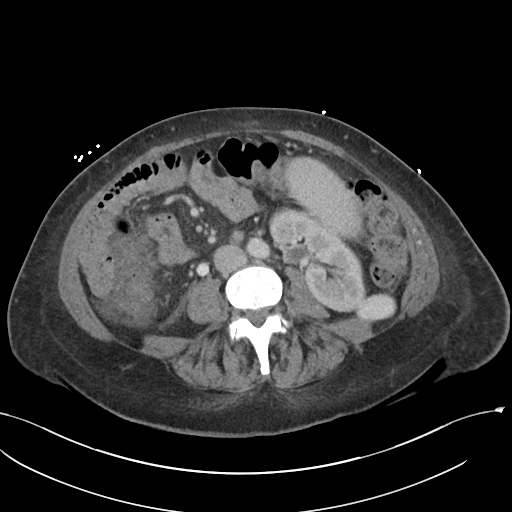
[im 53/96  soft-tissue]
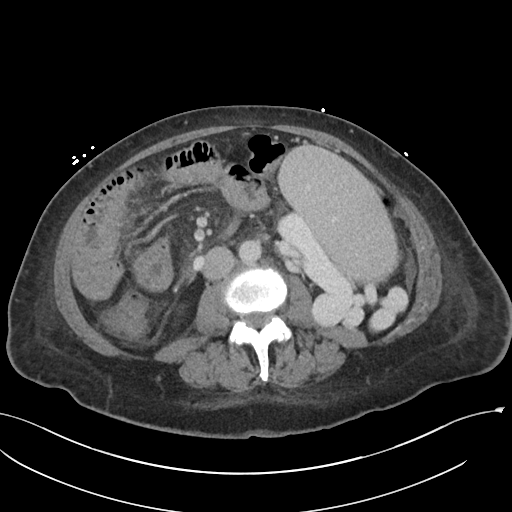
[im 64/96  soft-tissue]
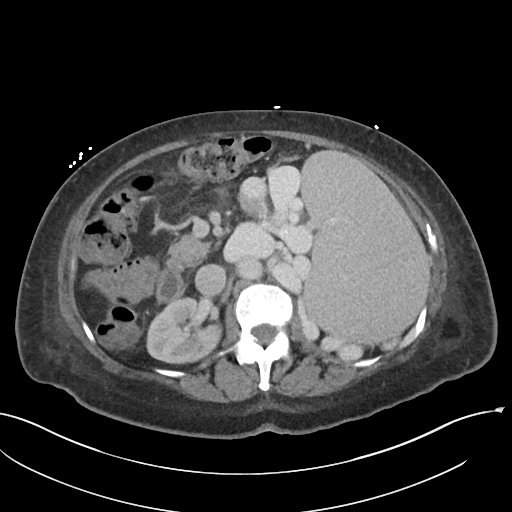
[im 64/96  bone]
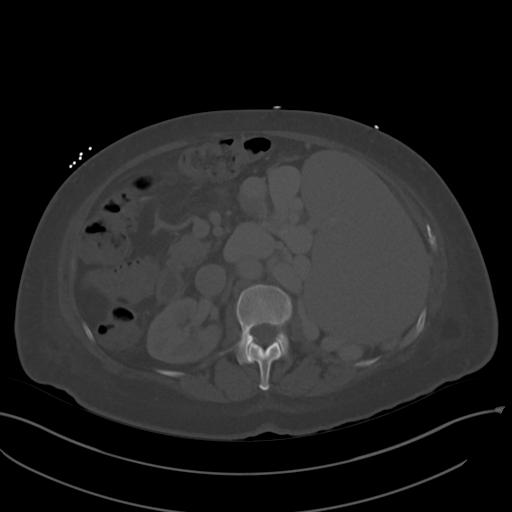
[im 69/96  soft-tissue]
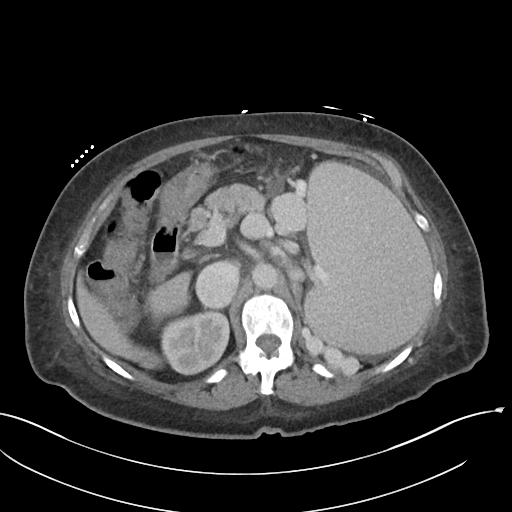
[im 74/96  soft-tissue]
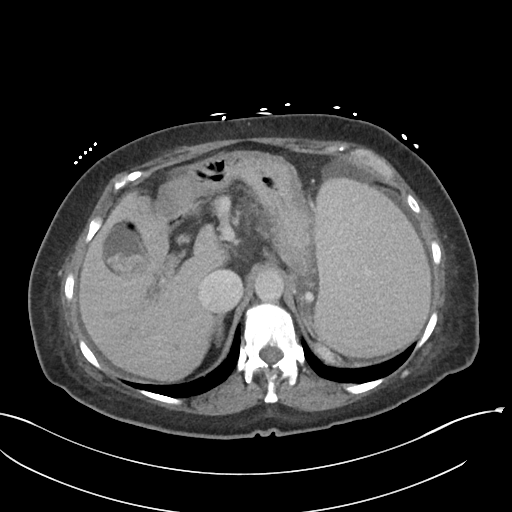
[im 85/96  soft-tissue]
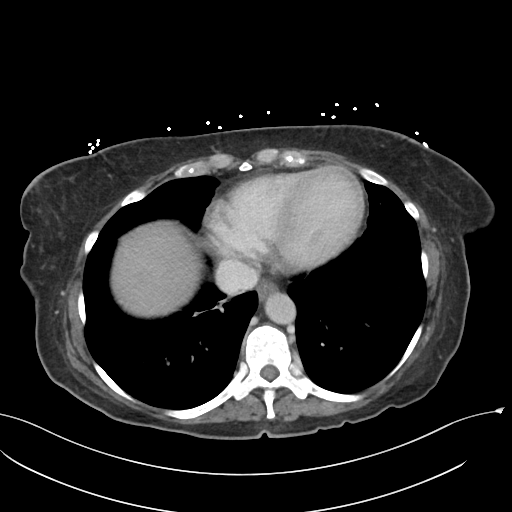
[im 90/96  soft-tissue]
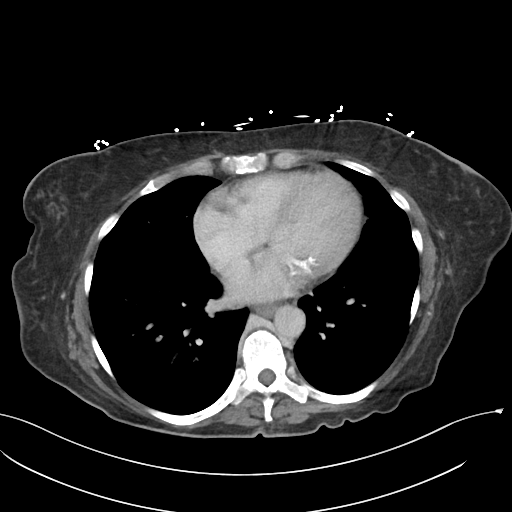

[Series 5: coronal · coronal · 0.86mm/px · 3 of 92 slices shown]
[im 31/92  soft-tissue]
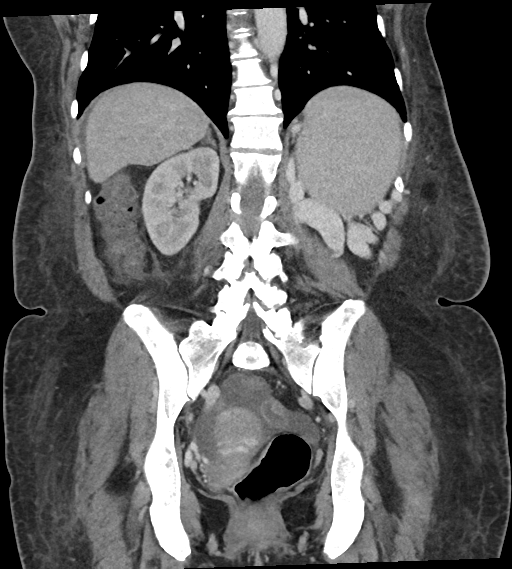
[im 41/92  soft-tissue]
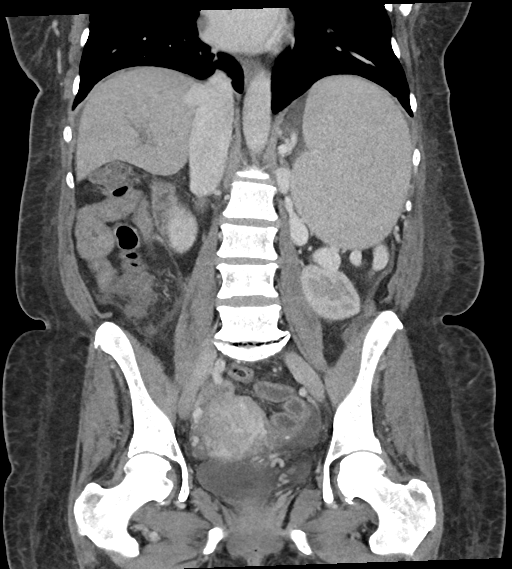
[im 51/92  soft-tissue]
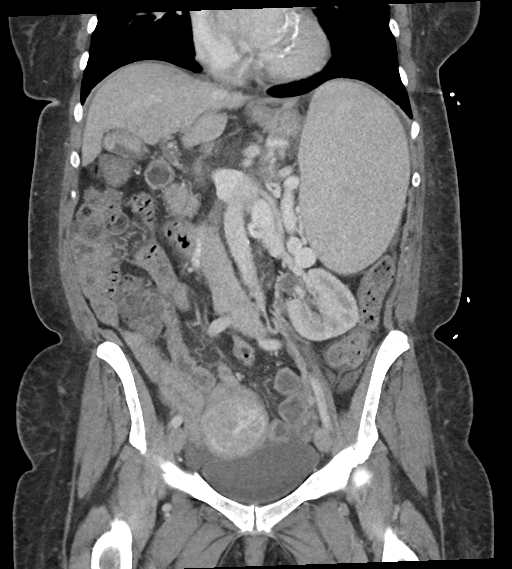

[16 of 46 positions shown; findings below may reference images not displayed]

FINDINGS: Lower chest: No acute abnormality.

Hepatobiliary: Redemonstration of a 1.3 cm right hepatic lobe
hypodense lesion which is consistent with a known hemangioma better
evaluated on MR abdomen 10/03/2020. Hyperdensity within the
gallbladder lumen likely represents gallstones. No gallbladder wall
thickening or pericholecystic fluid. No biliary dilatation.

Pancreas: No focal lesion. Normal pancreatic contour. No surrounding
inflammatory changes. No main pancreatic ductal dilatation.

Spleen: Spleen is enlarged measuring up to 17 cm. No focal splenic
lesion.

Adrenals/Urinary Tract:

No adrenal nodule bilaterally.

Bilateral kidneys enhance symmetrically.

No hydronephrosis. No hydroureter.

The urinary bladder is unremarkable.

On delayed imaging, there is no urothelial wall thickening and there
are no filling defects in the opacified portions of the bilateral
collecting systems or ureters.

Stomach/Bowel: Stomach is within normal limits. No evidence of small
bowel wall thickening or dilatation. Diffuse bowel wall thickening
and hypodense edema of the ascending colon. The appendix is not
definitely identified.

Vascular/Lymphatic: Nonvisualized main portal vein consistent with
known chronic thrombus with associated cavernous transformation of
the portal vein. Venous collaterals. Large splenorenal shunt. No
abdominal aorta or iliac aneurysm. Mild atherosclerotic plaque of
the aorta and its branches. No abdominal, pelvic, or inguinal
lymphadenopathy.

Reproductive: Uterus and bilateral adnexa are unremarkable.

Other: Trace volume simple free fluid. No intraperitoneal free gas.
No organized fluid collection.

Musculoskeletal:

No abdominal wall hernia or abnormality.

No suspicious lytic or blastic osseous lesions. No acute displaced
fracture. Multilevel degenerative changes of the spine.
IMPRESSION: 1. Cirrhosis with portal hypertension. Associated cavernous
transformation of the portal vein, large splenorenal shunt, and
right portal colopathy.
2. Trace volume simple free fluid.
3. Hyperdensity within the gallbladder lumen likely represents
gallstones or gallbladder sludge. Consider nonemergent right upper
quadrant ultrasound for further evaluation.

## 2022-12-09 DIAGNOSIS — M25561 Pain in right knee: Secondary | ICD-10-CM | POA: Diagnosis not present

## 2022-12-15 ENCOUNTER — Ambulatory Visit: Payer: Medicare PPO | Admitting: Internal Medicine

## 2022-12-15 ENCOUNTER — Encounter: Payer: Self-pay | Admitting: Internal Medicine

## 2022-12-15 ENCOUNTER — Other Ambulatory Visit (INDEPENDENT_AMBULATORY_CARE_PROVIDER_SITE_OTHER): Payer: Medicare PPO

## 2022-12-15 VITALS — BP 108/64 | HR 70 | Ht 66.0 in | Wt 197.0 lb

## 2022-12-15 DIAGNOSIS — K7460 Unspecified cirrhosis of liver: Secondary | ICD-10-CM | POA: Diagnosis not present

## 2022-12-15 DIAGNOSIS — K5909 Other constipation: Secondary | ICD-10-CM

## 2022-12-15 DIAGNOSIS — K766 Portal hypertension: Secondary | ICD-10-CM | POA: Diagnosis not present

## 2022-12-15 DIAGNOSIS — Z8711 Personal history of peptic ulcer disease: Secondary | ICD-10-CM | POA: Diagnosis not present

## 2022-12-15 DIAGNOSIS — I864 Gastric varices: Secondary | ICD-10-CM | POA: Diagnosis not present

## 2022-12-15 LAB — COMPREHENSIVE METABOLIC PANEL
ALT: 14 U/L (ref 0–35)
AST: 31 U/L (ref 0–37)
Albumin: 3.5 g/dL (ref 3.5–5.2)
Alkaline Phosphatase: 112 U/L (ref 39–117)
BUN: 14 mg/dL (ref 6–23)
CO2: 27 mEq/L (ref 19–32)
Calcium: 9.3 mg/dL (ref 8.4–10.5)
Chloride: 109 mEq/L (ref 96–112)
Creatinine, Ser: 0.57 mg/dL (ref 0.40–1.20)
GFR: 90.43 mL/min (ref >60.00)
Glucose, Bld: 115 mg/dL — ABNORMAL HIGH (ref 70–99)
Potassium: 3.7 mEq/L (ref 3.5–5.1)
Sodium: 143 mEq/L (ref 135–145)
Total Bilirubin: 2.6 mg/dL — ABNORMAL HIGH (ref 0.2–1.2)
Total Protein: 5.5 g/dL — ABNORMAL LOW (ref 6.0–8.3)

## 2022-12-15 LAB — PROTIME-INR
INR: 1.3 ratio — ABNORMAL HIGH (ref 0.8–1.0)
Prothrombin Time: 13.8 s — ABNORMAL HIGH (ref 9.6–13.1)

## 2022-12-15 LAB — CBC
HCT: 36.7 % (ref 36.0–46.0)
Hemoglobin: 12.2 g/dL (ref 12.0–15.0)
MCHC: 33.4 g/dL (ref 30.0–36.0)
MCV: 79.9 fl (ref 78.0–100.0)
Platelets: 472 10*3/uL — ABNORMAL HIGH (ref 150.0–400.0)
RBC: 4.59 Mil/uL (ref 3.87–5.11)
RDW: 29.8 % — ABNORMAL HIGH (ref 11.5–15.5)
WBC: 10.9 10*3/uL — ABNORMAL HIGH (ref 4.0–10.5)

## 2022-12-15 MED ORDER — LACTULOSE 10 GM/15ML PO SOLN: ORAL | 0 refills | Status: DC

## 2022-12-15 NOTE — Progress Notes (Signed)
Subjective:    Patient ID: Courtney Buckley, female    DOB: [birth year], 72 y.o.   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 She is here alone today and was last seen in clinic in Nov 2023 and for EGD on 10/01/22.  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  There was a 5 mm angiectasia with bleeding in the gastric body which was treated by Endo clipping x 2.  The exam was otherwise normal.  There were no esophageal varices.  On the whole she has been doing very well.  Her nausea has resolved and she only feels mild nausea for a few minutes prior to bowel movement.  No vomiting.  No abdominal pain.  No increasing abdominal girth or lower extremity edema.  She has been out of lactulose for nearly 2 weeks and switch to [facility] which she is using daily.  She has had no change in mental status or confusion.  Lactulose makes her stools a bit too loose at times while MiraLAX leads to regular formed and smooth bowel movements daily.  She has had no black stools or blood in stool.  She is feeling well energy wise.   Review of Systems As per HPI, otherwise negative  Current Medications, Allergies, Past Medical History, Past Surgical History, Family History and Social History were reviewed in [employer] record.    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  Liver cirrhosis with history of portal hypertension/chronic portal vein thrombosis/isolated gastric varices- type I/history of encephalopathy --clinically she is doing very well.  Her disease has come back in control and she is well compensated at this time.  She is not having encephalopathy and has been off lactulose for 2 weeks.  She likely does not need to use this on a daily basis at this time. -- Discontinue lactulose but I would like her to have a prescription for this and keep this at home should she develop any declining mental status symptoms.  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  We will monitor over time and discuss the possibiliy of beta blocker at followup -- CBC, CMP and INR today; will allow for meld calculation  2.  Mild constipation --we are switching to MiraLAX 17 g daily  which is currently working well for her.  We would resume lactulose if MiraLAX is ineffective or if hepatic encephalopathy recurs  3.  Nausea -- resolved.  4.  History of PUD --continue daily pantoprazole 40 mg  5.  Myelofibrosis --following with hematology and on Jakafi  19-monthfollow-up with me  30 minutes total spent today including patient facing time, coordination of care, reviewing medical  history/procedures/pertinent radiology studies, and documentation of the encounter.

## 2022-12-15 NOTE — Patient Instructions (Addendum)
Your provider has requested that you go to the basement level for lab work before leaving today. Press "B" on the elevator. The lab is located at the first door on the left as you exit the elevator.   Start Miralax - Dissolve 1 capful in at least 8 ounces of water daily.   You will need a Ultrasound in June 2024. If you have not heard from our office by the end of May, please contact office.   You will need  a follow-up in 6 month ( August 2024. ) You will contacted by the office to schedule.   _______________________________________________________  If your blood pressure at your visit was 140/90 or greater, please contact your primary care physician to follow up on this.  _______________________________________________________  If you are age 73 or older, your body mass index should be between 23-30. Your Body mass index is 31.8 kg/m. If this is out of the aforementioned range listed, please consider follow up with your Primary Care Provider.  If you are age 73 or younger, your body mass index should be between 19-25. Your Body mass index is 31.8 kg/m. If this is out of the aformentioned range listed, please consider follow up with your Primary Care Provider.   ________________________________________________________  The [facility] providers would like to encourage you to use [facility] to communicate with providers for non-urgent requests or questions.  Due to long hold times on the telephone, sending your provider a message by [facility] may be a faster and more efficient way to get a response.  Please allow 48 business hours for a response.  Please remember that this is for non-urgent requests.  _______________________________________________________  Thank you for choosing me and [facility].  Dr. Ulice Dash Pyrtle

## 2022-12-16 DIAGNOSIS — M25561 Pain in right knee: Secondary | ICD-10-CM | POA: Diagnosis not present

## 2022-12-17 DIAGNOSIS — Z8679 Personal history of other diseases of the circulatory system: Secondary | ICD-10-CM | POA: Diagnosis not present

## 2022-12-17 DIAGNOSIS — D649 Anemia, unspecified: Secondary | ICD-10-CM | POA: Diagnosis not present

## 2022-12-17 DIAGNOSIS — Z8711 Personal history of peptic ulcer disease: Secondary | ICD-10-CM | POA: Diagnosis not present

## 2022-12-17 DIAGNOSIS — I81 Portal vein thrombosis: Secondary | ICD-10-CM | POA: Diagnosis not present

## 2022-12-17 DIAGNOSIS — K259 Gastric ulcer, unspecified as acute or chronic, without hemorrhage or perforation: Secondary | ICD-10-CM | POA: Diagnosis not present

## 2022-12-17 DIAGNOSIS — D7581 Myelofibrosis: Secondary | ICD-10-CM | POA: Diagnosis not present

## 2022-12-27 ENCOUNTER — Other Ambulatory Visit: Payer: Self-pay | Admitting: Internal Medicine

## 2022-12-27 DIAGNOSIS — Z8711 Personal history of peptic ulcer disease: Secondary | ICD-10-CM

## 2023-04-08 DIAGNOSIS — F32A Depression, unspecified: Secondary | ICD-10-CM | POA: Diagnosis not present

## 2023-04-08 DIAGNOSIS — D75839 Thrombocytosis, unspecified: Secondary | ICD-10-CM | POA: Diagnosis not present

## 2023-04-08 DIAGNOSIS — Z9689 Presence of other specified functional implants: Secondary | ICD-10-CM | POA: Diagnosis not present

## 2023-04-08 DIAGNOSIS — D7581 Myelofibrosis: Secondary | ICD-10-CM | POA: Diagnosis not present

## 2023-04-26 ENCOUNTER — Other Ambulatory Visit: Payer: Self-pay

## 2023-04-26 ENCOUNTER — Telehealth: Payer: Self-pay

## 2023-04-26 DIAGNOSIS — K7460 Unspecified cirrhosis of liver: Secondary | ICD-10-CM

## 2023-04-26 DIAGNOSIS — Z789 Other specified health status: Secondary | ICD-10-CM | POA: Diagnosis not present

## 2023-04-26 DIAGNOSIS — L821 Other seborrheic keratosis: Secondary | ICD-10-CM | POA: Diagnosis not present

## 2023-04-26 DIAGNOSIS — L720 Epidermal cyst: Secondary | ICD-10-CM | POA: Diagnosis not present

## 2023-04-26 NOTE — Telephone Encounter (Signed)
You have been scheduled for an abdominal ultrasound at [facility] (1st floor of hospital) on 05/03/23 at 9:00 am . Please arrive 15 minutes prior to your appointment for registration. Make certain not to have anything to eat or drink 6 hours prior to your appointment. Should you need to reschedule your appointment, please contact radiology at [phone]. This test typically takes about 30 minutes to perform.  Patient has been scheduled for follow-up appt to see Dr Rhea Belton on 06/08/23 at 10:50 am.  Message has been left for patient with details. My chart message also sent to patient.

## 2023-05-03 ENCOUNTER — Ambulatory Visit (HOSPITAL_COMMUNITY): Payer: Medicare PPO

## 2023-05-21 ENCOUNTER — Ambulatory Visit (HOSPITAL_COMMUNITY)
Admission: RE | Admit: 2023-05-21 | Discharge: 2023-05-21 | Disposition: A | Discharge status: Home / Self Care | Payer: Medicare PPO | Source: Ambulatory Visit | Attending: Internal Medicine | Admitting: Internal Medicine

## 2023-05-21 DIAGNOSIS — K7460 Unspecified cirrhosis of liver: Secondary | ICD-10-CM | POA: Diagnosis not present

## 2023-05-21 DIAGNOSIS — K8020 Calculus of gallbladder without cholecystitis without obstruction: Secondary | ICD-10-CM | POA: Diagnosis not present

## 2023-05-27 ENCOUNTER — Other Ambulatory Visit (HOSPITAL_COMMUNITY): Payer: Medicare PPO

## 2023-06-08 ENCOUNTER — Ambulatory Visit: Payer: Medicare PPO | Admitting: Internal Medicine

## 2023-06-08 ENCOUNTER — Encounter: Payer: Self-pay | Admitting: Internal Medicine

## 2023-06-08 VITALS — BP 110/60 | HR 61 | Ht 66.0 in | Wt 197.0 lb

## 2023-06-08 DIAGNOSIS — D7581 Myelofibrosis: Secondary | ICD-10-CM

## 2023-06-08 DIAGNOSIS — K5909 Other constipation: Secondary | ICD-10-CM | POA: Diagnosis not present

## 2023-06-08 DIAGNOSIS — Z8711 Personal history of peptic ulcer disease: Secondary | ICD-10-CM | POA: Diagnosis not present

## 2023-06-08 DIAGNOSIS — K7460 Unspecified cirrhosis of liver: Secondary | ICD-10-CM | POA: Diagnosis not present

## 2023-06-08 DIAGNOSIS — K766 Portal hypertension: Secondary | ICD-10-CM

## 2023-06-08 DIAGNOSIS — I864 Gastric varices: Secondary | ICD-10-CM | POA: Diagnosis not present

## 2023-06-08 DIAGNOSIS — K31811 Angiodysplasia of stomach and duodenum with bleeding: Secondary | ICD-10-CM | POA: Diagnosis not present

## 2023-06-08 NOTE — Patient Instructions (Signed)
Continue pantoprazole 40 mg daily and Miralax daily.   Dr. Rhea Belton would like to see you in the office in January. We do not have a schedule at this time. Please contact our office in a few weeks to check when January schedule becomes available.   _______________________________________________________  If your blood pressure at your visit was 140/90 or greater, please contact your primary care physician to follow up on this.  _______________________________________________________  If you are age 73 or older, your body mass index should be between 23-30. Your Body mass index is 31.8 kg/m. If this is out of the aforementioned range listed, please consider follow up with your Primary Care Provider.  If you are age 74 or younger, your body mass index should be between 19-25. Your Body mass index is 31.8 kg/m. If this is out of the aformentioned range listed, please consider follow up with your Primary Care Provider.   ________________________________________________________  The [facility] providers would like to encourage you to use [facility] to communicate with providers for non-urgent requests or questions.  Due to long hold times on the telephone, sending your provider a message by [facility] may be a faster and more efficient way to get a response.  Please allow 48 business hours for a response.  Please remember that this is for non-urgent requests.  _______________________________________________________

## 2023-06-08 NOTE — Progress Notes (Signed)
Subjective:    Patient ID: Courtney Buckley, female    DOB: 11/15/1950, 72 y.o.   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  I last saw her on 12/15/2022 and she is here alone today.  She reports she is feeling well.  She continues the pantoprazole 40 mg daily.  She is also using MiraLAX 17 g every other day.  She still has some very minor nausea but this is overall better.  This is always just before bowel movement and resolves immediately afterwards.  No vomiting.  She has not felt constipated and is not having diarrhea.  No blood in stool or melena.  No confusion, abdominal or lower extremity edema.  No daytime sleepiness.  Some fatigue.  She continues on Jakafi for her myelofibrosis.   Review of Systems As per HPI, otherwise negative  Current Medications, Allergies, Past Medical History, Past Surgical History, Family History and Social History were reviewed in [employer] record.    Objective:   Physical Exam BP 110/60   Pulse 61   Ht 5\' 6"  (1.676 m)   Wt 197 lb (89.4 kg)   BMI 31.80 kg/m  Gen: awake, alert, NAD HEENT: Mildly icteric sclera CV: Soft SEM, regular rate and rhythm Pulm: Clear bilaterally Abd: soft, NT/ND, +BS throughout Ext: no c/c/e Neuro: nonfocal  Care everywhere lab reviewed from 04/08/2023 including liver function panel and anemia profile.  Iron studies were normal. Last hemoglobin 12.2  ULTRASOUND ABDOMEN LIMITED RIGHT UPPER QUADRANT   COMPARISON:  April 10, 2020   FINDINGS: Gallbladder:   Gallstones are noted, largest  measures 1.3 cm. No wall thickening visualized. No sonographic Murphy sign noted by sonographer.   Common bile duct:   Diameter: 3.3 mm   Liver:   No focal lesion identified. Within normal limits in parenchymal echogenicity. Portal vein is patent on color Doppler imaging with normal direction of blood flow towards the liver.   Other: None.   IMPRESSION: Cholelithiasis without sonographic evidence of acute cholecystitis. No focal liver lesion.     Electronically Signed   By: Sherian Rein M.D.   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   Cirrhosis with history of portal hypertension, chronic portal vein thrombosis, isolated gastric varices-type I/prior encephalopathy --clinically she is doing very well and her disease remains compensated at this time.  No further encephalopathy.  We have had her off lactulose without event.  She knows certainly to monitor for any daytime sleepiness or foggy thinking. 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  Also data is limited for the prevention of variceal hemorrhage and isolated gastric varices.;  Repeat EGD recommended October 2025 -- If not already done CBC, CMP and  INR follow-up -- Remain off lactulose for now  2.  History of gastric angioectasia --continue daily PPI with pantoprazole 40 mg  3.  Mild constipation and associated nausea --symptoms under good control with MiraLAX 17 g daily to every other day -- Continue MiraLAX 17 g daily to every other day, titrate as needed  4.  History of PUD --continuing daily PPI  5.  Myelofibrosis --following closely with hematology and on Jakafi  6.  Colon cancer screening --inflammatory polyp only a colonoscopy in February 2020; repeat would not be indicated until February 2030 at which point we would need to decide risk-benefit based on age.  74-month follow-up, sooner if needed

## 2023-06-14 ENCOUNTER — Ambulatory Visit (INDEPENDENT_AMBULATORY_CARE_PROVIDER_SITE_OTHER): Payer: Medicare PPO

## 2023-06-14 VITALS — Wt 197.0 lb

## 2023-06-14 DIAGNOSIS — Z0000 Encounter for general adult medical examination without abnormal findings: Secondary | ICD-10-CM | POA: Diagnosis not present

## 2023-06-14 NOTE — Progress Notes (Addendum)
Subjective:   Courtney Buckley is a 72 y.o. female who presents for Medicare Annual (Subsequent) preventive examination.  Visit Complete: Virtual  I connected with  Chapman Fitch on 06/14/23 by a audio enabled telemedicine application and verified that I am speaking with the correct person using two identifiers.  Patient Location: Home  Provider Location: Office/Clinic  I discussed the limitations of evaluation and management by telemedicine. The patient expressed understanding and agreed to proceed.    Vital Signs: Unable to obtain new vitals due to this being a telehealth visit.  Review of Systems     Cardiac Risk Factors include: advanced age (>27men, >34 women)     Objective:    Today's Vitals   06/14/23 1030  Weight: 197 lb (89.4 kg)   Body mass index is 31.8 kg/m.     06/14/2023   10:36 AM 08/31/2022    1:29 PM 06/08/2022   10:48 AM 11/24/2021    2:03 PM 09/29/2021   10:14 PM 09/18/2021   11:24 AM 08/27/2021   10:42 PM  Advanced Directives  Does Patient Have a Medical Advance Directive? Yes Yes Yes No Yes Yes No  Type of Estate agent of [place];Living will  Healthcare Power of [employer] Power of [employer] Power of Attorney   Does patient want to make changes to medical advance directive?  No - Patient declined   No - Patient declined No - Patient declined   Copy of Healthcare Power of Attorney in Chart? No - copy requested  No - copy requested  No - copy requested No - copy requested   Would patient like information on creating a medical advance directive?    No - Patient declined No - Patient declined  No - Patient declined    Current Medications (verified) Outpatient Encounter Medications as of 06/14/2023  Medication Sig   JAKAFI 10 MG tablet Take 10 mg by mouth 2 (two) times daily.   Multiple Vitamin (MULTI VITAMIN PO) Take 1 tablet by mouth daily.   pantoprazole (PROTONIX) 40 MG tablet TAKE 1 TABLET BY  MOUTH EVERY DAY   polyethylene glycol (MIRALAX / GLYCOLAX) 17 g packet Take 17 g by mouth daily.   No facility-administered encounter medications on file as of 06/14/2023.    Allergies (verified) Patient has no known allergies.   History: Past Medical History:  Diagnosis Date   Aneurysm, splenic artery (HCC)    Cirrhosis (HCC)    Colon polyps    Depression    Esophageal varices (HCC)    Gastric ulcer    Gastritis    Internal hemorrhoids    Liver cirrhosis (HCC)    Migraine    Myelofibrosis (HCC)    Portal hypertensive gastropathy (HCC)    PVT (portal vein thrombosis)    Thrombocytosis    Past Surgical History:  Procedure Laterality Date   BIOPSY  09/30/2021   Procedure: BIOPSY;  Surgeon: Shellia Cleverly, DO;  Location: [facility];  Service: Gastroenterology;;   ESOPHAGOGASTRODUODENOSCOPY (EGD) WITH PROPOFOL N/A 09/30/2021   Procedure: ESOPHAGOGASTRODUODENOSCOPY (EGD) WITH PROPOFOL;  Surgeon: Shellia Cleverly, DO;  Location: [facility];  Service: Gastroenterology;  Laterality: N/A;   FLEXIBLE SIGMOIDOSCOPY N/A 09/30/2021   Procedure: FLEXIBLE SIGMOIDOSCOPY;  Surgeon: Shellia Cleverly, DO;  Location: [facility];  Service: Gastroenterology;  Laterality: N/A;   IR ANGIOGRAM SELECTIVE EACH ADDITIONAL VESSEL  09/18/2021   IR ANGIOGRAM VISCERAL SELECTIVE  09/18/2021   IR EMBO ARTERIAL NOT HEMORR HEMANG INC  GUIDE ROADMAPPING  09/18/2021   IR RADIOLOGIST EVAL & MGMT  09/09/2021   IR US GUIDE VASC ACCESS RIGHT  09/18/2021   KNEE ARTHROSCOPY Right 09/02/2022   Procedure: Right knee arthroscopy; medial meniscal debridement;  Surgeon: Ollen Gross, MD;  Location: [facility];  Service: Orthopedics;  Laterality: Right;   LIVER BIOPSY     RADIOLOGY WITH ANESTHESIA N/A 09/18/2021   Procedure: IR WITH ANESTHESIA SPLENIC ARTERY EMBOLIZATION;  Surgeon: Sterling Big, MD;  Location: [facility] OR;  Service: Radiology;  Laterality: N/A;   TONSILLECTOMY     Family History  Problem  Relation Age of Onset   Cancer - Lung Father    Diabetes Mellitus II Maternal Aunt    Stomach cancer Maternal Aunt    Diabetes Mellitus II Maternal Uncle    Colon cancer Neg Hx    Rectal cancer Neg Hx    Esophageal cancer Neg Hx    Liver disease Neg Hx    Pancreatic cancer Neg Hx    Social History   Socioeconomic History   Marital status: Significant Other    Spouse name: Not on file   Number of children: 2   Years of education: Not on file   Highest education level: Not on file  Occupational History   Not on file  Tobacco Use   Smoking status: Former    Types: Cigarettes   Smokeless tobacco: Never  Vaping Use   Vaping status: Never Used  Substance and Sexual Activity   Alcohol use: Not Currently    Comment: none now,   Drug use: No   Sexual activity: Not Currently  Other Topics Concern   Not on file  Social History Narrative   Not on file   Social Determinants of Health   Financial Resource Strain: Low Risk  (06/14/2023)   Overall Financial Resource Strain (CARDIA)    Difficulty of Paying Living Expenses: Not hard at all  Food Insecurity: No Food Insecurity (06/14/2023)   Hunger Vital Sign    Worried About Running Out of Food in the Last Year: Never true    Ran Out of Food in the Last Year: Never true  Transportation Needs: No Transportation Needs (06/14/2023)   PRAPARE - Administrator, Civil Service (Medical): No    Lack of Transportation (Non-Medical): No  Physical Activity: Inactive (06/14/2023)   Exercise Vital Sign    Days of Exercise per Week: 0 days    Minutes of Exercise per Session: 0 min  Stress: No Stress Concern Present (06/14/2023)   [employer] of Occupational Health - Occupational Stress Questionnaire    Feeling of Stress : Not at all  Social Connections: Moderately Isolated (06/14/2023)   Social Connection and Isolation Panel [NHANES]    Frequency of Communication with Friends and Family: More than three times a week     Frequency of Social Gatherings with Friends and Family: More than three times a week    Attends Religious Services: Never    Database administrator or Organizations: No    Attends Engineer, structural: Never    Marital Status: Married    Tobacco Counseling Counseling given: Not Answered   Clinical Intake:  Pre-visit preparation completed: Yes  Pain : No/denies pain     BMI - recorded: 31.8 Nutritional Status: BMI > 30  Obese Nutritional Risks: None Diabetes: No  How often do you need to have someone help you when you read instructions, pamphlets, or other written materials  from your doctor or pharmacy?: 1 - Never  Interpreter Needed?: No  Information entered by :: Lanier Ensign, LPN   Activities of Daily Living    06/14/2023   10:32 AM 08/31/2022    1:33 PM  In your present state of health, do you have any difficulty performing the following activities:  Hearing? 1   Comment will have hearing testd   Vision? 0   Difficulty concentrating or making decisions? 0   Walking or climbing stairs? 0   Dressing or bathing? 0   Doing errands, shopping? 0 0  Preparing Food and eating ? N   Using the Toilet? N   In the past six months, have you accidently leaked urine? N   Do you have problems with loss of bowel control? N   Managing your Medications? N   Managing your Finances? N   Housekeeping or managing your Housekeeping? N     Patient Care Team: Allwardt, Crist Infante, PA-C as PCP - General (Physician Assistant)  Indicate any recent Medical Services you may have received from other than [facility] providers in the past year (date may be approximate).     Assessment:   This is a routine wellness examination for Courtney Buckley.  Hearing/Vision screen Hearing Screening - Comments:: Courtney Buckley will have a hearing test  Vision Screening - Comments:: Courtney Buckley follows up with eye provider for annual eye exams   Dietary issues and exercise activities discussed:     Goals Addressed              This Visit's Progress    Patient Stated       Lose 10 lbs        Depression Screen    06/14/2023   10:35 AM 06/08/2022   10:47 AM 05/25/2022   10:42 AM 05/26/2021   11:19 AM  PHQ 2/9 Scores  PHQ - 2 Score 0 0 0 0  PHQ- 9 Score   2     Fall Risk    06/14/2023   10:38 AM 06/08/2022   10:49 AM 07/02/2021   12:59 PM 05/26/2021   11:21 AM 12/27/2020   12:59 PM  Fall Risk   Falls in the past year? 1 0 0 1 0  Number falls in past yr: 1 0  1 0  Injury with Fall? 1 0  1 0  Comment left foot and left hand    Just bruising  Risk for fall due to : History of fall(s);Impaired balance/gait;Impaired vision Impaired vision;Impaired balance/gait  Impaired vision;Impaired mobility;Impaired balance/gait   Follow up Falls prevention discussed Falls prevention discussed  Falls prevention discussed     MEDICARE RISK AT HOME:  Medicare Risk at Home - 06/14/23 1032     Any stairs in or around the home? No    If so, are there any without handrails? No    Home free of loose throw rugs in walkways, pet beds, electrical cords, etc? Yes    Adequate lighting in your home to reduce risk of falls? Yes    Life alert? No    Use of a cane, walker or w/c? No    Grab bars in the bathroom? Yes    Shower chair or bench in shower? No    Elevated toilet seat or a handicapped toilet? No             TIMED UP AND GO:  Was the test performed?  No    Cognitive Function:  06/14/2023   10:38 AM 06/08/2022   10:52 AM 05/26/2021   11:23 AM  6CIT Screen  What Year? 0 points 0 points 0 points  What month? 0 points 0 points 0 points  What time? 0 points 0 points 0 points  Count back from 20 0 points 0 points 0 points  Months in reverse 0 points 0 points 0 points  Repeat phrase 0 points 4 points 0 points  Total Score 0 points 4 points 0 points    Immunizations Immunization History  Administered Date(s) Administered   Covid-19, Mrna,Vaccine(Spikevax)79yrs and older 01/11/2023   Fluad  Quad(high Dose 65+) 07/25/2020, 07/04/2022   Hepatitis A, Ped/Adol-2 Dose 05/07/2010   Hepatitis B, ADULT 05/07/2010   Hepb-cpg 06/02/2019, 07/27/2019   Influenza Split 09/03/2015, 08/18/2016, 07/15/2017, 08/10/2019   Influenza, High Dose Seasonal PF 08/05/2018, 08/10/2019   PFIZER Comirnaty(Gray Top)Covid-19 Tri-Sucrose Vaccine 02/05/2021   PFIZER(Purple Top)SARS-COV-2 Vaccination 12/15/2019, 01/09/2020, 07/30/2020   PNEUMOCOCCAL CONJUGATE-20 05/25/2022   Pneumococcal Conjugate-13 11/13/2015   Pneumococcal Polysaccharide-23 05/09/2012   Respiratory Syncytial Virus Vaccine,Recomb Aduvanted(Arexvy) 07/04/2022   Tdap 08/27/2008   Zoster Recombinant(Shingrix) 08/10/2019, 05/04/2020   Zoster, Live 08/10/2019    TDAP status: Due, Education has been provided regarding the importance of this vaccine. Advised may receive this vaccine at local pharmacy or Health Dept. Aware to provide a copy of the vaccination record if obtained from local pharmacy or Health Dept. Verbalized acceptance and understanding.  Flu Vaccine status: Due, Education has been provided regarding the importance of this vaccine. Advised may receive this vaccine at local pharmacy or Health Dept. Aware to provide a copy of the vaccination record if obtained from local pharmacy or Health Dept. Verbalized acceptance and understanding.  Pneumococcal vaccine status: Up to date  Covid-19 vaccine status: Completed vaccines  Qualifies for Shingles Vaccine? Yes   Zostavax completed Yes   Shingrix Completed?: Yes  Screening Tests Health Maintenance  Topic Date Due   DTaP/Tdap/Td (2 - Td or Tdap) 08/27/2018   COVID-19 Vaccine (6 - 2023-24 season) 03/08/2023   INFLUENZA VACCINE  06/03/2023   MAMMOGRAM  08/08/2023   Medicare Annual Wellness (AWV)  06/13/2024   Pneumonia Vaccine 44+ Years old  Completed   DEXA SCAN  Completed   Hepatitis C Screening  Completed   Zoster Vaccines- Shingrix  Completed   HPV VACCINES  Aged Out     Health Maintenance  Health Maintenance Due  Topic Date Due   DTaP/Tdap/Td (2 - Td or Tdap) 08/27/2018   COVID-19 Vaccine (6 - 2023-24 season) 03/08/2023   INFLUENZA VACCINE  06/03/2023    Colorectal cancer screening: Type of screening: Colonoscopy. Completed 12/23/18. Repeat every 5 years  Mammogram status: Completed 08/07/21. Repeat every year  Bone Density status: Completed 12/12/21. Results reflect: Bone density results: OSTEOPOROSIS. Repeat every 2 years.  Additional Screening:  Hepatitis C Screening:  qualify; Completed 12/07/18  Vision Screening: Recommended annual ophthalmology exams for early detection of glaucoma and other disorders of the eye. Is the patient up to date with their annual eye exam?  Yes  Who is the provider or what is the name of the office in which the patient attends annual eye exams? Provider annually  If Courtney Buckley is not established with a provider, would they like to be referred to a provider to establish care? No .   Dental Screening: Recommended annual dental exams for proper oral hygiene    Community Resource Referral / Chronic Care Management: CRR required this visit?  No  CCM required this visit?  No     Plan:     I have personally reviewed and noted the following in the patient's chart:   Medical and social history Use of alcohol, tobacco or illicit drugs  Current medications and supplements including opioid prescriptions. Patient is not currently taking opioid prescriptions. Functional ability and status Nutritional status Physical activity Advanced directives List of other physicians Hospitalizations, surgeries, and ER visits in previous 12 months Vitals Screenings to include cognitive, depression, and falls Referrals and appointments  In addition, I have reviewed and discussed with patient certain preventive protocols, quality metrics, and best practice recommendations. A written personalized care plan for preventive services as  well as general preventive health recommendations were provided to patient.     Marzella Schlein, LPN   5/73/2202   After Visit Summary: (MyChart) Due to this being a telephonic visit, the after visit summary with patients personalized plan was offered to patient via MyChart   Nurse Notes: none

## 2023-06-14 NOTE — Patient Instructions (Signed)
Courtney Buckley , Thank you for taking time to come for your Medicare Wellness Visit. I appreciate your ongoing commitment to your health goals. Please review the following plan we discussed and let me know if I can assist you in the future.   Referrals/Orders/Follow-Ups/Clinician Recommendations: lose 10 lbs   This is a list of the screening recommended for you and due dates:  Health Maintenance  Topic Date Due   DTaP/Tdap/Td vaccine (2 - Td or Tdap) 08/27/2018   COVID-19 Vaccine (6 - 2023-24 season) 03/08/2023   Flu Shot  06/03/2023   Mammogram  08/08/2023   Medicare Annual Wellness Visit  06/13/2024   Pneumonia Vaccine  Completed   DEXA scan (bone density measurement)  Completed   Hepatitis C Screening  Completed   Zoster (Shingles) Vaccine  Completed   HPV Vaccine  Aged Out    Advanced directives: (Copy Requested) Please bring a copy of your health care power of attorney and living will to the office to be added to your chart at your convenience.  Next Medicare Annual Wellness Visit scheduled for next year: Yes  Preventive Care 45 Years and Older, Female Preventive care refers to lifestyle choices and visits with your health care provider that can promote health and wellness. What does preventive care include? A yearly physical exam. This is also called an annual well check. Dental exams once or twice a year. Routine eye exams. Ask your health care provider how often you should have your eyes checked. Personal lifestyle choices, including: Daily care of your teeth and gums. Regular physical activity. Eating a healthy diet. Avoiding tobacco and drug use. Limiting alcohol use. Practicing safe sex. Taking low-dose aspirin every day. Taking vitamin and mineral supplements as recommended by your health care provider. What happens during an annual well check? The services and screenings done by your health care provider during your annual well check will depend on your age,  overall health, lifestyle risk factors, and family history of disease. Counseling  Your health care provider may ask you questions about your: Alcohol use. Tobacco use. Drug use. Emotional well-being. Home and relationship well-being. Sexual activity. Eating habits. History of falls. Memory and ability to understand (cognition). Work and work Astronomer. Reproductive health. Screening  You may have the following tests or measurements: Height, weight, and BMI. Blood pressure. Lipid and cholesterol levels. These may be checked every 5 years, or more frequently if you are over 17 years old. Skin check. Lung cancer screening. You may have this screening every year starting at age 46 if you have a 30-pack-year history of smoking and currently smoke or have quit within the past 15 years. Fecal occult blood test (FOBT) of the stool. You may have this test every year starting at age 5. Flexible sigmoidoscopy or colonoscopy. You may have a sigmoidoscopy every 5 years or a colonoscopy every 10 years starting at age 44. Hepatitis C blood test. Hepatitis B blood test. Sexually transmitted disease (STD) testing. Diabetes screening. This is done by checking your blood sugar (glucose) after you have not eaten for a while (fasting). You may have this done every 1-3 years. Bone density scan. This is done to screen for osteoporosis. You may have this done starting at age 54. Mammogram. This may be done every 1-2 years. Talk to your health care provider about how often you should have regular mammograms. Talk with your health care provider about your test results, treatment options, and if necessary, the need for more tests. Vaccines  Your health care provider may recommend certain vaccines, such as: Influenza vaccine. This is recommended every year. Tetanus, diphtheria, and acellular pertussis (Tdap, Td) vaccine. You may need a Td booster every 10 years. Zoster vaccine. You may need this after age  78. Pneumococcal 13-valent conjugate (PCV13) vaccine. One dose is recommended after age 17. Pneumococcal polysaccharide (PPSV23) vaccine. One dose is recommended after age 30. Talk to your health care provider about which screenings and vaccines you need and how often you need them. This information is not intended to replace advice given to you by your health care provider. Make sure you discuss any questions you have with your health care provider. Document Released: 11/15/2015 Document Revised: 07/08/2016 Document Reviewed: 08/20/2015 Elsevier Interactive Patient Education  2017 [employer].  Fall Prevention in the Home Falls can cause injuries. They can happen to people of all ages. There are many things you can do to make your home safe and to help prevent falls. What can I do on the outside of my home? Regularly fix the edges of walkways and driveways and fix any cracks. Remove anything that might make you trip as you walk through a door, such as a raised step or threshold. Trim any bushes or trees on the path to your home. Use bright outdoor lighting. Clear any walking paths of anything that might make someone trip, such as rocks or tools. Regularly check to see if handrails are loose or broken. Make sure that both sides of any steps have handrails. Any raised decks and porches should have guardrails on the edges. Have any leaves, snow, or ice cleared regularly. Use sand or salt on walking paths during winter. Clean up any spills in your garage right away. This includes oil or grease spills. What can I do in the bathroom? Use night lights. Install grab bars by the toilet and in the tub and shower. Do not use towel bars as grab bars. Use non-skid mats or decals in the tub or shower. If you need to sit down in the shower, use a plastic, non-slip stool. Keep the floor dry. Clean up any water that spills on the floor as soon as it happens. Remove soap buildup in the tub or shower  regularly. Attach bath mats securely with double-sided non-slip rug tape. Do not have throw rugs and other things on the floor that can make you trip. What can I do in the bedroom? Use night lights. Make sure that you have a light by your bed that is easy to reach. Do not use any sheets or blankets that are too big for your bed. They should not hang down onto the floor. Have a firm chair that has side arms. You can use this for support while you get dressed. Do not have throw rugs and other things on the floor that can make you trip. What can I do in the kitchen? Clean up any spills right away. Avoid walking on wet floors. Keep items that you use a lot in easy-to-reach places. If you need to reach something above you, use a strong step stool that has a grab bar. Keep electrical cords out of the way. Do not use floor polish or wax that makes floors slippery. If you must use wax, use non-skid floor wax. Do not have throw rugs and other things on the floor that can make you trip. What can I do with my stairs? Do not leave any items on the stairs. Make sure that there are  handrails on both sides of the stairs and use them. Fix handrails that are broken or loose. Make sure that handrails are as long as the stairways. Check any carpeting to make sure that it is firmly attached to the stairs. Fix any carpet that is loose or worn. Avoid having throw rugs at the top or bottom of the stairs. If you do have throw rugs, attach them to the floor with carpet tape. Make sure that you have a light switch at the top of the stairs and the bottom of the stairs. If you do not have them, ask someone to add them for you. What else can I do to help prevent falls? Wear shoes that: Do not have high heels. Have rubber bottoms. Are comfortable and fit you well. Are closed at the toe. Do not wear sandals. If you use a stepladder: Make sure that it is fully opened. Do not climb a closed stepladder. Make sure that  both sides of the stepladder are locked into place. Ask someone to hold it for you, if possible. Clearly mark and make sure that you can see: Any grab bars or handrails. First and last steps. Where the edge of each step is. Use tools that help you move around (mobility aids) if they are needed. These include: Canes. Walkers. Scooters. Crutches. Turn on the lights when you go into a dark area. Replace any light bulbs as soon as they burn out. Set up your furniture so you have a clear path. Avoid moving your furniture around. If any of your floors are uneven, fix them. If there are any pets around you, be aware of where they are. Review your medicines with your doctor. Some medicines can make you feel dizzy. This can increase your chance of falling. Ask your doctor what other things that you can do to help prevent falls. This information is not intended to replace advice given to you by your health care provider. Make sure you discuss any questions you have with your health care provider. Document Released: 08/15/2009 Document Revised: 03/26/2016 Document Reviewed: 11/23/2014 Elsevier Interactive Patient Education  2017 [employer].

## 2023-06-28 ENCOUNTER — Ambulatory Visit: Payer: Medicare PPO | Admitting: Internal Medicine

## 2023-06-30 ENCOUNTER — Ambulatory Visit: Payer: Medicare PPO | Admitting: Internal Medicine

## 2023-08-02 DIAGNOSIS — K766 Portal hypertension: Secondary | ICD-10-CM | POA: Diagnosis not present

## 2023-08-02 DIAGNOSIS — Z8679 Personal history of other diseases of the circulatory system: Secondary | ICD-10-CM | POA: Diagnosis not present

## 2023-08-02 DIAGNOSIS — I071 Rheumatic tricuspid insufficiency: Secondary | ICD-10-CM | POA: Diagnosis not present

## 2023-08-02 DIAGNOSIS — K7469 Other cirrhosis of liver: Secondary | ICD-10-CM | POA: Diagnosis not present

## 2023-08-02 DIAGNOSIS — Z8711 Personal history of peptic ulcer disease: Secondary | ICD-10-CM | POA: Diagnosis not present

## 2023-08-02 DIAGNOSIS — D471 Chronic myeloproliferative disease: Secondary | ICD-10-CM | POA: Diagnosis not present

## 2023-08-02 DIAGNOSIS — R413 Other amnesia: Secondary | ICD-10-CM | POA: Diagnosis not present

## 2023-08-02 DIAGNOSIS — I8500 Esophageal varices without bleeding: Secondary | ICD-10-CM | POA: Diagnosis not present

## 2023-08-02 DIAGNOSIS — D7581 Myelofibrosis: Secondary | ICD-10-CM | POA: Diagnosis not present

## 2023-08-02 DIAGNOSIS — I517 Cardiomegaly: Secondary | ICD-10-CM | POA: Diagnosis not present

## 2023-08-02 DIAGNOSIS — K7460 Unspecified cirrhosis of liver: Secondary | ICD-10-CM | POA: Diagnosis not present

## 2023-08-02 DIAGNOSIS — R110 Nausea: Secondary | ICD-10-CM | POA: Diagnosis not present

## 2023-08-09 ENCOUNTER — Ambulatory Visit: Payer: Medicare PPO | Admitting: Physician Assistant

## 2023-08-09 VITALS — BP 102/66 | HR 72 | Temp 97.5°F | Ht 66.0 in | Wt 195.2 lb

## 2023-08-09 DIAGNOSIS — F325 Major depressive disorder, single episode, in full remission: Secondary | ICD-10-CM | POA: Insufficient documentation

## 2023-08-09 DIAGNOSIS — R011 Cardiac murmur, unspecified: Secondary | ICD-10-CM

## 2023-08-09 DIAGNOSIS — D509 Iron deficiency anemia, unspecified: Secondary | ICD-10-CM

## 2023-08-09 DIAGNOSIS — G43109 Migraine with aura, not intractable, without status migrainosus: Secondary | ICD-10-CM

## 2023-08-09 DIAGNOSIS — K30 Functional dyspepsia: Secondary | ICD-10-CM | POA: Insufficient documentation

## 2023-08-09 DIAGNOSIS — B029 Zoster without complications: Secondary | ICD-10-CM | POA: Diagnosis not present

## 2023-08-09 DIAGNOSIS — Z6833 Body mass index (BMI) 33.0-33.9, adult: Secondary | ICD-10-CM | POA: Insufficient documentation

## 2023-08-09 HISTORY — DX: Migraine with aura, not intractable, without status migrainosus: G43.109

## 2023-08-09 HISTORY — DX: Other disorders of bilirubin metabolism: E80.6

## 2023-08-09 HISTORY — DX: Cardiac murmur, unspecified: R01.1

## 2023-08-09 HISTORY — DX: Functional dyspepsia: K30

## 2023-08-09 HISTORY — DX: Iron deficiency anemia, unspecified: D50.9

## 2023-08-09 HISTORY — DX: Body mass index (BMI) 33.0-33.9, adult: Z68.33

## 2023-08-09 HISTORY — DX: Major depressive disorder, single episode, in full remission: F32.5

## 2023-08-09 MED ORDER — VALACYCLOVIR HCL 1 G PO TABS: 1000.0000 mg | ORAL_TABLET | Freq: Three times a day (TID) | ORAL | 0 refills | Status: DC

## 2023-08-09 MED ORDER — GABAPENTIN 300 MG PO CAPS
300.0000 mg | ORAL_CAPSULE | Freq: Two times a day (BID) | ORAL | 0 refills | Status: DC
Start: 2023-08-09 — End: 2023-09-08

## 2023-08-09 NOTE — Progress Notes (Signed)
Subjective:    Patient ID: Courtney Buckley, female    DOB: 1951/05/03, 72 y.o.   MRN: [id number]  Chief Complaint  Patient presents with   Rash    Pt in office c/o rash on LUE from pinky up to shoulder; pt says rash is painful, looks like Shingles. Pt is vaccinated for Shingles; rash been present since Thursday and spreading since then.    Rash    Discussed the use of AI scribe software for clinical note transcription with the patient, who gave verbal consent to proceed.  History of Present Illness   Courtney Buckley, a patient with a history of receiving the Shingrix vaccine, presents with a rash that started five days ago on her left arm. The rash has since spread to other areas, including her fingers. The rash is described as painful, with a "zinging" sensation that is severe enough to wake her up. She initially thought it was a spider bite due to the sudden onset and the localized nature of the rash. She denies any recent illness or increased stress prior to the onset of the rash. She has not had shingles before and has received the Shingrix vaccine.       Past Medical History:  Diagnosis Date   Aneurysm, splenic artery (HCC)    Cirrhosis (HCC)    Colon polyps    Depression    Esophageal varices (HCC)    Gastric ulcer    Gastritis    Internal hemorrhoids    Liver cirrhosis (HCC)    Migraine    Myelofibrosis (HCC)    Portal hypertensive gastropathy (HCC)    PVT (portal vein thrombosis)    Thrombocytosis     Past Surgical History:  Procedure Laterality Date   BIOPSY  09/30/2021   Procedure: BIOPSY;  Surgeon: Shellia Cleverly, DO;  [facility];  Service: Gastroenterology;;   ESOPHAGOGASTRODUODENOSCOPY (EGD) WITH PROPOFOL N/A 09/30/2021   Procedure: ESOPHAGOGASTRODUODENOSCOPY (EGD) WITH PROPOFOL;  Surgeon: Shellia Cleverly, DO;  Location: [facility];  Service: Gastroenterology;  Laterality: N/A;   FLEXIBLE SIGMOIDOSCOPY N/A 09/30/2021   Procedure: FLEXIBLE  SIGMOIDOSCOPY;  Surgeon: Shellia Cleverly, DO;  Location: [facility];  Service: Gastroenterology;  Laterality: N/A;   IR ANGIOGRAM SELECTIVE EACH ADDITIONAL VESSEL  09/18/2021   IR ANGIOGRAM VISCERAL SELECTIVE  09/18/2021   IR EMBO ARTERIAL NOT HEMORR HEMANG INC GUIDE ROADMAPPING  09/18/2021   IR RADIOLOGIST EVAL & MGMT  09/09/2021   IR US GUIDE VASC ACCESS RIGHT  09/18/2021   KNEE ARTHROSCOPY Right 09/02/2022   Procedure: Right knee arthroscopy; medial meniscal debridement;  Surgeon: Ollen Gross, MD;  Location: [facility];  Service: Orthopedics;  Laterality: Right;   LIVER BIOPSY     RADIOLOGY WITH ANESTHESIA N/A 09/18/2021   Procedure: IR WITH ANESTHESIA SPLENIC ARTERY EMBOLIZATION;  Surgeon: Sterling Big, MD;  Location: [facility] OR;  Service: Radiology;  Laterality: N/A;   TONSILLECTOMY      Family History  Problem Relation Age of Onset   Cancer - Lung Father    Diabetes Mellitus II Maternal Aunt    Stomach cancer Maternal Aunt    Diabetes Mellitus II Maternal Uncle    Colon cancer Neg Hx    Rectal cancer Neg Hx    Esophageal cancer Neg Hx    Liver disease Neg Hx    Pancreatic cancer Neg Hx     Social History   Tobacco Use   Smoking status: Former    Types: Cigarettes   Smokeless  tobacco: Never  Vaping Use   Vaping status: Never Used  Substance Use Topics   Alcohol use: Not Currently    Comment: none now,   Drug use: No     No Known Allergies  Review of Systems  Skin:  Positive for rash.   NEGATIVE UNLESS OTHERWISE INDICATED IN HPI      Objective:     BP 102/66 (BP Location: Right Arm)   Pulse 72   Temp (!) 97.5 F (36.4 C) (Temporal)   Ht 5\' 6"  (1.676 m)   Wt 195 lb 3.2 oz (88.5 kg)   SpO2 97%   BMI 31.51 kg/m   Wt Readings from Last 3 Encounters:  08/09/23 195 lb 3.2 oz (88.5 kg)  06/14/23 197 lb (89.4 kg)  06/08/23 197 lb (89.4 kg)    BP Readings from Last 3 Encounters:  08/09/23 102/66  06/08/23 110/60  12/15/22 108/64     Physical  Exam Vitals and nursing note reviewed.  Constitutional:      Appearance: Normal appearance.  Cardiovascular:     Rate and Rhythm: Normal rate and regular rhythm.  Pulmonary:     Effort: Pulmonary effort is normal.     Breath sounds: Normal breath sounds.  Skin:    Findings: Rash (left arm several patches of erythema with clear clustered vesicles) present.  Neurological:     Mental Status: She is alert.        Assessment & Plan:  Herpes zoster without complication -     valACYclovir HCl; Take 1 tablet (1,000 mg total) by mouth 3 (three) times daily. For 7 days for shingles.  Dispense: 21 tablet; Refill: 0 -     Gabapentin; Take 1 capsule (300 mg total) by mouth 2 (two) times daily for 10 days.  Dispense: 20 capsule; Refill: 0   Assessment and Plan    Herpes Zoster (Shingles) New onset rash on left arm with zinging pain, started 5 days ago. Patient has received Shingrix vaccine series in 2021. No complications noted. -Prescribe Valtrex 1g TID for 7 days to reduce viral load and potentially hasten recovery. -Prescribe Gabapentin 300mg  at bedtime (can increase to BID if tolerated and needed) for neuropathic pain. -Advise patient to wear long sleeves to prevent transmission, especially around pregnant women or unvaccinated children. -Consider prescribing Prednisone later in the week if pain increases or becomes more bothersome. -Follow-up as needed.          Return if symptoms worsen or fail to improve.   Gwendelyn Lanting M Gennavieve Huq, PA-C

## 2023-08-24 ENCOUNTER — Telehealth: Payer: Self-pay | Admitting: Physician Assistant

## 2023-08-24 NOTE — Telephone Encounter (Signed)
Patient states she forgot to ask PCP for neurology referral for her feet at last OV on 10/07. Please Advise.

## 2023-08-25 NOTE — Telephone Encounter (Signed)
Please see pt message regarding referral and advise if ok to place referral

## 2023-08-25 NOTE — Telephone Encounter (Signed)
Returned pt call and lvm needing more information as to why the referral was needed for diagnosis purposes. Advised cb number or sending Mychart message with information to PCP.

## 2023-09-01 NOTE — Telephone Encounter (Signed)
Pt returned call stating she needs a podiatry referral not Neurology; pt has extreme foot pain and not able to hardly walk. Pt scheduled for 09/07/23 @ 11am

## 2023-09-01 NOTE — Telephone Encounter (Signed)
Noted and agreed, thank you. 

## 2023-09-07 ENCOUNTER — Ambulatory Visit: Payer: Medicare PPO | Admitting: Physician Assistant

## 2023-09-08 ENCOUNTER — Ambulatory Visit: Payer: Medicare PPO | Admitting: Family

## 2023-09-08 ENCOUNTER — Encounter: Payer: Self-pay | Admitting: Family

## 2023-09-08 VITALS — BP 103/65 | HR 79 | Temp 98.0°F | Ht 66.0 in | Wt 189.2 lb

## 2023-09-08 DIAGNOSIS — H6123 Impacted cerumen, bilateral: Secondary | ICD-10-CM | POA: Diagnosis not present

## 2023-09-08 DIAGNOSIS — R413 Other amnesia: Secondary | ICD-10-CM

## 2023-09-08 DIAGNOSIS — M79675 Pain in left toe(s): Secondary | ICD-10-CM

## 2023-09-08 DIAGNOSIS — R110 Nausea: Secondary | ICD-10-CM | POA: Diagnosis not present

## 2023-09-08 DIAGNOSIS — J209 Acute bronchitis, unspecified: Secondary | ICD-10-CM | POA: Diagnosis not present

## 2023-09-08 MED ORDER — PREDNISONE 20 MG PO TABS: ORAL_TABLET | ORAL | 0 refills | Status: DC

## 2023-09-08 MED ORDER — AZITHROMYCIN 250 MG PO TABS
ORAL_TABLET | ORAL | 0 refills | Status: AC
Start: 2023-09-08 — End: 2023-09-13

## 2023-09-08 MED ORDER — PROMETHAZINE HCL 12.5 MG PO TABS
12.5000 mg | ORAL_TABLET | Freq: Four times a day (QID) | ORAL | 0 refills | Status: DC | PRN
Start: 2023-09-08 — End: 2024-06-20

## 2023-09-08 MED ORDER — GUAIFENESIN-CODEINE 100-10 MG/5ML PO SYRP
5.0000 mL | ORAL_SOLUTION | Freq: Three times a day (TID) | ORAL | 0 refills | Status: DC | PRN
Start: 2023-09-08 — End: 2023-10-25

## 2023-09-08 NOTE — Progress Notes (Deleted)
Patient ID: Courtney Buckley, female    DOB: Jun 22, 1951, 72 y.o.   MRN: [id number]  No chief complaint on file.   Assessment & Plan:     Subjective:    Outpatient Medications Prior to Visit  Medication Sig Dispense Refill   gabapentin (NEURONTIN) 300 MG capsule Take 1 capsule (300 mg total) by mouth 2 (two) times daily for 10 days. 20 capsule 0   JAKAFI 10 MG tablet Take 10 mg by mouth 2 (two) times daily.     Multiple Vitamin (MULTI VITAMIN PO) Take 1 tablet by mouth daily.     pantoprazole (PROTONIX) 40 MG tablet TAKE 1 TABLET BY MOUTH EVERY DAY (Patient not taking: Reported on 08/09/2023) 90 tablet 0   polyethylene glycol (MIRALAX / GLYCOLAX) 17 g packet Take 17 g by mouth daily.     promethazine (PHENERGAN) 12.5 MG tablet Take 12.5 mg by mouth every 6 (six) hours as needed for nausea.     valACYclovir (VALTREX) 1000 MG tablet Take 1 tablet (1,000 mg total) by mouth 3 (three) times daily. For 7 days for shingles. 21 tablet 0   No facility-administered medications prior to visit.   Past Medical History:  Diagnosis Date   Aneurysm, splenic artery (HCC)    Cirrhosis (HCC)    Colon polyps    Depression    Esophageal varices (HCC)    Gastric ulcer    Gastritis    Internal hemorrhoids    Liver cirrhosis (HCC)    Migraine    Myelofibrosis (HCC)    Portal hypertensive gastropathy (HCC)    PVT (portal vein thrombosis)    Thrombocytosis    Past Surgical History:  Procedure Laterality Date   BIOPSY  09/30/2021   Procedure: BIOPSY;  Surgeon: Shellia Cleverly, DO;  [facility];  Service: Gastroenterology;;   ESOPHAGOGASTRODUODENOSCOPY (EGD) WITH PROPOFOL N/A 09/30/2021   Procedure: ESOPHAGOGASTRODUODENOSCOPY (EGD) WITH PROPOFOL;  Surgeon: Shellia Cleverly, DO;  [facility];  Service: Gastroenterology;  Laterality: N/A;   FLEXIBLE SIGMOIDOSCOPY N/A 09/30/2021   Procedure: FLEXIBLE SIGMOIDOSCOPY;  Surgeon: Shellia Cleverly, DO;  [facility];  Service: Gastroenterology;  Laterality: N/A;   IR ANGIOGRAM SELECTIVE EACH ADDITIONAL VESSEL  09/18/2021   IR ANGIOGRAM VISCERAL SELECTIVE  09/18/2021   IR EMBO ARTERIAL NOT HEMORR HEMANG INC GUIDE ROADMAPPING  09/18/2021   IR RADIOLOGIST EVAL & MGMT  09/09/2021   IR US GUIDE VASC ACCESS RIGHT  09/18/2021   KNEE ARTHROSCOPY Right 09/02/2022   Procedure: Right knee arthroscopy; medial meniscal debridement;  Surgeon: Ollen Gross, MD;  [facility]: [facility];  Service: Orthopedics;  Laterality: Right;   LIVER BIOPSY     RADIOLOGY WITH ANESTHESIA N/A 09/18/2021   Procedure: IR WITH ANESTHESIA SPLENIC ARTERY EMBOLIZATION;  Surgeon: Sterling Big, MD;  Location: [facility] OR;  Service: Radiology;  Laterality: N/A;   TONSILLECTOMY     No Known Allergies    Objective:    Physical Exam Vitals and nursing note reviewed.  Constitutional:      Appearance: Normal appearance.  Cardiovascular:     Rate and Rhythm: Normal rate and regular rhythm.  Pulmonary:     Effort: Pulmonary effort is normal.     Breath sounds: Normal breath sounds.  Musculoskeletal:        General: Normal range of motion.  Skin:    General: Skin is warm and dry.  Neurological:     Mental Status: She is alert.  Psychiatric:  Mood and Affect: Mood normal.        Behavior: Behavior normal.    There were no vitals taken for this visit. Wt Readings from Last 3 Encounters:  08/09/23 195 lb 3.2 oz (88.5 kg)  06/14/23 197 lb (89.4 kg)  06/08/23 197 lb (89.4 kg)       Dulce Sellar, NP

## 2023-09-08 NOTE — Patient Instructions (Addendum)
It was very nice to see you today!   I have sent over 4 medicines to help with your cough, follow the directions on the bottles. Drink more water, shooting for 8, 8oz cups per day. I have also sent over a refill of Promethazine for your nausea.  I am sending referrals to Neurology and Podiatry. They will call you to schedule appointments.  Let us know if your cough is still not better by next week.       PLEASE NOTE:  If you had any lab tests please let us know if you have not heard back within a few days. You may see your results on MyChart before we have a chance to review them but we will give you a call once they are reviewed by [place]. If we ordered any referrals today, please let us know if you have not heard from their office within the next week.

## 2023-09-08 NOTE — Progress Notes (Addendum)
Patient ID: Courtney Buckley, female    DOB: 1950/11/05, 72 y.o.   MRN: [id number]  Chief Complaint  Patient presents with   Fall    Pt c/o Fall on May 12, 2023 and broke her left foot, Foot is painful , numb and unable to put certain shoes on.    Cough    Pt c/o Cough and SOB for about a month.  Has tried robitussin and ibuprofen which does not help.    Memory Loss   HPI: The patient, with a history of bone marrow cancer, presents with a swollen and painful left foot following a fall in July. The patient reports that the foot was initially broken, but has healed. However, the patient's great toe remains swollen and painful, preventing her from wearing shoes. The patient describes the toe as feeling squishy and tender to touch, and it hurts when she is applying pressure with occasional numbness & tingling. The patient also reports numbness in the toes, particularly at night, and has fallen three to four times in the past year due to a lack of sensation in her feet.  In addition to the foot issue, the patient has been experiencing a persistent cough for the past 3-4 weeks. The cough is accompanied by a low-grade fever, headache, and a runny nose. The patient reports that the cough is worse at night, causing sleep disturbances. The patient has been managing the cough with Robitussin and ibuprofen at home. The cough is disruptive to sleep and has been unresponsive to over-the-counter cough suppressants. She is cg to her grandchild and can't rest. She has not taken any sinus or allergy meds. She denies any hx of Asthma or bronchitis that she can remember.  The patient also reports memory problems, particularly with short-term memory. The patient describes difficulty moving from one room to another and forgetting what she was doing. She states her long term memory is still good. The patient's significant other and cancer doctor have recommended seeing a neurologist for these memory  issues.  Assessment & Plan:  Acute bronchitis, unspecified organism - lungs with mild rhonchi in upper lobes, sx >3w, sending Zpack, Prednisone pack, cough syrup, advised on use & SE of all meds. Increase water intake to 2L qd, reduce juice and soda. RTO precautions provided.  -     Azithromycin; Take 2 tablets on day 1, then 1 tablet daily on days 2 through 5  Dispense: 6 tablet; Refill: 0 -     predniSONE; Take 2 pills in the morning with breakfast for 3 days, then 1 pill for 2 days  Dispense: 8 tablet; Refill: 0 -     guaiFENesin-Codeine; Take 5 mLs by mouth 3 (three) times daily as needed for cough (May cause drowsiness).  Dispense: 120 mL; Refill: 0  Great toe pain, left - -     Ambulatory referral to Podiatry  Bilateral impacted cerumen - Verbal consent received to perform bilateral ear lavage via Hydrogen peroxide/water mix solution. Curette used to remove visible cerumen. Pt tolerated well, complete evacuation of all cerumen obtained. Mild erythema but no bleeding noted in ear canals after procedure.  Nausea in adult - -     Promethazine HCl; Take 1 tablet (12.5 mg total) by mouth every 6 (six) hours as needed for nausea (May cause drowsiness.).  Dispense: 30 tablet; Refill: 0  Memory changes - -     Ambulatory referral to Neurology   Subjective:    Outpatient Medications Prior to Visit  Medication Sig Dispense Refill   JAKAFI 10 MG tablet Take 10 mg by mouth 2 (two) times daily.     Multiple Vitamin (MULTI VITAMIN PO) Take 1 tablet by mouth daily.     pantoprazole (PROTONIX) 40 MG tablet TAKE 1 TABLET BY MOUTH EVERY DAY 90 tablet 0   polyethylene glycol (MIRALAX / GLYCOLAX) 17 g packet Take 17 g by mouth daily.     promethazine (PHENERGAN) 12.5 MG tablet Take 12.5 mg by mouth every 6 (six) hours as needed for nausea.     gabapentin (NEURONTIN) 300 MG capsule Take 1 capsule (300 mg total) by mouth 2 (two) times daily for 10 days. 20 capsule 0   valACYclovir (VALTREX) 1000 MG  tablet Take 1 tablet (1,000 mg total) by mouth 3 (three) times daily. For 7 days for shingles. 21 tablet 0   No facility-administered medications prior to visit.   Past Medical History:  Diagnosis Date   Aneurysm, splenic artery (HCC)    Cirrhosis (HCC)    Colon polyps    Depression    Esophageal varices (HCC)    Gastric ulcer    Gastritis    Internal hemorrhoids    Liver cirrhosis (HCC)    Migraine    Myelofibrosis (HCC)    Portal hypertensive gastropathy (HCC)    PVT (portal vein thrombosis)    Thrombocytosis    Past Surgical History:  Procedure Laterality Date   BIOPSY  09/30/2021   Procedure: BIOPSY;  Surgeon: Shellia Cleverly, DO;  [facility]: [facility];  Service: Gastroenterology;;   ESOPHAGOGASTRODUODENOSCOPY (EGD) WITH PROPOFOL N/A 09/30/2021   Procedure: ESOPHAGOGASTRODUODENOSCOPY (EGD) WITH PROPOFOL;  Surgeon: Shellia Cleverly, DO;  [facility]: [facility];  Service: Gastroenterology;  Laterality: N/A;   FLEXIBLE SIGMOIDOSCOPY N/A 09/30/2021   Procedure: FLEXIBLE SIGMOIDOSCOPY;  Surgeon: Shellia Cleverly, DO;  [facility]: [facility];  Service: Gastroenterology;  Laterality: N/A;   IR ANGIOGRAM SELECTIVE EACH ADDITIONAL VESSEL  09/18/2021   IR ANGIOGRAM VISCERAL SELECTIVE  09/18/2021   IR EMBO ARTERIAL NOT HEMORR HEMANG INC GUIDE ROADMAPPING  09/18/2021   IR RADIOLOGIST EVAL & MGMT  09/09/2021   IR US GUIDE VASC ACCESS RIGHT  09/18/2021   KNEE ARTHROSCOPY Right 09/02/2022   Procedure: Right knee arthroscopy; medial meniscal debridement;  Surgeon: Ollen Gross, MD;  Location: [facility];  Service: Orthopedics;  Laterality: Right;   LIVER BIOPSY     RADIOLOGY WITH ANESTHESIA N/A 09/18/2021   Procedure: IR WITH ANESTHESIA SPLENIC ARTERY EMBOLIZATION;  Surgeon: Sterling Big, MD;  Location[facility];  Service: Radiology;  Laterality: N/A;   TONSILLECTOMY     No Known Allergies    Objective:    Physical Exam Vitals and nursing note reviewed.  Constitutional:       Appearance: Normal appearance. She is ill-appearing.     Interventions: Face mask in place.  HENT:     Right Ear: There is impacted cerumen.     Left Ear: There is impacted cerumen.     Nose:     Right Sinus: Frontal sinus tenderness present.     Left Sinus: Frontal sinus tenderness present.     Mouth/Throat:     Mouth: Mucous membranes are moist.     Pharynx: Posterior oropharyngeal erythema present. No pharyngeal swelling, oropharyngeal exudate or uvula swelling.     Tonsils: No tonsillar exudate or tonsillar abscesses.  Cardiovascular:     Rate and Rhythm: Normal rate and regular rhythm.  Pulmonary:     Effort:  Pulmonary effort is normal.     Breath sounds: Examination of the right-upper field reveals rhonchi. Examination of the left-upper field reveals rhonchi. Rhonchi present.  Musculoskeletal:     Left foot: Decreased range of motion.  Feet:     Left foot:     Skin integrity: Skin integrity normal. No erythema or warmth.     Comments: Mild swelling noted in left lateral ankle and left great toe. Lymphadenopathy:     Head:     Right side of head: No preauricular or posterior auricular adenopathy.     Left side of head: No preauricular or posterior auricular adenopathy.     Cervical: No cervical adenopathy.  Skin:    General: Skin is warm and dry.  Neurological:     Mental Status: She is alert.  Psychiatric:        Mood and Affect: Mood normal.        Behavior: Behavior normal.    BP 103/65 (BP Location: Left Arm, Patient Position: Sitting, Cuff Size: Normal)   Pulse 79   Temp 98 F (36.7 C) (Temporal)   Ht 5\' 6"  (1.676 m)   Wt 189 lb 4 oz (85.8 kg)   SpO2 97%   BMI 30.55 kg/m  Wt Readings from Last 3 Encounters:  09/08/23 189 lb 4 oz (85.8 kg)  08/09/23 195 lb 3.2 oz (88.5 kg)  06/14/23 197 lb (89.4 kg)      Dulce Sellar, NP

## 2023-09-09 ENCOUNTER — Ambulatory Visit: Payer: Medicare PPO | Admitting: Podiatry

## 2023-09-09 ENCOUNTER — Ambulatory Visit (INDEPENDENT_AMBULATORY_CARE_PROVIDER_SITE_OTHER): Payer: Medicare PPO

## 2023-09-09 ENCOUNTER — Telehealth: Payer: Self-pay | Admitting: Internal Medicine

## 2023-09-09 ENCOUNTER — Encounter: Payer: Self-pay | Admitting: Podiatry

## 2023-09-09 DIAGNOSIS — S92812A Other fracture of left foot, initial encounter for closed fracture: Secondary | ICD-10-CM | POA: Diagnosis not present

## 2023-09-09 DIAGNOSIS — M779 Enthesopathy, unspecified: Secondary | ICD-10-CM

## 2023-09-09 NOTE — Telephone Encounter (Signed)
Patient due for recall egd in Feb. States she is having a lot of nausea that is making her feel so sick. She is requesting to have EGD done prior to Feb. Please advise.

## 2023-09-09 NOTE — Patient Instructions (Addendum)
VISIT SUMMARY:  You visited [place] today due to left foot pain and swelling following a fall in July. Your left hand has healed, but your foot pain has worsened, preventing you from wearing a shoe. You also reported new numbness in both feet, which you believe is related to sciatica. We discussed your symptoms and planned the next steps for your treatment.  YOUR PLAN:  -FOOT FRACTURE: You have fractures in the small bones of your foot, which are causing your pain and swelling. We are concerned about the possibility of avascular necrosis, which is when bone tissue dies due to lack of blood supply. We will order an MRI to check for ligament damage and confirm this condition. You will need to wear a walking boot for at least 4 weeks and use Voltaren gel for pain relief. We will see you again 2 weeks after the MRI.  -NUMBNESS IN TOES: The numbness in your toes is likely related to sciatica, a condition where the sciatic nerve is irritated or compressed. Please monitor your symptoms and let us know if they worsen.  INSTRUCTIONS:  An MRI with [facility] is scheduled. Please wear the walking boot for at least 4 weeks and try Voltaren gel for pain relief. If your pain returns after trying a shoe in 4 weeks, continue using the boot until your follow-up appointment. We will see you again 2 weeks after the MRI.   Call [facility] to schedule your MRI at the below locations.  Please allow at least 1 business day after your visit to process the referral.  It may take longer depending on approval from insurance.  Please let me know if you have issues or problems scheduling the MRI   [facility] [phone] [address] [place] [place], [place] [address]  [facility] [phone] [address] [place], [place] [address]

## 2023-09-09 NOTE — Telephone Encounter (Signed)
I recommend increasing pantoprazole to 40 mg twice daily AC She does has history of cirrhosis but also gastric ulcer Okay for sooner EGD in [facility]; could potentially be with a another provider if patient is willing if I do not have procedure time prior to January Ondansetron 4 mg every 6-8 hours ODT as needed nausea

## 2023-09-09 NOTE — Progress Notes (Signed)
  Subjective:  Patient ID: Courtney Buckley, female    DOB: 1951/07/21,  MRN: [id number]  Chief Complaint  Patient presents with   Routine Post Op    PATIENT FELL IN JUNE AND SHE SAYS SOMETHING IS WRONG TO HER TO BECAUSE SHE CAN'T PUT ANY WEIGHT ON IT BECAUSE IT HURTS WHEN SHE DOES AND SHE CAN'T WEAR SHOES     Discussed the use of AI scribe software for clinical note transcription with the patient, who gave verbal consent to proceed.  History of Present Illness   The patient presents with left foot pain and swelling following a fall in July. She tripped while carrying a large can up the deck steps, bending her leg and arm back. The left hand has since healed, but the foot pain has worsened, preventing her from wearing a shoe. The pain is primarily located at the bottom of the foot and is exacerbated by footwear. The patient notes that the foot appears larger than the other and feels numb, a new symptom since the injury. She denies any bruising or significant swelling immediately after the injury, but was unable to walk on it right away. The patient also reports numbness in both feet, which she attributes to sciatica. She has a history of meniscus surgery on the left knee, which has caused persistent pain and altered gait, potentially contributing to the sciatica.          Objective:    Physical Exam   MUSCULOSKELETAL: Pain on palpation of both the fibular and tibial sesamoid on the plantar MTPJ, range of motion intact, no dislocation or ecchymosis.       No images are attached to the encounter.    Results   RADIOLOGY Left foot radiographs: Transverse fractures of both the tibial and fibular sesamoid with diastasis. No medial lateral diastasis. No significant sclerosis. No other joint abnormality or derangement.      Assessment:   1. Closed fracture of sesamoid bone of left foot, initial encounter      Plan:  Patient was evaluated and treated and all questions  answered.  Assessment and Plan    Foot Fracture   Transverse fractures of both the tibial and fibular sesamoid with diastasis have been identified, alongside pain on palpation of the fibular and tibial sesamoid on the plantar MTPJ, with no significant sclerosis or other joint abnormality noted. The possibility of avascular necrosis is concerning due to the fragile blood supply to the small sesamoid bones. We will order an MRI to assess ligament damage and confirm avascular necrosis. Immobilization with a walking boot for at least 4 weeks is initiated. She is advised to try Voltaren gel for pain management. A follow-up appointment is scheduled 2 weeks after the MRI.  Numbness in Toes   She reports new onset of numbness in both feet since the foot injury, which is likely related to sciatica rather than the foot injury itself. She is advised to monitor symptoms and report any worsening.  General Health Maintenance / Followup Plans   An MRI with [facility] is scheduled, along with a follow-up appointment 2 weeks after the MRI. If pain returns after trying a shoe in 4 weeks, she is advised to continue using the boot until the follow-up appointment.          No follow-ups on file.

## 2023-09-09 NOTE — Telephone Encounter (Signed)
Inbound call from patient, would like to discuss sooner EGD, she states she is having issues and would like to schedule it sooner than February. Patient would like to discuss with a nurse.   Thank you.

## 2023-09-10 ENCOUNTER — Encounter: Payer: Self-pay | Admitting: Physician Assistant

## 2023-09-10 ENCOUNTER — Other Ambulatory Visit: Payer: Self-pay

## 2023-09-10 ENCOUNTER — Other Ambulatory Visit: Payer: Self-pay | Admitting: Podiatry

## 2023-09-10 DIAGNOSIS — K7460 Unspecified cirrhosis of liver: Secondary | ICD-10-CM

## 2023-09-10 DIAGNOSIS — R112 Nausea with vomiting, unspecified: Secondary | ICD-10-CM

## 2023-09-10 MED ORDER — ONDANSETRON 4 MG PO TBDP: ORAL_TABLET | ORAL | 2 refills | Status: DC

## 2023-09-10 MED ORDER — PANTOPRAZOLE SODIUM 40 MG PO TBEC: 40.0000 mg | DELAYED_RELEASE_TABLET | Freq: Two times a day (BID) | ORAL | 3 refills | Status: DC

## 2023-09-10 NOTE — Telephone Encounter (Signed)
Prescriptions sent to pharmacy. Telephone previsit scheduled for 12/23@8 :30am. EGD scheduled in the [facility] 11/19/23 at 10am. Pt aware of appts and knows to pick up the prescriptions.

## 2023-09-15 ENCOUNTER — Ambulatory Visit
Admission: RE | Admit: 2023-09-15 | Discharge: 2023-09-15 | Disposition: A | Discharge status: Home / Self Care | Payer: Medicare PPO | Source: Ambulatory Visit | Attending: Podiatry | Admitting: Podiatry

## 2023-09-15 DIAGNOSIS — M79672 Pain in left foot: Secondary | ICD-10-CM | POA: Diagnosis not present

## 2023-09-15 DIAGNOSIS — M19072 Primary osteoarthritis, left ankle and foot: Secondary | ICD-10-CM | POA: Diagnosis not present

## 2023-09-15 DIAGNOSIS — S92812A Other fracture of left foot, initial encounter for closed fracture: Secondary | ICD-10-CM

## 2023-10-10 NOTE — Progress Notes (Addendum)
Assessment/Plan:   Courtney Buckley is a very pleasant 72 y.o. year old RH female with a history of hypertension, hyperlipidemia, cirrhosis of the liver/myelofibrosis, history of gastric ulcers/esophageal varices/PVT, history of migraines, arthritis seen today for evaluation of memory loss. MoCA today is 23/30..  Workup is in progress.  Patient is able to participate on her IADLs and continues to drive without significant difficulties. Etiology is unclear, workup is in progress. Prior to considering antidementia agent, it was recommended that she discuss with her oncologist and GI provider any potential interactions with long term medicines such as Jakafi. She is on pantoprazole as well, which may possibly contribute to memory issues if taken long term. Patient is able to participate on his ADLs and to drive without difficulties     Memory Impairment of unclear etiology  MRI brain with and without contrast to assess for underlying structural abnormality and assess vascular load  Neurocognitive testing to further evaluate cognitive concerns and determine other underlying cause of memory changes, including potential contribution from sleep, anxiety, attention, or depression  Continue to control mood as per PCP Recommend good control of cardiovascular risk factors Fol[place]ow up in 1-2  months  Subjective:   The patient is here alone   How long did patient have memory difficulties? I always had memory issues, but for the last few  months, especially if interrupted at which time she loses track "I am the undiagnosed ADD".  Reports some difficulty remembering new information, conversations and names.  Long-term memory is good. repeats oneself?  Denies Disoriented when walking into a room?  Patient denies except occasionally not remembering what patient came to the room for    Leaving objects in unusual places? Denies.   Wandering behavior?  Denies. Any personality changes?   Denies  Any  history of depression?:  "Always had a little depression especially since diagnosis of cancer Jakafi"    Hallucinations or paranoia?  Denies   Seizures?  Denies    Any sleep changes?  Takes THC gummies for the last year, "tried everything for sleep and nothing worked ". denies vivid dreams, REM behavior or sleepwalking   Sleep apnea?  Denies   Any hygiene concerns?  Denies   Independent of bathing and dressing?  Endorsed  Does the patient needs help with medications? Patient is in charge   Who is in charge of the finances? Patient is in charge.      Any changes in appetite?  Denies     Patient have trouble swallowing? Denies.   Does the patient cook? Not much.Denies any issues  Any kitchen accidents such as leaving the stove on? Denies.   Any history of headaches? 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 Denies.   Ambulates with difficulty?  Wears a L boot after a fall and torn meniscus  Recent falls or head injuries? Tripped in July when in a hurry and hurt her L knee and foot. She reports always having been somewhat clumsy.  Vision changes? Denies.   Unilateral weakness, numbness or tingling? Denies.   Any tremors?   Denies.   Any anosmia?  Denies.   Any incontinence of urine? Denies.   Any bowel dysfunction? "I have bad bowels, chronic constipation".  Patient lives alone, her husband likes in a separate house for last 10 years .  History of heavy alcohol intake? Denies.   History of heavy tobacco use? Denies.   Family history of  dementia? Mother had dementia  Does patient drive? Yes, denies getting lost or other issues   Recent labs: B12 550, MAM 266, TSH 1114  Past Medical History:  Diagnosis Date   Aneurysm, splenic artery (HCC)    Cirrhosis (HCC)    Colon polyps    Depression    Esophageal varices (HCC)    Gastric ulcer    Gastritis    Internal hemorrhoids    Liver cirrhosis (HCC)    Migraine    Myelofibrosis (HCC)     Portal hypertensive gastropathy (HCC)    PVT (portal vein thrombosis)    Thrombocytosis      Past Surgical History:  Procedure Laterality Date   BIOPSY  09/30/2021   Procedure: BIOPSY;  Surgeon: Shellia Cleverly, DO;  Location: [facility];  Service: Gastroenterology;;   ESOPHAGOGASTRODUODENOSCOPY (EGD) WITH PROPOFOL N/A 09/30/2021   Procedure: ESOPHAGOGASTRODUODENOSCOPY (EGD) WITH PROPOFOL;  Surgeon: Shellia Cleverly, DO;  Location: [facility];  Service: Gastroenterology;  Laterality: N/A;   FLEXIBLE SIGMOIDOSCOPY N/A 09/30/2021   Procedure: FLEXIBLE SIGMOIDOSCOPY;  Surgeon: Shellia Cleverly, DO;  Location: [facility];  Service: Gastroenterology;  Laterality: N/A;   IR ANGIOGRAM SELECTIVE EACH ADDITIONAL VESSEL  09/18/2021   IR ANGIOGRAM VISCERAL SELECTIVE  09/18/2021   IR EMBO ARTERIAL NOT HEMORR HEMANG INC GUIDE ROADMAPPING  09/18/2021   IR RADIOLOGIST EVAL & MGMT  09/09/2021   IR US GUIDE VASC ACCESS RIGHT  09/18/2021   KNEE ARTHROSCOPY Right 09/02/2022   Procedure: Right knee arthroscopy; medial meniscal debridement;  Surgeon: Ollen Gross, MD;  Location: [facility];  Service: Orthopedics;  Laterality: Right;   LIVER BIOPSY     RADIOLOGY WITH ANESTHESIA N/A 09/18/2021   Procedure: IR WITH ANESTHESIA SPLENIC ARTERY EMBOLIZATION;  Surgeon: Sterling Big, MD;  Location: [facility] OR;  Service: Radiology;  Laterality: N/A;   TONSILLECTOMY       No Known Allergies  Current Outpatient Medications  Medication Instructions   guaiFENesin-codeine (ROBITUSSIN AC) 100-10 MG/5ML syrup 5 mLs, Oral, 3 times daily PRN   Jakafi 10 mg, Oral, 2 times daily   Multiple Vitamin (MULTI VITAMIN PO) 1 tablet, Oral, Daily   ondansetron (ZOFRAN-ODT) 4 MG disintegrating tablet Take 1 by mouth every 6-8 hours as needed for nausea   pantoprazole (PROTONIX) 40 mg, Oral, Daily   pantoprazole (PROTONIX) 40 mg, Oral, 2 times daily   polyethylene glycol (MIRALAX / GLYCOLAX) 17 g, Oral, Daily    predniSONE (DELTASONE) 20 MG tablet Take 2 pills in the morning with breakfast for 3 days, then 1 pill for 2 days   promethazine (PHENERGAN) 12.5 mg, Oral, Every 6 hours PRN     VITALS:   Vitals:   10/12/23 1259  BP: 112/66  Pulse: 85  Resp: 20  SpO2: 96%  Height: 5\' 6"  (1.676 m)      PHYSICAL EXAM   HEENT:  Normocephalic, atraumatic. The superficial temporal arteries are without ropiness or tenderness. Cardiovascular: Regular rate and rhythm. Lungs: Clear to auscultation bilaterally. Neck: There are no carotid bruits noted bilaterally.  NEUROLOGICAL:    10/12/2023    1:00 PM  Montreal Cognitive Assessment   Visuospatial/ Executive (0/5) 1  Naming (0/3) 3  Attention: Read list of digits (0/2) 2  Attention: Read list of letters (0/1) 1  Attention: Serial 7 subtraction starting at 100 (0/3) 1  Language: Repeat phrase (0/2) 2  Language : Fluency (0/1) 1  Abstraction (0/2) 2  Delayed Recall (0/5) 4  Orientation (0/6) 6  Total 23  Adjusted Score (based on education) 23        No data to display           Orientation:  Alert and oriented to person, place and time. No aphasia or dysarthria. Fund of knowledge is appropriate. Recent memory impaired and remote memory intact.  Attention and concentration are normal.  Able to name objects and repeat phrases. Delayed recall 4/5 Cranial nerves: There is good facial symmetry. Extraocular muscles are intact and visual fields are full to confrontational testing. Speech is fluent and clear. No tongue deviation. Hearing is intact to conversational tone. Tone: Tone is good throughout. Sensation: Sensation is intact to light touch. Vibration is intact at the bilateral big toe.  Coordination: The patient has no difficulty with RAM's or FNF bilaterally. Normal finger to nose  Motor: Strength is 5/5 in the bilateral upper and lower extremities. There is no pronator drift. There are no fasciculations noted. DTR's: Deep tendon reflexes  are 2/4 bilaterally. Gait and Station: The patient is able to ambulate without difficulty The patient is able to heel toe walk . Gait is cautious and narrow. The patient is able to ambulate in a tandem fashion.       Thank you for allowing [place] the opportunity to participate in the care of this nice patient. Please do not hesitate to contact us for any questions or concerns.   Total time spent on today's visit was 54 minutes dedicated to this patient today, preparing to see patient, examining the patient, ordering tests and/or medications and counseling the patient, documenting clinical information in the EHR or other health record, independently interpreting results and communicating results to the patient/family, discussing treatment and goals, answering patient's questions and coordinating care.  Cc:  Allwardt, Eugenie Norrie 10/12/2023 5:42 PM

## 2023-10-12 ENCOUNTER — Ambulatory Visit: Payer: Medicare PPO

## 2023-10-12 ENCOUNTER — Ambulatory Visit: Payer: Medicare PPO | Admitting: Physician Assistant

## 2023-10-12 ENCOUNTER — Encounter: Payer: Self-pay | Admitting: Physician Assistant

## 2023-10-12 VITALS — BP 112/66 | HR 85 | Resp 20 | Ht 66.0 in

## 2023-10-12 DIAGNOSIS — R413 Other amnesia: Secondary | ICD-10-CM

## 2023-10-12 NOTE — Patient Instructions (Addendum)
It was a pleasure to see you today at our office.   Recommendations:  Neurocognitive evaluation at our office   MRI of the brain, the radiology office will call you to arrange you appointment   Follow up in 1  months Consult with your oncologist and and GI regarding starting donepezil ( Aricept) or memantine ( Namenda) Consult with your GI regarding pantoprazole Consider changing  to an alternative medication (i.e. lansoprazole) as there is evidence that if taken greater than 3 years, can contribute to memory difficulties.     For psychiatric meds, mood meds: Please have your primary care physician manage these medications.  If you have any severe symptoms of a stroke, or other severe issues such as confusion,severe chills or fever, etc call 911 or go to the ER as you may need to be evaluated further   For guidance regarding [facility] and if placement were needed at the facility, contact Social Worker tel: [phone]  For assessment of decision of mental capacity and competency:  Call Dr. Erick Blinks, geriatric psychiatrist at [phone]  Counseling regarding caregiver distress, including caregiver depression, anxiety and issues regarding community resources, adult day care programs, adult living facilities, or memory care questions:  please contact your  Primary Doctor's Social Worker   Whom to call: Memory  decline, memory medications: Call our office [phone]    [email or web address]   RECOMMENDATIONS FOR ALL PATIENTS WITH MEMORY PROBLEMS: 1. Continue to exercise (Recommend 30 minutes of walking everyday, or 3 hours every week) 2. Increase social interactions - continue going to [place] and enjoy social gatherings with friends and family 3. Eat healthy, avoid fried foods and eat more fruits and vegetables 4. Maintain adequate blood pressure, blood sugar, and blood cholesterol level. Reducing the risk  of stroke and cardiovascular disease also helps promoting better memory. 5. Avoid stressful situations. Live a simple life and avoid aggravations. Organize your time and prepare for the next day in anticipation. 6. Sleep well, avoid any interruptions of sleep and avoid any distractions in the bedroom that may interfere with adequate sleep quality 7. Avoid sugar, avoid sweets as there is a strong link between excessive sugar intake, diabetes, and cognitive impairment We discussed the Mediterranean diet, which has been shown to help patients reduce the risk of progressive memory disorders and reduces cardiovascular risk. This includes eating fish, eat fruits and green leafy vegetables, nuts like almonds and hazelnuts, walnuts, and also use olive oil. Avoid fast foods and fried foods as much as possible. Avoid sweets and sugar as sugar use has been linked to worsening of memory function.  There is always a concern of gradual progression of memory problems. If this is the case, then we may need to adjust level of care according to patient needs. Support, both to the patient and caregiver, should then be put into place.      You have been referred for a neuropsychological evaluation (i.e., evaluation of memory and thinking abilities). Please bring someone with you to this appointment if possible, as it is helpful for the doctor to hear from both you and another adult who knows you well. Please bring eyeglasses and hearing aids if you wear them.    The evaluation will take approximately 3 hours and has two parts:   The first part is a clinical interview with the neuropsychologist (Dr. Milbert Coulter or Dr. Roseanne Reno). During the interview, the neuropsychologist will speak with you and the individual you brought to the appointment.  The second part of the evaluation is testing with the doctor's technician Annabelle Harman or Selena Batten). During the testing, the technician will ask you to remember different types of material, solve  problems, and answer some questionnaires. Your family member will not be present for this portion of the evaluation.   Please note: We must reserve several hours of the neuropsychologist's time and the psychometrician's time for your evaluation appointment. As such, there is a No-Show fee of $100. If you are unable to attend any of your appointments, please contact our office as soon as possible to reschedule.      DRIVING: Regarding driving, in patients with progressive memory problems, driving will be impaired. We advise to have someone else do the driving if trouble finding directions or if minor accidents are reported. Independent driving assessment is available to determine safety of driving.   If you are interested in the driving assessment, you can contact the following:  [facility] [employer] [facility] in [place] [phone]  [facility] [phone]  [facility] [phone]  [facility] [phone] or [phone]   FALL PRECAUTIONS: Be cautious when walking. Scan the area for obstacles that may increase the risk of trips and falls. When getting up in the mornings, sit up at the edge of the bed for a few minutes before getting out of bed. Consider elevating the bed at the head end to avoid drop of blood pressure when getting up. Walk always in a well-lit room (use night lights in the walls). Avoid area rugs or power cords from appliances in the middle of the walkways. Use a walker or a cane if necessary and consider physical therapy for balance exercise. Get your eyesight checked regularly.  FINANCIAL OVERSIGHT: Supervision, especially oversight when making financial decisions or transactions is also recommended.  HOME SAFETY: Consider the safety of the kitchen when operating appliances like stoves, microwave oven, and blender. Consider having supervision and share cooking responsibilities until no longer able to participate in those. Accidents with  firearms and other hazards in the house should be identified and addressed as well.   ABILITY TO BE LEFT ALONE: If patient is unable to contact 911 operator, consider using LifeLine, or when the need is there, arrange for someone to stay with patients. Smoking is a fire hazard, consider supervision or cessation. Risk of wandering should be assessed by caregiver and if detected at any point, supervision and safe proof recommendations should be instituted.  MEDICATION SUPERVISION: Inability to self-administer medication needs to be constantly addressed. Implement a mechanism to ensure safe administration of the medications.      Mediterranean Diet A Mediterranean diet refers to food and lifestyle choices that are based on the traditions of countries located on the [place]. This way of eating has been shown to help prevent certain conditions and improve outcomes for people who have chronic diseases, like kidney disease and heart disease. What are tips for following this plan? Lifestyle  Cook and eat meals together with your family, when possible. Drink enough fluid to keep your urine clear or pale yellow. Be physically active every day. This includes: Aerobic exercise like running or swimming. Leisure activities like gardening, walking, or housework. Get 7-8 hours of sleep each night. If recommended by your health care provider, drink red wine in moderation. This means 1 glass a day for nonpregnant women and 2 glasses a day for men. A glass of wine equals 5 oz (150 mL). Reading food labels  Check the serving size  of packaged foods. For foods such as rice and pasta, the serving size refers to the amount of cooked product, not dry. Check the total fat in packaged foods. Avoid foods that have saturated fat or trans fats. Check the ingredients list for added sugars, such as corn syrup. Shopping  At the grocery store, buy most of your food from the areas near the walls of the store. This  includes: Fresh fruits and vegetables (produce). Grains, beans, nuts, and seeds. Some of these may be available in unpackaged forms or large amounts (in bulk). Fresh seafood. Poultry and eggs. Low-fat dairy products. Buy whole ingredients instead of prepackaged foods. Buy fresh fruits and vegetables in-season from local farmers markets. Buy frozen fruits and vegetables in resealable bags. If you do not have access to quality fresh seafood, buy precooked frozen shrimp or canned fish, such as tuna, salmon, or sardines. Buy small amounts of raw or cooked vegetables, salads, or olives from the deli or salad bar at your store. Stock your pantry so you always have certain foods on hand, such as olive oil, canned tuna, canned tomatoes, rice, pasta, and beans. Cooking  Cook foods with extra-virgin olive oil instead of using butter or other vegetable oils. Have meat as a side dish, and have vegetables or grains as your main dish. This means having meat in small portions or adding small amounts of meat to foods like pasta or stew. Use beans or vegetables instead of meat in common dishes like chili or lasagna. Experiment with different cooking methods. Try roasting or broiling vegetables instead of steaming or sauteing them. Add frozen vegetables to soups, stews, pasta, or rice. Add nuts or seeds for added healthy fat at each meal. You can add these to yogurt, salads, or vegetable dishes. Marinate fish or vegetables using olive oil, lemon juice, garlic, and fresh herbs. Meal planning  Plan to eat 1 vegetarian meal one day each week. Try to work up to 2 vegetarian meals, if possible. Eat seafood 2 or more times a week. Have healthy snacks readily available, such as: Vegetable sticks with hummus. Greek yogurt. Fruit and nut trail mix. Eat balanced meals throughout the week. This includes: Fruit: 2-3 servings a day Vegetables: 4-5 servings a day Low-fat dairy: 2 servings a day Fish, poultry, or  lean meat: 1 serving a day Beans and legumes: 2 or more servings a week Nuts and seeds: 1-2 servings a day Whole grains: 6-8 servings a day Extra-virgin olive oil: 3-4 servings a day Limit red meat and sweets to only a few servings a month What are my food choices? Mediterranean diet Recommended Grains: Whole-grain pasta. Brown rice. Bulgar wheat. Polenta. Couscous. Whole-wheat bread. Orpah Cobb. Vegetables: Artichokes. Beets. Broccoli. Cabbage. Carrots. Eggplant. Green beans. Chard. Kale. Spinach. Onions. Leeks. Peas. Squash. Tomatoes. Peppers. Radishes. Fruits: Apples. Apricots. Avocado. Berries. Bananas. Cherries. Dates. Figs. Grapes. Lemons. Melon. Oranges. Peaches. Plums. Pomegranate. Meats and other protein foods: Beans. Almonds. Sunflower seeds. Pine nuts. Peanuts. Cod. Salmon. Scallops. Shrimp. Tuna. Tilapia. Clams. Oysters. Eggs. Dairy: Low-fat milk. Cheese. Greek yogurt. Beverages: Water. Red wine. Herbal tea. Fats and oils: Extra virgin olive oil. Avocado oil. Grape seed oil. Sweets and desserts: [place] yogurt with honey. Baked apples. Poached pears. Trail mix. Seasoning and other foods: Basil. Cilantro. Coriander. Cumin. Mint. Parsley. Sage. Rosemary. Tarragon. Garlic. Oregano. Thyme. Pepper. Balsalmic vinegar. Tahini. Hummus. Tomato sauce. Olives. Mushrooms. Limit these Grains: Prepackaged pasta or rice dishes. Prepackaged cereal with added sugar. Vegetables: Deep fried potatoes (french fries).  Fruits: Fruit canned in syrup. Meats and other protein foods: Beef. Pork. Lamb. Poultry with skin. Hot dogs. Tomasa Blase. Dairy: Ice cream. Sour cream. Whole milk. Beverages: Juice. Sugar-sweetened soft drinks. Beer. Liquor and spirits. Fats and oils: Butter. Canola oil. Vegetable oil. Beef fat (tallow). Lard. Sweets and desserts: Cookies. Cakes. Pies. Candy. Seasoning and other foods: Mayonnaise. Premade sauces and marinades. The items listed may not be a complete list. Talk with your  dietitian about what dietary choices are right for you. Summary The Mediterranean diet includes both food and lifestyle choices. Eat a variety of fresh fruits and vegetables, beans, nuts, seeds, and whole grains. Limit the amount of red meat and sweets that you eat. Talk with your health care provider about whether it is safe for you to drink red wine in moderation. This means 1 glass a day for nonpregnant women and 2 glasses a day for men. A glass of wine equals 5 oz (150 mL). This information is not intended to replace advice given to you by your health care provider. Make sure you discuss any questions you have with your health care provider. Document Released: 06/11/2016 Document Revised: 07/14/2016 Document Reviewed: 06/11/2016 Elsevier Interactive Patient Education  2017 [employer].

## 2023-10-14 ENCOUNTER — Ambulatory Visit (INDEPENDENT_AMBULATORY_CARE_PROVIDER_SITE_OTHER): Payer: Medicare PPO | Admitting: Podiatry

## 2023-10-14 DIAGNOSIS — S92812D Other fracture of left foot, subsequent encounter for fracture with routine healing: Secondary | ICD-10-CM | POA: Diagnosis not present

## 2023-10-14 MED ORDER — MELOXICAM 7.5 MG PO TABS: 7.5000 mg | ORAL_TABLET | Freq: Every day | ORAL | 0 refills | Status: DC

## 2023-10-16 NOTE — Progress Notes (Signed)
  Subjective:  Patient ID: Courtney Buckley, female    DOB: 1951-05-23,  MRN: [id number]  Chief Complaint  Patient presents with   Routine Post Op   Returns for follow-up after completing the MRI she has been using the boot and notes improvement     Discussed the use of AI scribe software for clinical note transcription with the patient, who gave verbal consent to proceed.  History of Present Illness   She is doing somewhat better noting improvement   Objective:    Physical Exam   MUSCULOSKELETAL today she has minimal pain on the plantar sesamoid complex there is pain still on end range of motion with dorsiflexion of the hallux      No images are attached to the encounter.    Results   RADIOLOGY Left foot radiographs: Transverse fractures of both the tibial and fibular sesamoid with diastasis. No medial lateral diastasis. No significant sclerosis. No other joint abnormality or derangement.       MRI 09/15/2023 IMPRESSION: 1. 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 Fracture diastasis of both sesamoids, as above. No evidence of osteonecrosis. 2. No acute ligamentous injury. 3. 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     Electronically Signed   By: Duanne Guess D.O.   On: 09/15/2023 16:14 Assessment:   Encounter Diagnosis  Name Primary?   Closed fracture of sesamoid bone of left foot with routine healing, subsequent encounter Yes      Plan:  Patient was evaluated and treated and all questions answered.  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  I will see her back in 6 weeks for follow-up and new radiographs.      No follow-ups on file.

## 2023-10-25 ENCOUNTER — Telehealth: Payer: Self-pay

## 2023-10-25 ENCOUNTER — Ambulatory Visit (AMBULATORY_SURGERY_CENTER): Payer: Medicare PPO

## 2023-10-25 VITALS — Ht 66.0 in | Wt 195.0 lb

## 2023-10-25 DIAGNOSIS — R112 Nausea with vomiting, unspecified: Secondary | ICD-10-CM

## 2023-10-25 DIAGNOSIS — K7460 Unspecified cirrhosis of liver: Secondary | ICD-10-CM

## 2023-10-25 MED ORDER — NA SULFATE-K SULFATE-MG SULF 17.5-3.13-1.6 GM/177ML PO SOLN
1.0000 | Freq: Once | ORAL | 0 refills | Status: AC
Start: 2023-10-25 — End: 2023-10-25

## 2023-10-25 NOTE — Telephone Encounter (Signed)
Called for pre visit.  No answer.  Left message that I would call back in 5 min

## 2023-10-25 NOTE — Telephone Encounter (Signed)
Contacted patient and completed PV

## 2023-10-25 NOTE — Progress Notes (Signed)

## 2023-11-04 DIAGNOSIS — J069 Acute upper respiratory infection, unspecified: Secondary | ICD-10-CM | POA: Diagnosis not present

## 2023-11-04 DIAGNOSIS — Z20822 Contact with and (suspected) exposure to covid-19: Secondary | ICD-10-CM | POA: Diagnosis not present

## 2023-11-14 ENCOUNTER — Other Ambulatory Visit: Payer: Self-pay | Admitting: Podiatry

## 2023-11-15 ENCOUNTER — Telehealth: Payer: Self-pay

## 2023-11-15 NOTE — Telephone Encounter (Signed)
 Mri brain w and wo contrast was not done, multiple attempts to contact patient from [facility].FYI

## 2023-11-16 ENCOUNTER — Encounter: Payer: Self-pay | Admitting: Internal Medicine

## 2023-11-18 ENCOUNTER — Telehealth: Payer: Self-pay | Admitting: Internal Medicine

## 2023-11-18 NOTE — Telephone Encounter (Signed)
Inbound call from patient stating that she needed to reschedule her EGD for tomorrow at 10:00 due to being sick. Patient was rescheduled for 3/10 at 9:00

## 2023-11-19 ENCOUNTER — Encounter: Payer: Medicare PPO | Admitting: Internal Medicine

## 2023-11-22 ENCOUNTER — Telehealth: Payer: Self-pay | Admitting: Podiatry

## 2023-11-22 NOTE — Telephone Encounter (Signed)
Pt lvm on Friday 1/17 at 457pm to cxl appt for Tuesday with Dr Lilian Kapur.  I returned call and left message for pt to call to r/s appt when she is available.

## 2023-11-23 ENCOUNTER — Ambulatory Visit: Payer: Medicare PPO | Admitting: Podiatry

## 2023-11-23 ENCOUNTER — Ambulatory Visit: Payer: Medicare PPO | Admitting: Physician Assistant

## 2023-11-24 ENCOUNTER — Other Ambulatory Visit: Payer: Self-pay

## 2023-11-24 DIAGNOSIS — K7460 Unspecified cirrhosis of liver: Secondary | ICD-10-CM

## 2023-12-04 ENCOUNTER — Other Ambulatory Visit: Payer: Self-pay | Admitting: Internal Medicine

## 2023-12-06 DIAGNOSIS — D7581 Myelofibrosis: Secondary | ICD-10-CM | POA: Diagnosis not present

## 2023-12-06 DIAGNOSIS — R110 Nausea: Secondary | ICD-10-CM | POA: Diagnosis not present

## 2023-12-06 DIAGNOSIS — Z8711 Personal history of peptic ulcer disease: Secondary | ICD-10-CM | POA: Diagnosis not present

## 2023-12-06 DIAGNOSIS — R413 Other amnesia: Secondary | ICD-10-CM | POA: Diagnosis not present

## 2023-12-06 DIAGNOSIS — Z79899 Other long term (current) drug therapy: Secondary | ICD-10-CM | POA: Diagnosis not present

## 2024-01-10 ENCOUNTER — Encounter: Payer: Self-pay | Admitting: Internal Medicine

## 2024-01-10 ENCOUNTER — Ambulatory Visit (AMBULATORY_SURGERY_CENTER): Payer: Medicare PPO | Admitting: Internal Medicine

## 2024-01-10 VITALS — BP 124/82 | HR 62 | Temp 98.8°F | Resp 14 | Ht 66.0 in | Wt 195.0 lb

## 2024-01-10 DIAGNOSIS — I864 Gastric varices: Secondary | ICD-10-CM | POA: Diagnosis not present

## 2024-01-10 DIAGNOSIS — K766 Portal hypertension: Secondary | ICD-10-CM

## 2024-01-10 DIAGNOSIS — T182XXA Foreign body in stomach, initial encounter: Secondary | ICD-10-CM

## 2024-01-10 DIAGNOSIS — Z8711 Personal history of peptic ulcer disease: Secondary | ICD-10-CM

## 2024-01-10 DIAGNOSIS — F32A Depression, unspecified: Secondary | ICD-10-CM | POA: Diagnosis not present

## 2024-01-10 MED ORDER — SODIUM CHLORIDE 0.9 % IV SOLN: 500.0000 mL | Freq: Once | INTRAVENOUS | Status: DC

## 2024-01-10 NOTE — Progress Notes (Signed)
 GASTROENTEROLOGY PROCEDURE H&P NOTE   Primary Care Physician: Allwardt, Crist Infante, PA-C    Reason for Procedure:  Cirrhosis with portal hypertension, chronic portal vein thrombosis and history of isolated gastric varices  Plan:    Upper endoscopy  Patient is appropriate for endoscopic procedure(s) in the ambulatory ([facility]) setting.  The nature of the procedure, as well as the risks, benefits, and alternatives were carefully and thoroughly reviewed with the patient. Ample time for discussion and questions allowed. The patient understood, was satisfied, and agreed to proceed.     HPI: Courtney Buckley is a 73 y.o. female who presents for EGD.  Medical history as below. No recent chest pain or shortness of breath.  No abdominal pain today.  Past Medical History:  Diagnosis Date   Aneurysm, splenic artery (HCC)    Cancer (HCC)    Cirrhosis (HCC)    Colon polyps    Depression    Esophageal varices (HCC)    Gastric ulcer    Gastritis    Heart murmur    Internal hemorrhoids    Liver cirrhosis (HCC)    Migraine    Myelofibrosis (HCC)    Portal hypertensive gastropathy (HCC)    PVT (portal vein thrombosis)    Thrombocytosis     Past Surgical History:  Procedure Laterality Date   BIOPSY  09/30/2021   Procedure: BIOPSY;  Surgeon: Shellia Cleverly, DO;  [facility];  Service: Gastroenterology;;   ESOPHAGOGASTRODUODENOSCOPY (EGD) WITH PROPOFOL N/A 09/30/2021   Procedure: ESOPHAGOGASTRODUODENOSCOPY (EGD) WITH PROPOFOL;  Surgeon: Shellia Cleverly, DO;  Location: [facility];  Service: Gastroenterology;  Laterality: N/A;   FLEXIBLE SIGMOIDOSCOPY N/A 09/30/2021   Procedure: FLEXIBLE SIGMOIDOSCOPY;  Surgeon: Shellia Cleverly, DO;  Location: [facility];  Service: Gastroenterology;  Laterality: N/A;   IR ANGIOGRAM SELECTIVE EACH ADDITIONAL VESSEL  09/18/2021   IR ANGIOGRAM VISCERAL SELECTIVE  09/18/2021   IR EMBO ARTERIAL NOT HEMORR HEMANG INC GUIDE ROADMAPPING   09/18/2021   IR RADIOLOGIST EVAL & MGMT  09/09/2021   IR US GUIDE VASC ACCESS RIGHT  09/18/2021   KNEE ARTHROSCOPY Right 09/02/2022   Procedure: Right knee arthroscopy; medial meniscal debridement;  Surgeon: Ollen Gross, MD;  Location: [facility];  Service: Orthopedics;  Laterality: Right;   LIVER BIOPSY     RADIOLOGY WITH ANESTHESIA N/A 09/18/2021   Procedure: IR WITH ANESTHESIA SPLENIC ARTERY EMBOLIZATION;  Surgeon: Sterling Big, MD;  Location: [facility];  Service: Radiology;  Laterality: N/A;   TONSILLECTOMY      Prior to Admission medications   Medication Sig Start Date End Date Taking? Authorizing Provider  JAKAFI 10 MG tablet Take 10 mg by mouth 2 (two) times daily. 06/11/21  Yes [provider]  meloxicam (MOBIC) 7.5 MG tablet TAKE 1 TABLET BY MOUTH EVERY DAY 11/15/23  Yes McDonald, Adam R, DPM  ondansetron (ZOFRAN-ODT) 4 MG disintegrating tablet Take 1 by mouth every 6-8 hours as needed for nausea 09/10/23  Yes Teonna Coonan, Carie Caddy, MD  pantoprazole (PROTONIX) 40 MG tablet TAKE 1 TABLET BY MOUTH TWICE A DAY 12/06/23  Yes Lalah Durango, Carie Caddy, MD  polyethylene glycol (MIRALAX / GLYCOLAX) 17 g packet Take 17 g by mouth daily. Patient not taking: Reported on 10/25/2023    [provider]  promethazine (PHENERGAN) 12.5 MG tablet Take 1 tablet (12.5 mg total) by mouth every 6 (six) hours as needed for nausea (May cause drowsiness.). 09/08/23   Dulce Sellar, NP    Current Outpatient Medications  Medication Sig Dispense Refill   JAKAFI 10 MG tablet Take 10 mg by mouth 2 (two) times daily.     meloxicam (MOBIC) 7.5 MG tablet TAKE 1 TABLET BY MOUTH EVERY DAY 30 tablet 0   ondansetron (ZOFRAN-ODT) 4 MG disintegrating tablet Take 1 by mouth every 6-8 hours as needed for nausea 30 tablet 2   pantoprazole (PROTONIX) 40 MG tablet TAKE 1 TABLET BY MOUTH TWICE A DAY 180 tablet 0   polyethylene glycol (MIRALAX / GLYCOLAX) 17 g packet Take 17 g by mouth daily. (Patient not taking: Reported on  10/25/2023)     promethazine (PHENERGAN) 12.5 MG tablet Take 1 tablet (12.5 mg total) by mouth every 6 (six) hours as needed for nausea (May cause drowsiness.). 30 tablet 0   Current Facility-Administered Medications  Medication Dose Route Frequency Provider Last Rate Last Admin   0.9 %  sodium chloride infusion  500 mL Intravenous Once Sherise Geerdes, Carie Caddy, MD        Allergies as of 01/10/2024   (No Known Allergies)    Family History  Problem Relation Age of Onset   Cancer - Lung Father    Diabetes Mellitus II Maternal Aunt    Liver cancer Maternal Aunt    Stomach cancer Maternal Aunt    Diabetes Mellitus II Maternal Uncle    Colon cancer Neg Hx    Rectal cancer Neg Hx    Esophageal cancer Neg Hx    Liver disease Neg Hx    Pancreatic cancer Neg Hx    Colon polyps Neg Hx     Social History   Socioeconomic History   Marital status: Significant Other    Spouse name: Not on file   Number of children: 2   Years of education: Not on file   Highest education level: Bachelor's degree (e.g., BA, AB, BS)  Occupational History   Not on file  Tobacco Use   Smoking status: Former    Types: Cigarettes   Smokeless tobacco: Never  Vaping Use   Vaping status: Never Used  Substance and Sexual Activity   Alcohol use: Not Currently    Comment: none now,   Drug use: No   Sexual activity: Not Currently  Other Topics Concern   Not on file  Social History Narrative   Right handed   Bachelors degree   Occasionally caffeine   2 children   Social Drivers of Health   Financial Resource Strain: Patient Declined (09/05/2023)   Overall Financial Resource Strain (CARDIA)    Difficulty of Paying Living Expenses: Patient declined  Food Insecurity: No Food Insecurity (09/05/2023)   Hunger Vital Sign    Worried About Running Out of Food in the Last Year: Never true    Ran Out of Food in the Last Year: Never true  Transportation Needs: No Transportation Needs (09/05/2023)   PRAPARE -  Administrator, Civil Service (Medical): No    Lack of Transportation (Non-Medical): No  Physical Activity: Inactive (09/05/2023)   Exercise Vital Sign    Days of Exercise per Week: 0 days    Minutes of Exercise per Session: 0 min  Stress: No Stress Concern Present (09/05/2023)   [employer] of Occupational Health - Occupational Stress Questionnaire    Feeling of Stress : Only a little  Social Connections: Socially Isolated (09/05/2023)   Social Connection and Isolation Panel [NHANES]    Frequency of Communication with Friends and Family: More than three times a week  Frequency of Social Gatherings with Friends and Family: Twice a week    Attends Religious Services: Never    Database administrator or Organizations: No    Attends Banker Meetings: Never    Marital Status: Separated  Intimate Partner Violence: Not At Risk (06/14/2023)   Humiliation, Afraid, Rape, and Kick questionnaire    Fear of Current or Ex-Partner: No    Emotionally Abused: No    Physically Abused: No    Sexually Abused: No    Physical Exam: Vital signs in last 24 hours: @BP  111/64   Pulse (!) 57   Temp 98.8 F (37.1 C) (Skin)   Ht 5\' 6"  (1.676 m)   Wt 195 lb (88.5 kg)   SpO2 94%   BMI 31.47 kg/m  GEN: NAD EYE: Sclerae anicteric ENT: MMM CV: Non-tachycardic Pulm: CTA b/l GI: Soft, NT/ND NEURO:  Alert & Oriented x 3   Erick Blinks, MD [facility]  01/10/2024 9:56 AM

## 2024-01-10 NOTE — Progress Notes (Signed)
 RECALL PUT IN FOR APPT. IN 4-6 MONTHS.

## 2024-01-10 NOTE — Progress Notes (Signed)
 Pt's states no medical or surgical changes since previsit or office visit.

## 2024-01-10 NOTE — Progress Notes (Signed)
 To pacu, VSS. Report to RN.tb

## 2024-01-10 NOTE — Patient Instructions (Signed)
  Resume previous diet  Continue present medications  OFFICE WILL CALL YOU FOR FOLLOW UP VISIT IN 4-6 MONTHS   YOU HAD AN ENDOSCOPIC PROCEDURE TODAY AT THE [facility]:   Refer to the procedure report that was given to you for any specific questions about what was found during the examination.  If the procedure report does not answer your questions, please call your gastroenterologist to clarify.  If you requested that your care partner not be given the details of your procedure findings, then the procedure report has been included in a sealed envelope for you to review at your convenience later.  YOU SHOULD EXPECT: Some feelings of bloating in the abdomen. Passage of more gas than usual.  Walking can help get rid of the air that was put into your GI tract during the procedure and reduce the bloating. If you had a lower endoscopy (such as a colonoscopy or flexible sigmoidoscopy) you may notice spotting of blood in your stool or on the toilet paper. If you underwent a bowel prep for your procedure, you may not have a normal bowel movement for a few days.  Please Note:  You might notice some irritation and congestion in your nose or some drainage.  This is from the oxygen used during your procedure.  There is no need for concern and it should clear up in a day or so.  SYMPTOMS TO REPORT IMMEDIATELY:  Following upper endoscopy (EGD)  Vomiting of blood or coffee ground material  New chest pain or pain under the shoulder blades  Painful or persistently difficult swallowing  New shortness of breath  Fever of 100F or higher  Black, tarry-looking stools  For urgent or emergent issues, a gastroenterologist can be reached at any hour by calling [phone]. Do not use MyChart messaging for urgent concerns.    DIET:  We do recommend a small meal at first, but then you may proceed to your regular diet.  Drink plenty of fluids but you should avoid alcoholic beverages for 24  hours.  ACTIVITY:  You should plan to take it easy for the rest of today and you should NOT DRIVE or use heavy machinery until tomorrow (because of the sedation medicines used during the test).    FOLLOW UP: Our staff will call the number listed on your records the next business day following your procedure.  We will call around 7:15- 8:00 am to check on you and address any questions or concerns that you may have regarding the information given to you following your procedure. If we do not reach you, we will leave a message.     If any biopsies were taken you will be contacted by phone or by letter within the next 1-3 weeks.  Please call us at [phone] if you have not heard about the biopsies in 3 weeks.    SIGNATURES/CONFIDENTIALITY: You and/or your care partner have signed paperwork which will be entered into your electronic medical record.  These signatures attest to the fact that that the information above on your After Visit Summary has been reviewed and is understood.  Full responsibility of the confidentiality of this discharge information lies with you and/or your care-partner.

## 2024-01-10 NOTE — Op Note (Signed)
 [facility] Patient Name: Courtney Buckley Procedure Date: 01/10/2024 9:57 AM MRN: [id number] Endoscopist: Beverley Fiedler , MD, [id number] Age: 73 Referring MD:  Date of Birth: 1950-12-16 Gender: Female Account #: [id number] Procedure:                Upper GI endoscopy Indications:              Gastric varices (GOV1), cirrhosis, history of PUD,                            last exam Nov 2023 (clip of gastric AVM at that                            time), recent nausea Medicines:                Monitored Anesthesia Care Procedure:                Pre-Anesthesia Assessment:                           - Prior to the procedure, a History and Physical                            was performed, and patient medications and                            allergies were reviewed. The patient's tolerance of                            previous anesthesia was also reviewed. The risks                            and benefits of the procedure and the sedation                            options and risks were discussed with the patient.                            All questions were answered, and informed consent                            was obtained. Prior Anticoagulants: The patient has                            taken no anticoagulant or antiplatelet agents. ASA                            Grade Assessment: III - A patient with severe                            systemic disease. After reviewing the risks and                            benefits, the patient was deemed in satisfactory  condition to undergo the procedure.                           After obtaining informed consent, the endoscope was                            passed under direct vision. Throughout the                            procedure, the patient's blood pressure, pulse, and                            oxygen saturations were monitored continuously. The                            Olympus Scope [id number] was  introduced through the                            mouth, and advanced to the second part of duodenum.                            The upper GI endoscopy was accomplished without                            difficulty. The patient tolerated the procedure                            well. Scope In: Scope Out: Findings:                 The examined esophagus was normal.                           Type 1 isolated gastric varices (IGV1, varices                            located in the fundus) with no bleeding were found                            in the stomach. They were small in largest diameter.                           An endoclip was found in the gastric body.                           The exam of the stomach was otherwise normal.                           The examined duodenum was normal. Complications:            No immediate complications. Estimated Blood Loss:     Estimated blood loss: none. Impression:               - Normal esophagus.                           -  Type 1 isolated gastric varices (IGV1, varices                            located in the fundus), without bleeding. Small.                           - An endoclip was found in the stomach.                           - Otherwise normal stomach.                           - Normal examined duodenum.                           - No specimens collected. Recommendation:           - Patient has a contact number available for                            emergencies. The signs and symptoms of potential                            delayed complications were discussed with the                            patient. Return to normal activities tomorrow.                            Written discharge instructions were provided to the                            patient.                           - Resume previous diet.                           - Continue present medications.                           - Repeat upper endoscopy in 2 years for                             surveillance.                           - Office visit with me in 4-6 months. Beverley Fiedler, MD 01/10/2024 10:20:11 AM This report has been signed electronically.

## 2024-01-11 ENCOUNTER — Telehealth: Payer: Self-pay

## 2024-01-11 NOTE — Telephone Encounter (Signed)
 No answer after follow up call. Voice message left.

## 2024-02-22 ENCOUNTER — Ambulatory Visit: Admitting: Family

## 2024-02-22 ENCOUNTER — Encounter: Payer: Self-pay | Admitting: Family

## 2024-02-22 VITALS — BP 113/69 | HR 65 | Temp 98.0°F | Ht 66.0 in | Wt 180.2 lb

## 2024-02-22 DIAGNOSIS — M25472 Effusion, left ankle: Secondary | ICD-10-CM | POA: Diagnosis not present

## 2024-02-22 DIAGNOSIS — M25572 Pain in left ankle and joints of left foot: Secondary | ICD-10-CM | POA: Diagnosis not present

## 2024-02-22 DIAGNOSIS — H9193 Unspecified hearing loss, bilateral: Secondary | ICD-10-CM | POA: Diagnosis not present

## 2024-02-22 NOTE — Progress Notes (Signed)
 Patient ID: Courtney Buckley, female    DOB: 05/25/1951, 73 y.o.   MRN: [id number]  Chief Complaint  Patient presents with   Hearing Loss    Pt c/o bilateral hearing loss, present for 1 year. Pt would like a referral to Audiology.    Ankle Injury    Pt c/o left ankle injury 2 years ago. Pt would like referral to ortho.  Discussed the use of AI scribe software for clinical note transcription with the patient, who gave verbal consent to proceed.  History of Present Illness The patient, with a history of a fractured left ankle and toe, presents with bilateral hearing loss and left ankle swelling. The hearing loss has been gradual, starting last summer, and has progressed to the point where she cannot hear people talking unless she is directly in front of them. She reports an incident where her son was yelling for her in her home and she did not hear him until he entered the room. She also reports not hearing her son talking behind her at a recent family gathering.  The left ankle swelling has been a persistent issue since a closed fracture two years ago. The swelling starts out minimal in the morning and increases throughout the day. Recently, she has been waking up with some swelling already present. The swelling is accompanied by mild pain when walking. She reports no recent sprains or injuries to the ankle.  Assessment & Plan Ankle swelling and pain Chronic left ankle swelling and pain, possibly exacerbated by a recent fall in which she fx her left great toe. Previous ankle fracture treated with a boot. No systemic edema or signs of gout. Avoiding diuretics due to localized nature. Has not tried a compression sleeve. - Refer to podiatrist, Jennefer Moats, for further evaluation. - Advised on use of OTC ankle compression sleeve for swelling and support until seen by podiatrist.  Hearing loss, unspecified Gradual bilateral hearing loss since last summer, significant enough to cause  concern. - Send referral to [facility] outpatient audiology for hearing evaluation.   Subjective:    Outpatient Medications Prior to Visit  Medication Sig Dispense Refill   JAKAFI  10 MG tablet Take 10 mg by mouth 2 (two) times daily.     meloxicam  (MOBIC ) 7.5 MG tablet TAKE 1 TABLET BY MOUTH EVERY DAY 30 tablet 0   ondansetron  (ZOFRAN -ODT) 4 MG disintegrating tablet Take 1 by mouth every 6-8 hours as needed for nausea 30 tablet 2   pantoprazole  (PROTONIX ) 40 MG tablet TAKE 1 TABLET BY MOUTH TWICE A DAY 180 tablet 0   polyethylene glycol (MIRALAX / GLYCOLAX) 17 g packet Take 17 g by mouth daily.     promethazine  (PHENERGAN ) 12.5 MG tablet Take 1 tablet (12.5 mg total) by mouth every 6 (six) hours as needed for nausea (May cause drowsiness.). (Patient not taking: Reported on 02/22/2024) 30 tablet 0   No facility-administered medications prior to visit.   Past Medical History:  Diagnosis Date   Aneurysm, splenic artery (HCC)    Cancer (HCC)    Cirrhosis (HCC)    Colon polyps    Depression    Esophageal varices (HCC)    Gastric ulcer    Gastritis    Heart murmur    Internal hemorrhoids    Liver cirrhosis (HCC)    Migraine    Myelofibrosis (HCC)    Portal hypertensive gastropathy (HCC)    PVT (portal vein thrombosis)    Thrombocytosis    Past Surgical  History:  Procedure Laterality Date   BIOPSY  09/30/2021   Procedure: BIOPSY;  Surgeon: Annis Kinder, DO;  [facility];  Service: Gastroenterology;;   ESOPHAGOGASTRODUODENOSCOPY (EGD) WITH PROPOFOL  N/A 09/30/2021   Procedure: ESOPHAGOGASTRODUODENOSCOPY (EGD) WITH PROPOFOL ;  Surgeon: Annis Kinder, DO;  [facility]: [facility];  Service: Gastroenterology;  Laterality: N/A;   FLEXIBLE SIGMOIDOSCOPY N/A 09/30/2021   Procedure: FLEXIBLE SIGMOIDOSCOPY;  Surgeon: Annis Kinder, DO;  Location: [facility];  Service: Gastroenterology;  Laterality: N/A;   IR ANGIOGRAM SELECTIVE EACH ADDITIONAL VESSEL  09/18/2021    IR ANGIOGRAM VISCERAL SELECTIVE  09/18/2021   IR EMBO ARTERIAL NOT HEMORR HEMANG INC GUIDE ROADMAPPING  09/18/2021   IR RADIOLOGIST EVAL & MGMT  09/09/2021   IR US  GUIDE VASC ACCESS RIGHT  09/18/2021   KNEE ARTHROSCOPY Right 09/02/2022   Procedure: Right knee arthroscopy; medial meniscal debridement;  Surgeon: Liliane Rei, MD;  Location: [facility];  Service: Orthopedics;  Laterality: Right;   LIVER BIOPSY     RADIOLOGY WITH ANESTHESIA N/A 09/18/2021   Procedure: IR WITH ANESTHESIA SPLENIC ARTERY EMBOLIZATION;  Surgeon: Roxie Cord, MD;  Location: [facility];  Service: Radiology;  Laterality: N/A;   TONSILLECTOMY     No Known Allergies    Objective:    Physical Exam Vitals and nursing note reviewed.  Constitutional:      Appearance: Normal appearance.  Cardiovascular:     Rate and Rhythm: Normal rate and regular rhythm.  Pulmonary:     Effort: Pulmonary effort is normal.     Breath sounds: Normal breath sounds.  Musculoskeletal:     Left ankle: Swelling (mostly lateral, no erythema noted) present. No ecchymosis. Tenderness present. Decreased range of motion.  Skin:    General: Skin is warm and dry.  Neurological:     Mental Status: She is alert.  Psychiatric:        Mood and Affect: Mood normal.        Behavior: Behavior normal.    BP 113/69 (BP Location: Left Arm, Patient Position: Sitting, Cuff Size: Large)   Pulse 65   Temp 98 F (36.7 C) (Temporal)   Ht 5\' 6"  (1.676 m)   Wt 180 lb 3.2 oz (81.7 kg)   SpO2 96%   BMI 29.09 kg/m  Wt Readings from Last 3 Encounters:  02/22/24 180 lb 3.2 oz (81.7 kg)  01/10/24 195 lb (88.5 kg)  10/25/23 195 lb (88.5 kg)      Versa Gore, NP

## 2024-03-06 DIAGNOSIS — I728 Aneurysm of other specified arteries: Secondary | ICD-10-CM | POA: Diagnosis not present

## 2024-03-06 DIAGNOSIS — K7460 Unspecified cirrhosis of liver: Secondary | ICD-10-CM | POA: Diagnosis not present

## 2024-03-06 DIAGNOSIS — Z79899 Other long term (current) drug therapy: Secondary | ICD-10-CM | POA: Diagnosis not present

## 2024-03-06 DIAGNOSIS — Z8711 Personal history of peptic ulcer disease: Secondary | ICD-10-CM | POA: Diagnosis not present

## 2024-03-06 DIAGNOSIS — D7581 Myelofibrosis: Secondary | ICD-10-CM | POA: Diagnosis not present

## 2024-03-08 DIAGNOSIS — H524 Presbyopia: Secondary | ICD-10-CM | POA: Diagnosis not present

## 2024-03-08 DIAGNOSIS — H40011 Open angle with borderline findings, low risk, right eye: Secondary | ICD-10-CM | POA: Diagnosis not present

## 2024-03-08 DIAGNOSIS — H43812 Vitreous degeneration, left eye: Secondary | ICD-10-CM | POA: Diagnosis not present

## 2024-03-08 DIAGNOSIS — Z961 Presence of intraocular lens: Secondary | ICD-10-CM | POA: Diagnosis not present

## 2024-03-08 DIAGNOSIS — H52223 Regular astigmatism, bilateral: Secondary | ICD-10-CM | POA: Diagnosis not present

## 2024-03-08 DIAGNOSIS — H5203 Hypermetropia, bilateral: Secondary | ICD-10-CM | POA: Diagnosis not present

## 2024-03-08 DIAGNOSIS — H04123 Dry eye syndrome of bilateral lacrimal glands: Secondary | ICD-10-CM | POA: Diagnosis not present

## 2024-03-14 ENCOUNTER — Ambulatory Visit: Attending: Family | Admitting: Audiologist

## 2024-03-14 DIAGNOSIS — H903 Sensorineural hearing loss, bilateral: Secondary | ICD-10-CM | POA: Insufficient documentation

## 2024-03-14 NOTE — Procedures (Signed)
  [facility] [address] [place], [place]  [address] [phone]  AUDIOLOGICAL  EVALUATION  NAME: Winslet Shears     DOB:   November 03, 1950      MRN: [id number]                                                                                     DATE: 03/14/2024     REFERENT: Alda Amas, PA-C STATUS: Outpatient DIAGNOSIS: Sensorineural Hearing Loss Bilateral    History: Bevely was seen for an audiological evaluation due to a decrease in hearing. Tanishka's family is frustrated with her needing things repeated. She notices she misses people talking to her if she cannot see them. She started noticing this when her daughter pointed it out last Christmas.  Darlisha denies pain, pressure, or tinnitus.  Tere no significant history of hazardous noise exposure.  Medical history shows no additional risk for hearing loss.    Evaluation:  Otoscopy showed a clear slight of the tympanic membranes with cerumen present, bilaterally Tympanometry results were consistent with normal but shallow middle ear function, bilaterally   Audiometric testing was completed using Conventional Audiometry techniques with insert earphones and supraural headphones. Test results are consistent with mild sloping to moderately severe sensorineural hearing loss bilaterally. Speech Recognition Thresholds were obtained at  35dB HL in the right ear and at 40 dB HL in the left ear. Word Recognition Testing was completed at  40dB SL and Royann scored 92% in the right ear and left ear.    Results:  The test results were reviewed with Ronny Colas. She has a sloping sensorineural  hearing loss. She cannot hear high frequency consonants. She needs hearing aids.  Audiogram printed and provided to [place]. Counseled on next steps for obtaining a hearing aid trial.   Recommendations: Hearing aids recommended for both ears. Patient given list of local hearing aid providers.  Annual audiometric  testing recommended to monitor hearing loss for progression.   28 minutes spent testing and counseling on results.   If you have any questions please feel free to contact me at [phone].  Raynald Calkins Stalnaker Au.D.  Audiologist   03/14/2024  9:03 AM  Cc: Allwardt, Deleta Felix, PA-C

## 2024-03-15 ENCOUNTER — Ambulatory Visit: Admitting: Audiologist

## 2024-04-03 ENCOUNTER — Encounter: Payer: Self-pay | Admitting: Podiatry

## 2024-04-03 ENCOUNTER — Ambulatory Visit: Admitting: Podiatry

## 2024-04-03 ENCOUNTER — Ambulatory Visit (INDEPENDENT_AMBULATORY_CARE_PROVIDER_SITE_OTHER)

## 2024-04-03 DIAGNOSIS — S92812D Other fracture of left foot, subsequent encounter for fracture with routine healing: Secondary | ICD-10-CM

## 2024-04-03 NOTE — Progress Notes (Signed)
  Subjective:  Patient ID: Courtney Buckley, female    DOB: 1951/08/05,   MRN: [id number]  Chief Complaint  Patient presents with   Foot Pain    Rm22/ Patient complains of left foot and ankle pain/ started 3 months ago with swelling and aching/no treatment for relief.    73 y.o. female presents for concern of left foot and ankle pain as above. She has been following with Dr. Michalene Agee an and being treated for sesamoid fracture of left foot. Had MRI and has been advised to be in a boot. She relates the toe she does ok as long as she is in sandals but gets pain when barefoot or regular shoes. She also is concerned about the swelling in her ankle has a history of ankle fracture in elementary school. She denies any pain . Denies any other pedal complaints. Denies n/v/f/c.   Past Medical History:  Diagnosis Date   Aneurysm, splenic artery (HCC)    Cancer (HCC)    Cirrhosis (HCC)    Colon polyps    Depression    Esophageal varices (HCC)    Gastric ulcer    Gastritis    Heart murmur    Internal hemorrhoids    Liver cirrhosis (HCC)    Migraine    Myelofibrosis (HCC)    Portal hypertensive gastropathy (HCC)    PVT (portal vein thrombosis)    Thrombocytosis     Objective:  Physical Exam: Vascular: DP/PT pulses 2/4 bilateral. CFT <3 seconds. Normal hair growth on digits. Skin. No lacerations or abrasions bilateral feet.  Musculoskeletal: MMT 5/5 bilateral lower extremities in DF, PF, Inversion and Eversion. Deceased ROM in DF of ankle joint. Tender to plantar medial and lateral sesamoid area on left and pain with ROM of the first MPJ. She denies pain with ROM of the ankle and to palpation. Edema noted to alteral ankle.  Neurological: Sensation intact to light touch.   Assessment:   1. Closed fracture of sesamoid bone of left foot with routine healing, subsequent encounter      Plan:  Patient was evaluated and treated and all questions answered. Discussed sesamoid fracture a  sesamoiditis and ankle swelling and treatment options with patient.  Radiographs reviewed and discussed with patient. Continued diastasis and fracture of medial and lateral sesamoids. No signs of healing noted.  Discussed padding and offloading today.  Discussed causing of swelling and advised on compression for ankle.  Discussed if pain does not improve may consider injection for sesamoids and at some point if pain worsens at some point removal of sesamoids.  Patient to return as needed  Jennefer Moats, DPM

## 2024-06-05 DIAGNOSIS — D7581 Myelofibrosis: Secondary | ICD-10-CM | POA: Diagnosis not present

## 2024-06-05 DIAGNOSIS — Z79899 Other long term (current) drug therapy: Secondary | ICD-10-CM | POA: Diagnosis not present

## 2024-06-19 ENCOUNTER — Ambulatory Visit: Payer: Medicare PPO

## 2024-06-20 ENCOUNTER — Ambulatory Visit: Admitting: Physician Assistant

## 2024-06-20 VITALS — BP 118/76 | HR 75 | Temp 97.7°F | Ht 66.0 in | Wt 177.8 lb

## 2024-06-20 DIAGNOSIS — H6123 Impacted cerumen, bilateral: Secondary | ICD-10-CM | POA: Diagnosis not present

## 2024-06-20 DIAGNOSIS — H9193 Unspecified hearing loss, bilateral: Secondary | ICD-10-CM

## 2024-06-20 NOTE — Progress Notes (Signed)
 Patient ID: Courtney Buckley, female    DOB: Sep 19, 1951, 73 y.o.   MRN: [id number]   Assessment & Plan:  Bilateral hearing loss, unspecified hearing loss type  Bilateral impacted cerumen      Assessment & Plan Impacted cerumen, bilateral Bilateral impacted cerumen identified during hearing test without associated pain, dizziness, discharge, or tinnitus. Requires regular earwax removal every 1-2 years. - Perform earwax removal.  Hearing loss Hearing loss identified during recent hearing test. Recommended for hearing aids in May, but she does not currently have them. Hearing impairment is the primary concern. -F/up with audiology for hearing aids    Ceruminosis is noted. Removal procedure explained and verbal consent obtained from patient. Wax is removed by syringing and manual debridement from bilateral ear canals. TM's clear / normal, noted after wax removal. Pt tolerated procedure well. Instructions for home care to prevent wax buildup are given.    Return if symptoms worsen or fail to improve.    Subjective:    Chief Complaint  Patient presents with   Ear Fullness    Pt in office c/o BIL ear fullness and unable to hear; R ear worse than left; had hearing test two weeks ago and was advised wax build up    HPI Discussed the use of AI scribe software for clinical note transcription with the patient, who gave verbal consent to proceed.  History of Present Illness Courtney Buckley is a 73 year old female who presents with concerns of earwax buildup and hearing impairment.  She underwent a hearing test and was informed that she had a lot of earwax that needed to be removed. Hearing aids were recommended in May, but she does not currently have any.  She typically requires earwax removal every one to two years, depending on her symptoms. Her primary concern today is hearing impairment. No pain, dizziness, discharge, or tinnitus.     Past Medical History:   Diagnosis Date   Aneurysm, splenic artery (HCC)    Cancer (HCC)    Cirrhosis (HCC)    Colon polyps    Depression    Esophageal varices (HCC)    Gastric ulcer    Gastritis    Heart murmur    Internal hemorrhoids    Liver cirrhosis (HCC)    Migraine    Myelofibrosis (HCC)    Portal hypertensive gastropathy (HCC)    PVT (portal vein thrombosis)    Thrombocytosis     Past Surgical History:  Procedure Laterality Date   BIOPSY  09/30/2021   Procedure: BIOPSY;  Surgeon: San Sandor GAILS, DO;  Location: [facility];  Service: Gastroenterology;;   ESOPHAGOGASTRODUODENOSCOPY (EGD) WITH PROPOFOL  N/A 09/30/2021   Procedure: ESOPHAGOGASTRODUODENOSCOPY (EGD) WITH PROPOFOL ;  Surgeon: San Sandor GAILS, DO;  Location: [facility];  Service: Gastroenterology;  Laterality: N/A;   FLEXIBLE SIGMOIDOSCOPY N/A 09/30/2021   Procedure: FLEXIBLE SIGMOIDOSCOPY;  Surgeon: San Sandor GAILS, DO;  Location: [facility];  Service: Gastroenterology;  Laterality: N/A;   IR ANGIOGRAM SELECTIVE EACH ADDITIONAL VESSEL  09/18/2021   IR ANGIOGRAM VISCERAL SELECTIVE  09/18/2021   IR EMBO ARTERIAL NOT HEMORR HEMANG INC GUIDE ROADMAPPING  09/18/2021   IR RADIOLOGIST EVAL & MGMT  09/09/2021   IR US  GUIDE VASC ACCESS RIGHT  09/18/2021   KNEE ARTHROSCOPY Right 09/02/2022   Procedure: Right knee arthroscopy; medial meniscal debridement;  Surgeon: Melodi Lerner, MD;  Location: [facility];  Service: Orthopedics;  Laterality: Right;   LIVER BIOPSY     RADIOLOGY WITH ANESTHESIA N/A  09/18/2021   Procedure: IR WITH ANESTHESIA SPLENIC ARTERY EMBOLIZATION;  Surgeon: Karalee Wilkie POUR, MD;  Location: [facility];  Service: Radiology;  Laterality: N/A;   TONSILLECTOMY      Family History  Problem Relation Age of Onset   Cancer - Lung Father    Diabetes Mellitus II Maternal Aunt    Liver cancer Maternal Aunt    Stomach cancer Maternal Aunt    Diabetes Mellitus II Maternal Uncle    Colon cancer Neg Hx    Rectal cancer Neg  Hx    Esophageal cancer Neg Hx    Liver disease Neg Hx    Pancreatic cancer Neg Hx    Colon polyps Neg Hx     Social History   Tobacco Use   Smoking status: Former    Types: Cigarettes   Smokeless tobacco: Never  Vaping Use   Vaping status: Never Used  Substance Use Topics   Alcohol use: Not Currently    Comment: none now,   Drug use: No     No Known Allergies  Review of Systems NEGATIVE UNLESS OTHERWISE INDICATED IN HPI      Objective:     BP 118/76 (BP Location: Left Arm, Patient Position: Sitting, Cuff Size: Normal)   Pulse 75   Temp 97.7 F (36.5 C) (Temporal)   Ht 5' 6 (1.676 m)   Wt 177 lb 12.8 oz (80.6 kg)   SpO2 97%   BMI 28.70 kg/m   Wt Readings from Last 3 Encounters:  06/20/24 177 lb 12.8 oz (80.6 kg)  02/22/24 180 lb 3.2 oz (81.7 kg)  01/10/24 195 lb (88.5 kg)    BP Readings from Last 3 Encounters:  06/20/24 118/76  02/22/24 113/69  01/10/24 124/82     Physical Exam Vitals and nursing note reviewed.  Constitutional:      Appearance: Normal appearance.  HENT:     Right Ear: Tympanic membrane and external ear normal. There is impacted cerumen.     Left Ear: Tympanic membrane and external ear normal. There is impacted cerumen.  Eyes:     Extraocular Movements: Extraocular movements intact.     Conjunctiva/sclera: Conjunctivae normal.     Pupils: Pupils are equal, round, and reactive to light.  Neurological:     General: No focal deficit present.     Mental Status: She is alert and oriented to person, place, and time.  Psychiatric:        Mood and Affect: Mood normal.             Tyteanna Ost M Dora Clauss, PA-C

## 2024-06-22 ENCOUNTER — Ambulatory Visit (INDEPENDENT_AMBULATORY_CARE_PROVIDER_SITE_OTHER)

## 2024-06-22 VITALS — Ht 66.0 in | Wt 177.0 lb

## 2024-06-22 DIAGNOSIS — Z0000 Encounter for general adult medical examination without abnormal findings: Secondary | ICD-10-CM | POA: Diagnosis not present

## 2024-06-22 NOTE — Progress Notes (Signed)
 Subjective:   Courtney Buckley is a 73 y.o. who presents for a Medicare Wellness preventive visit.  As a reminder, Annual Wellness Visits don't include a physical exam, and some assessments may be limited, especially if this visit is performed virtually. We may recommend an in-person follow-up visit with your provider if needed.  Visit Complete: Virtual I connected with  [name] on 06/22/24 by a audio enabled telemedicine application and verified that I am speaking with the correct person using two identifiers.  Patient Location: Home  Provider Location: Office/Clinic  I discussed the limitations of evaluation and management by telemedicine. The patient expressed understanding and agreed to proceed.  Vital Signs: Because this visit was a virtual/telehealth visit, some criteria may be missing or patient reported. Any vitals not documented were not able to be obtained and vitals that have been documented are patient reported.  VideoDeclined- This patient declined Librarian, academic. Therefore the visit was completed with audio only.  Persons Participating in Visit: Patient.  AWV Questionnaire: No: Patient Medicare AWV questionnaire was not completed prior to this visit.  Cardiac Risk Factors include: advanced age (>69men, >53 women)     Objective:    Today's Vitals   06/22/24 0927  Weight: 177 lb (80.3 kg)  Height: 5' 6 (1.676 m)   Body mass index is 28.57 kg/m.     06/22/2024    9:35 AM 10/12/2023    1:16 PM 06/14/2023   10:36 AM 08/31/2022    1:29 PM 06/08/2022   10:48 AM 11/24/2021    2:03 PM 09/29/2021   10:14 PM  Advanced Directives  Does Patient Have a Medical Advance Directive? Yes Yes Yes Yes Yes No Yes  Type of Estate agent of [place];Living will Healthcare Power of [employer] of [place];Living will  Healthcare Power of [employer] Power of Attorney  Does patient want to  make changes to medical advance directive?  No - Patient declined  No - Patient declined   No - Patient declined  Copy of Healthcare Power of Attorney in Chart? No - copy requested No - copy requested No - copy requested  No - copy requested  No - copy requested  Would patient like information on creating a medical advance directive?      No - Patient declined No - Patient declined    Current Medications (verified) Outpatient Encounter Medications as of 06/22/2024  Medication Sig   JAKAFI  10 MG tablet Take 10 mg by mouth 2 (two) times daily.   meloxicam  (MOBIC ) 7.5 MG tablet TAKE 1 TABLET BY MOUTH EVERY DAY   ondansetron  (ZOFRAN -ODT) 4 MG disintegrating tablet Take 1 by mouth every 6-8 hours as needed for nausea   pantoprazole  (PROTONIX ) 40 MG tablet TAKE 1 TABLET BY MOUTH TWICE A DAY   [DISCONTINUED] prednisoLONE  acetate (PRED FORTE ) 1 % ophthalmic suspension 1 drop 4 (four) times daily. (Patient not taking: Reported on 06/20/2024)   No facility-administered encounter medications on file as of 06/22/2024.    Allergies (verified) Patient has no known allergies.   History: Past Medical History:  Diagnosis Date   Aneurysm, splenic artery (HCC)    Cancer (HCC)    Cirrhosis (HCC)    Colon polyps    Depression    Esophageal varices (HCC)    Gastric ulcer    Gastritis    Heart murmur    Internal hemorrhoids    Liver cirrhosis (HCC)    Migraine  Myelofibrosis (HCC)    Portal hypertensive gastropathy (HCC)    PVT (portal vein thrombosis)    Thrombocytosis    Past Surgical History:  Procedure Laterality Date   BIOPSY  09/30/2021   Procedure: BIOPSY;  Surgeon: San Sandor GAILS, DO;  Location: [facility];  Service: Gastroenterology;;   ESOPHAGOGASTRODUODENOSCOPY (EGD) WITH PROPOFOL  N/A 09/30/2021   Procedure: ESOPHAGOGASTRODUODENOSCOPY (EGD) WITH PROPOFOL ;  Surgeon: San Sandor GAILS, DO;  Location: [facility];  Service: Gastroenterology;  Laterality: N/A;   FLEXIBLE  SIGMOIDOSCOPY N/A 09/30/2021   Procedure: FLEXIBLE SIGMOIDOSCOPY;  Surgeon: San Sandor GAILS, DO;  Location: [facility];  Service: Gastroenterology;  Laterality: N/A;   IR ANGIOGRAM SELECTIVE EACH ADDITIONAL VESSEL  09/18/2021   IR ANGIOGRAM VISCERAL SELECTIVE  09/18/2021   IR EMBO ARTERIAL NOT HEMORR HEMANG INC GUIDE ROADMAPPING  09/18/2021   IR RADIOLOGIST EVAL & MGMT  09/09/2021   IR US  GUIDE VASC ACCESS RIGHT  09/18/2021   KNEE ARTHROSCOPY Right 09/02/2022   Procedure: Right knee arthroscopy; medial meniscal debridement;  Surgeon: Melodi Lerner, MD;  Location: [facility];  Service: Orthopedics;  Laterality: Right;   LIVER BIOPSY     RADIOLOGY WITH ANESTHESIA N/A 09/18/2021   Procedure: IR WITH ANESTHESIA SPLENIC ARTERY EMBOLIZATION;  Surgeon: Karalee Wilkie POUR, MD;  Location: [facility];  Service: Radiology;  Laterality: N/A;   TONSILLECTOMY     Family History  Problem Relation Age of Onset   Cancer - Lung Father    Diabetes Mellitus II Maternal Aunt    Liver cancer Maternal Aunt    Stomach cancer Maternal Aunt    Diabetes Mellitus II Maternal Uncle    Colon cancer Neg Hx    Rectal cancer Neg Hx    Esophageal cancer Neg Hx    Liver disease Neg Hx    Pancreatic cancer Neg Hx    Colon polyps Neg Hx    Social History   Socioeconomic History   Marital status: Significant Other    Spouse name: Not on file   Number of children: 2   Years of education: Not on file   Highest education level: Bachelor's degree (e.g., BA, AB, BS)  Occupational History   Not on file  Tobacco Use   Smoking status: Former    Types: Cigarettes   Smokeless tobacco: Never  Vaping Use   Vaping status: Never Used  Substance and Sexual Activity   Alcohol use: Not Currently    Comment: none now,   Drug use: No   Sexual activity: Not Currently  Other Topics Concern   Not on file  Social History Narrative   Right handed   Bachelors degree   Occasionally caffeine   2 children   Social Drivers of  Corporate investment banker Strain: Low Risk  (06/22/2024)   Overall Financial Resource Strain (CARDIA)    Difficulty of Paying Living Expenses: Not hard at all  Food Insecurity: No Food Insecurity (06/22/2024)   Hunger Vital Sign    Worried About Running Out of Food in the Last Year: Never true    Ran Out of Food in the Last Year: Never true  Transportation Needs: No Transportation Needs (06/22/2024)   PRAPARE - Administrator, Civil Service (Medical): No    Lack of Transportation (Non-Medical): No  Physical Activity: Insufficiently Active (06/22/2024)   Exercise Vital Sign    Days of Exercise per Week: 3 days    Minutes of Exercise per Session: 20 min  Stress: No Stress  Concern Present (06/22/2024)   [employer] of Occupational Health - Occupational Stress Questionnaire    Feeling of Stress: Not at all  Social Connections: Socially Isolated (06/22/2024)   Social Connection and Isolation Panel    Frequency of Communication with Friends and Family: More than three times a week    Frequency of Social Gatherings with Friends and Family: More than three times a week    Attends Religious Services: Never    Database administrator or Organizations: No    Attends Engineer, structural: Never    Marital Status: Separated    Tobacco Counseling Counseling given: Not Answered    Clinical Intake:  Pre-visit preparation completed: Yes  Pain : No/denies pain     BMI - recorded: 28.57 Nutritional Status: BMI 25 -29 Overweight Nutritional Risks: None Diabetes: No  No results found for: HGBA1C   How often do you need to have someone help you when you read instructions, pamphlets, or other written materials from your doctor or pharmacy?: 1 - Never  Interpreter Needed?: No  Information entered by :: Ellouise Haws, LPN   Activities of Daily Living      06/22/2024    9:29 AM  In your present state of health, do you have any difficulty performing the  following activities:  Hearing? 0  Vision? 0  Difficulty concentrating or making decisions? 0  Walking or climbing stairs? 1  Comment related to knees  Dressing or bathing? 0  Doing errands, shopping? 0  Preparing Food and eating ? N  Using the Toilet? N  In the past six months, have you accidently leaked urine? Y  Comment at times and wears a pad  Do you have problems with loss of bowel control? N  Managing your Medications? N  Managing your Finances? N  Housekeeping or managing your Housekeeping? N    Patient Care Team: Allwardt, Alyssa M, PA-C as PCP - General (Physician Assistant)  I have updated your Care Teams any recent Medical Services you may have received from other providers in the past year.     Assessment:   This is a routine wellness examination for Deania.  Hearing/Vision screen Hearing Screening - Comments:: Pt denies any hearing issues  Vision Screening - Comments:: Wears rx glasses - up to date with routine eye exams with Dr Coralee    Goals Addressed             This Visit's Progress    Patient Stated       Work on Walking better       Depression Screen     06/22/2024    9:31 AM 06/20/2024   11:21 AM 02/22/2024   12:43 PM 08/09/2023    9:42 AM 08/09/2023    9:00 AM 06/14/2023   10:35 AM 06/08/2022   10:47 AM  PHQ 2/9 Scores  PHQ - 2 Score 1 0 0 0 0 0 0  PHQ- 9 Score 1 3 0 3       Fall Risk     06/22/2024    9:34 AM 06/20/2024   11:21 AM 10/12/2023    1:16 PM 08/09/2023    9:00 AM 06/14/2023   10:38 AM  Fall Risk   Falls in the past year? 1 1 1 1 1   Number falls in past yr: 1 1 1 1 1   Injury with Fall? 1 1 1 1 1   Comment brusied    left foot and left hand  Risk  for fall due to : Impaired balance/gait;Impaired mobility;History of fall(s) No Fall Risks History of fall(s) History of fall(s) History of fall(s);Impaired balance/gait;Impaired vision  Follow up Falls prevention discussed Falls evaluation completed Falls evaluation completed Falls  evaluation completed Falls prevention discussed    MEDICARE RISK AT HOME:  Medicare Risk at Home Any stairs in or around the home?: No If so, are there any without handrails?: No Home free of loose throw rugs in walkways, pet beds, electrical cords, etc?: Yes Adequate lighting in your home to reduce risk of falls?: Yes Life alert?: No Use of a cane, walker or w/c?: No Grab bars in the bathroom?: Yes Shower chair or bench in shower?: No Elevated toilet seat or a handicapped toilet?: No  TIMED UP AND GO:  Was the test performed?  No  Cognitive Function: 6CIT completed      10/12/2023    1:00 PM  Montreal Cognitive Assessment   Visuospatial/ Executive (0/5) 1  Naming (0/3) 3  Attention: Read list of digits (0/2) 2  Attention: Read list of letters (0/1) 1  Attention: Serial 7 subtraction starting at 100 (0/3) 1  Language: Repeat phrase (0/2) 2  Language : Fluency (0/1) 1  Abstraction (0/2) 2  Delayed Recall (0/5) 4  Orientation (0/6) 6  Total 23  Adjusted Score (based on education) 23      06/22/2024    9:35 AM 06/14/2023   10:38 AM 06/08/2022   10:52 AM 05/26/2021   11:23 AM  6CIT Screen  What Year? 0 points 0 points 0 points 0 points  What month? 0 points 0 points 0 points 0 points  What time? 0 points 0 points 0 points 0 points  Count back from 20 0 points 0 points 0 points 0 points  Months in reverse 0 points 0 points 0 points 0 points  Repeat phrase 0 points 0 points 4 points 0 points  Total Score 0 points 0 points 4 points 0 points    Immunizations Immunization History  Administered Date(s) Administered   Fluad Quad(high Dose 65+) 07/25/2020, 07/04/2022   Fluzone Influenza virus vaccine,trivalent (IIV3), split virus 09/03/2015, 08/18/2016, 07/15/2017, 08/10/2019   Hepatitis A, Ped/Adol-2 Dose 05/07/2010   Hepatitis B, ADULT 05/07/2010   Hepb-cpg 06/02/2019, 07/27/2019   Influenza, High Dose Seasonal PF 08/05/2018, 08/10/2019, 07/12/2023   Moderna Covid-19  Fall Seasonal Vaccine 76yrs & older 01/11/2023, 07/12/2023   PFIZER Comirnaty(Gray Top)Covid-19 Tri-Sucrose Vaccine 02/05/2021, 08/03/2022   PFIZER(Purple Top)SARS-COV-2 Vaccination 12/15/2019, 01/09/2020, 07/30/2020   PNEUMOCOCCAL CONJUGATE-20 05/25/2022, 07/26/2023   Pneumococcal Conjugate,unspecified 05/25/2022   Pneumococcal Conjugate-13 11/13/2015   Pneumococcal Polysaccharide-23 05/09/2012   Respiratory Syncytial Virus Vaccine,Recomb Aduvanted(Arexvy) 07/04/2022   Tdap 08/27/2008   Zoster Recombinant(Shingrix) 08/10/2019, 05/04/2020   Zoster, Live 08/10/2019    Screening Tests Health Maintenance  Topic Date Due   COVID-19 Vaccine (8 - Pfizer risk 2024-25 season) 07/06/2024 (Originally 01/09/2024)   MAMMOGRAM  08/08/2024 (Originally 08/08/2023)   INFLUENZA VACCINE  01/30/2025 (Originally 06/02/2024)   Medicare Annual Wellness (AWV)  06/22/2025   Pneumococcal Vaccine: 50+ Years  Completed   DEXA SCAN  Completed   Hepatitis C Screening  Completed   Zoster Vaccines- Shingrix  Completed   HPV VACCINES  Aged Out   Meningococcal B Vaccine  Aged Out   DTaP/Tdap/Td  Discontinued   Hepatitis B Vaccines 19-59 Average Risk  Discontinued    Health Maintenance  There are no preventive care reminders to display for this patient.  Health Maintenance Items Addressed:  See Nurse Notes at the end of this note  Additional Screening:  Vision Screening: Recommended annual ophthalmology exams for early detection of glaucoma and other disorders of the eye. Would you like a referral to an eye doctor? No    Dental Screening: Recommended annual dental exams for proper oral hygiene  Community Resource Referral / Chronic Care Management: CRR required this visit?  No   CCM required this visit?  No   Plan:    I have personally reviewed and noted the following in the patient's chart:   Medical and social history Use of alcohol, tobacco or illicit drugs  Current medications and supplements  including opioid prescriptions. Patient is not currently taking opioid prescriptions. Functional ability and status Nutritional status Physical activity Advanced directives List of other physicians Hospitalizations, surgeries, and ER visits in previous 12 months Vitals Screenings to include cognitive, depression, and falls Referrals and appointments  In addition, I have reviewed and discussed with patient certain preventive protocols, quality metrics, and best practice recommendations. A written personalized care plan for preventive services as well as general preventive health recommendations were provided to patient.   Ellouise VEAR Haws, LPN   1/[id number]/7974   After Visit Summary: (MyChart) Due to this being a telephonic visit, the after visit summary with patients personalized plan was offered to patient via MyChart   Notes: Nothing significant to report at this time.

## 2024-06-22 NOTE — Patient Instructions (Signed)
 Ms. Mcelhinny , Thank you for taking time out of your busy schedule to complete your Annual Wellness Visit with me. I enjoyed our conversation and look forward to speaking with you again next year. I, as well as your care team,  appreciate your ongoing commitment to your health goals. Please review the following plan we discussed and let me know if I can assist you in the future. Your Game plan/ To Do List    Referrals: If you haven't heard from the office you've been referred to, please reach out to them at the phone provided.   Follow up Visits: We will see or speak with you next year for your Next Medicare AWV with our clinical staff Have you seen your provider in the last 6 months (3 months if uncontrolled diabetes)? Yes  Clinician Recommendations:  Aim for 30 minutes of exercise or brisk walking, 6-8 glasses of water, and 5 servings of fruits and vegetables each day.       This is a list of the screenings recommended for you:  Health Maintenance  Topic Date Due   Medicare Annual Wellness Visit  06/13/2024   COVID-19 Vaccine (8 - Pfizer risk 2024-25 season) 07/06/2024*   Mammogram  08/08/2024*   Flu Shot  01/30/2025*   Pneumococcal Vaccine for age over 25  Completed   DEXA scan (bone density measurement)  Completed   Hepatitis C Screening  Completed   Zoster (Shingles) Vaccine  Completed   HPV Vaccine  Aged Out   Meningitis B Vaccine  Aged Out   DTaP/Tdap/Td vaccine  Discontinued   Hepatitis B Vaccine  Discontinued  *Topic was postponed. The date shown is not the original due date.    Advanced directives: (Copy Requested) Please bring a copy of your health care power of attorney and living will to the office to be added to your chart at your convenience. You can mail to [facility] [address] [place], [place] [address] or email to [email or web address] Advance Care Planning is important because it:  [x]  Makes sure you receive the medical care that  is consistent with your values, goals, and preferences  [x]  It provides guidance to your family and loved ones and reduces their decisional burden about whether or not they are making the right decisions based on your wishes.  Follow the link provided in your after visit summary or read over the paperwork we have mailed to you to help you started getting your Advance Directives in place. If you need assistance in completing these, please reach out to us  so that we can help you!  See attachments for Preventive Care and Fall Prevention Tips.

## 2024-08-07 ENCOUNTER — Encounter: Payer: Self-pay | Admitting: Internal Medicine

## 2024-08-29 ENCOUNTER — Encounter: Payer: Self-pay | Admitting: Physician Assistant

## 2024-08-29 ENCOUNTER — Ambulatory Visit: Admitting: Physician Assistant

## 2024-08-29 VITALS — BP 100/58 | HR 70 | Temp 98.4°F | Ht 66.0 in | Wt 175.4 lb

## 2024-08-29 DIAGNOSIS — D7581 Myelofibrosis: Secondary | ICD-10-CM | POA: Diagnosis not present

## 2024-08-29 DIAGNOSIS — R634 Abnormal weight loss: Secondary | ICD-10-CM | POA: Diagnosis not present

## 2024-08-29 DIAGNOSIS — R051 Acute cough: Secondary | ICD-10-CM | POA: Diagnosis not present

## 2024-08-29 MED ORDER — GUAIFENESIN-CODEINE 100-10 MG/5ML PO SOLN: 5.0000 mL | Freq: Four times a day (QID) | ORAL | 0 refills | Status: AC | PRN

## 2024-08-29 NOTE — Progress Notes (Signed)
 Patient ID: Courtney Buckley, female    DOB: [birth year], 73 y.o.   MRN: [id number]   Assessment & Plan:  Acute cough  Myelofibrosis (HCC)  Unintended weight loss  Other orders -     guaiFENesin -Codeine ; Take 5 mLs by mouth every 6 (six) hours as needed for cough.  Dispense: 120 mL; Refill: 0      Assessment and Plan Assessment & Plan Acute cough Persistent dry cough for two weeks, likely viral in origin. No fever, sore throat, or significant phlegm production. Cough is improving but still present. No asthma or COPD. Lungs clear on examination. Concern about potential COVID-19 exposure due to upcoming care responsibilities for ex-husband undergoing bypass surgery. - Prescribe codeine  cough syrup for nighttime symptom management. - Advise rest and increased fluid intake. - Suggest honey during the day to soothe the throat. - Instruct to contact if symptoms worsen or if a chest x-ray is desired.  Unintentional weight loss Reported 20-pound weight loss over the past year without significant changes in diet or exercise. Decreased appetite and smaller portions. No significant energy loss or other concerning symptoms. Follow-up with hematologist Dr. Reyna scheduled for the second week of November. - Ensure hematologist Dr. Reyna is aware of the weight loss during the upcoming appointment.  Myelofibrosis - Follows with Dr. Reyna - Jakafi  10 mg BID     F/up prn    Subjective:    Chief Complaint  Patient presents with   Cough    Pt in office c/o chronic cough; pt states started with small cold 2.5 wks ago, cough non productive and now still lingering. Asking for cough syrup with codeine  to help get through. 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 Mammogram scheduled in December per patient    HPI Discussed the use of AI scribe software for clinical note transcription with the patient, who gave verbal consent to proceed.  History  of Present Illness Courtney Buckley is a 73 year old female with myelofibrosis who presents with a persistent cough.  She has been experiencing a persistent dry cough for two weeks, occasionally producing a small amount of phlegm in the mornings. The cough sometimes leads to a sensation of being unable to breathe, particularly after eating. The cough began after contracting a cold from her grandchildren, characterized by a runny nose but no sore throat or fever. Over-the-counter medications like Robitussin were ineffective and made her feel unwell. She has not had any recent chest imaging since extensive evaluations a few years ago.  She is concerned about the cough due to her role as a caregiver for her ex-husband, who is undergoing bypass surgery soon. She reports no fever or night sweats and has no history of asthma or COPD.  She has experienced a weight loss of twenty pounds over the past year without significant changes in diet or exercise, attributing this to decreased appetite and reduced portion sizes. Despite the weight loss, she reports feeling generally well with good energy levels.  She has a history of panic attacks, which she manages with magnesium and gummies for sleep, though she finds the current regimen only partially effective. She lives alone and experiences significant anxiety during panic attacks.     Past Medical History:  Diagnosis Date   Aneurysm, splenic artery    Cancer (HCC)    Cirrhosis (HCC)    Colon polyps    Depression    Esophageal varices (HCC)  Gastric ulcer    Gastritis    Heart murmur    Internal hemorrhoids    Liver cirrhosis (HCC)    Migraine    Myelofibrosis (HCC)    Portal hypertensive gastropathy (HCC)    PVT (portal vein thrombosis)    Thrombocytosis     Past Surgical History:  Procedure Laterality Date   BIOPSY  09/30/2021   Procedure: BIOPSY;  Surgeon: San Sandor GAILS, DO;  Location: [facility];  Service: Gastroenterology;;    ESOPHAGOGASTRODUODENOSCOPY (EGD) WITH PROPOFOL  N/A 09/30/2021   Procedure: ESOPHAGOGASTRODUODENOSCOPY (EGD) WITH PROPOFOL ;  Surgeon: San Sandor GAILS, DO;  Location: [facility];  Service: Gastroenterology;  Laterality: N/A;   FLEXIBLE SIGMOIDOSCOPY N/A 09/30/2021   Procedure: FLEXIBLE SIGMOIDOSCOPY;  Surgeon: San Sandor GAILS, DO;  Location: [facility];  Service: Gastroenterology;  Laterality: N/A;   IR ANGIOGRAM SELECTIVE EACH ADDITIONAL VESSEL  09/18/2021   IR ANGIOGRAM VISCERAL SELECTIVE  09/18/2021   IR EMBO ARTERIAL NOT HEMORR HEMANG INC GUIDE ROADMAPPING  09/18/2021   IR RADIOLOGIST EVAL & MGMT  09/09/2021   IR US  GUIDE VASC ACCESS RIGHT  09/18/2021   KNEE ARTHROSCOPY Right 09/02/2022   Procedure: Right knee arthroscopy; medial meniscal debridement;  Surgeon: Melodi Lerner, MD;  Location: [facility];  Service: Orthopedics;  Laterality: Right;   LIVER BIOPSY     RADIOLOGY WITH ANESTHESIA N/A 09/18/2021   Procedure: IR WITH ANESTHESIA SPLENIC ARTERY EMBOLIZATION;  Surgeon: Karalee Wilkie POUR, MD;  Location: [facility];  Service: Radiology;  Laterality: N/A;   TONSILLECTOMY      Family History  Problem Relation Age of Onset   Cancer - Lung Father    Diabetes Mellitus II Maternal Aunt    Liver cancer Maternal Aunt    Stomach cancer Maternal Aunt    Diabetes Mellitus II Maternal Uncle    Colon cancer Neg Hx    Rectal cancer Neg Hx    Esophageal cancer Neg Hx    Liver disease Neg Hx    Pancreatic cancer Neg Hx    Colon polyps Neg Hx     Social History   Tobacco Use   Smoking status: Former    Types: Cigarettes   Smokeless tobacco: Never  Vaping Use   Vaping status: Never Used  Substance Use Topics   Alcohol use: Not Currently    Comment: none now,   Drug use: No     No Known Allergies  Review of Systems NEGATIVE UNLESS OTHERWISE INDICATED IN HPI      Objective:     BP (!) 100/58 (BP Location: Left Arm, Patient Position: Sitting, Cuff Size: Normal)   Pulse  70   Temp 98.4 F (36.9 C) (Temporal)   Ht 5' 6 (1.676 m)   Wt 175 lb 6.4 oz (79.6 kg)   SpO2 96%   BMI 28.31 kg/m   Wt Readings from Last 3 Encounters:  08/29/24 175 lb 6.4 oz (79.6 kg)  06/22/24 177 lb (80.3 kg)  06/20/24 177 lb 12.8 oz (80.6 kg)    BP Readings from Last 3 Encounters:  08/29/24 (!) 100/58  06/20/24 118/76  02/22/24 113/69     Physical Exam Vitals and nursing note reviewed.  Constitutional:      General: She is not in acute distress.    Appearance: Normal appearance. She is not ill-appearing.  HENT:     Head: Normocephalic.     Right Ear: Tympanic membrane, ear canal and external ear normal.     Left Ear: Tympanic membrane,  ear canal and external ear normal.     Nose: No congestion.     Mouth/Throat:     Mouth: Mucous membranes are moist.     Pharynx: No oropharyngeal exudate or posterior oropharyngeal erythema.  Eyes:     Extraocular Movements: Extraocular movements intact.     Conjunctiva/sclera: Conjunctivae normal.     Pupils: Pupils are equal, round, and reactive to light.  Cardiovascular:     Rate and Rhythm: Normal rate and regular rhythm.     Pulses: Normal pulses.     Heart sounds: Normal heart sounds. No murmur heard. Pulmonary:     Effort: Pulmonary effort is normal. No respiratory distress.     Breath sounds: Normal breath sounds. No wheezing.  Musculoskeletal:     Cervical back: Normal range of motion.  Skin:    General: Skin is warm.  Neurological:     Mental Status: She is alert and oriented to person, place, and time.  Psychiatric:        Mood and Affect: Mood normal.        Behavior: Behavior normal.             Monay Houlton M Arsenia Goracke, PA-C

## 2024-09-06 DIAGNOSIS — H04123 Dry eye syndrome of bilateral lacrimal glands: Secondary | ICD-10-CM | POA: Diagnosis not present

## 2024-09-06 DIAGNOSIS — H43812 Vitreous degeneration, left eye: Secondary | ICD-10-CM | POA: Diagnosis not present

## 2024-09-06 DIAGNOSIS — H5203 Hypermetropia, bilateral: Secondary | ICD-10-CM | POA: Diagnosis not present

## 2024-09-06 DIAGNOSIS — Z961 Presence of intraocular lens: Secondary | ICD-10-CM | POA: Diagnosis not present

## 2024-09-06 DIAGNOSIS — H52223 Regular astigmatism, bilateral: Secondary | ICD-10-CM | POA: Diagnosis not present

## 2024-09-06 DIAGNOSIS — H40011 Open angle with borderline findings, low risk, right eye: Secondary | ICD-10-CM | POA: Diagnosis not present

## 2024-09-11 ENCOUNTER — Encounter: Payer: Self-pay | Admitting: Psychology

## 2024-09-11 DIAGNOSIS — K259 Gastric ulcer, unspecified as acute or chronic, without hemorrhage or perforation: Secondary | ICD-10-CM | POA: Diagnosis not present

## 2024-09-11 DIAGNOSIS — D7581 Myelofibrosis: Secondary | ICD-10-CM | POA: Diagnosis not present

## 2024-09-11 DIAGNOSIS — Z79899 Other long term (current) drug therapy: Secondary | ICD-10-CM | POA: Diagnosis not present

## 2024-09-11 DIAGNOSIS — K7460 Unspecified cirrhosis of liver: Secondary | ICD-10-CM | POA: Diagnosis not present

## 2024-09-12 ENCOUNTER — Encounter: Payer: Self-pay | Admitting: Psychology

## 2024-09-12 ENCOUNTER — Ambulatory Visit: Payer: Self-pay | Admitting: Psychology

## 2024-09-12 ENCOUNTER — Ambulatory Visit (INDEPENDENT_AMBULATORY_CARE_PROVIDER_SITE_OTHER): Payer: Medicare PPO | Admitting: Psychology

## 2024-09-12 DIAGNOSIS — G3184 Mild cognitive impairment, so stated: Secondary | ICD-10-CM | POA: Diagnosis not present

## 2024-09-12 DIAGNOSIS — R41840 Attention and concentration deficit: Secondary | ICD-10-CM | POA: Insufficient documentation

## 2024-09-12 DIAGNOSIS — G4700 Insomnia, unspecified: Secondary | ICD-10-CM | POA: Insufficient documentation

## 2024-09-12 DIAGNOSIS — F0670 Mild neurocognitive disorder due to known physiological condition without behavioral disturbance: Secondary | ICD-10-CM | POA: Diagnosis not present

## 2024-09-12 DIAGNOSIS — R4189 Other symptoms and signs involving cognitive functions and awareness: Secondary | ICD-10-CM

## 2024-09-12 NOTE — Progress Notes (Signed)
 NEUROPSYCHOLOGICAL EVALUATION [place]. [facility] Department of Neurology  Date of Evaluation: September 12, 2024  Reason for Referral:   Courtney Buckley is a 73 y.o. right-handed Caucasian female referred by Camie Sevin, PA-C, to characterize her current cognitive functioning and assist with diagnostic clarity and treatment planning in the context of subjective cognitive decline.   Assessment and Plan:   Clinical Impression(s): Courtney Buckley's pattern of performance is suggestive of primary dysfunction surrounding sustained attention, visuospatial abilities (outside of her drawing of a clock which was appropriate), and encoding (i.e., learning) aspects of memory. Further performance variability was exhibited across both delayed retrieval and recognition/consolidation aspects of memory. An isolated impairment was exhibited across a task assessing abstract reasoning; however, all other executive functioning tasks were appropriate. Performances were also appropriate relative to age-matched peers across processing speed, basic attention, working memory, and both receptive and expressive language. Functionally, Courtney Buckley denied difficulties completing instrumental activities of daily living (ADLs) independently. 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  The etiology for ongoing dysfunction is unclear at the present time. During interview, Courtney Buckley described her most salient concern surrounding attention/concentration and distractibility. Across a continuous performance task, her performances suggested significant difficulties with inattentiveness and sustained attention, supporting her observations. This task suggested a high likelihood of having a disorder characterized by attention deficits. It is important to highlight that,  per her report, attentional difficulties only became noticeable following her myelofibrosis diagnosis and subsequent treatment approximately 5 years prior. She did not report prominent symptoms in early childhood or adolescence during the current interview. Given the lack of documented symptoms at a young age, she would not meet diagnostic criteria for ADHD. Current difficulties may mirror difficulties experienced by individuals with this diagnosis. However, they may be more likely related to her medical and pharmacological history rather than the presence of a neurodevelopmental disorder. Regular THC use and prominent sleep dysfunction will also worsen attentional abilities, as will day-to-day psychosocial stressors and generalized anxious distress.   There is not compelling data to warrant advanced concern for a neurodegenerative illness at the present time. Despite some weakness across memory testing, retention rates ranged from 77% to 125% across four memory tasks. This does not suggest concern for rapid forgetting, making symptomatic Alzheimer's disease less likely presently. Visuospatial dysfunction appears largely due to attentional dysfunction. Points were lost across her copy of a complex figure due to her exhibiting a sloppy and somewhat careless approach, ultimately omitting several internal aspects entirely. She does not display behavioral characteristics to warrant advanced concern for Lewy body disease, another more rare parkinsonian condition, or frontotemporal lobar degeneration presently. As there is no neuroimaging, I cannot comment on the likelihood of a cerebrovascular contribution to her current presentation.   Recommendations: Should there be progression of current deficits over time, Courtney Buckley is unlikely to regain any independent living skills lost. Therefore, it is recommended that she remain as involved as possible in all aspects of household chores, finances, and medication  management, with supervision to ensure adequate performance. She will likely benefit from the establishment and maintenance of a routine in order to maximize her functional abilities over time.  It may be beneficial for Courtney Buckley to have another person with her when in situations where she may need to process information, weigh the pros and cons of different options, and make decisions, in order to ensure  that she fully understands and recalls all information to be considered.  Performance across neurocognitive testing is not a strong predictor of an individual's safety operating a motor vehicle. Should her family wish to pursue a formalized driving evaluation, they could reach out to the following agencies: [facility] [employer] [facility] in [place]: [phone] [facility]: [phone] [facility]: [phone] [facility]: [phone] or [phone]  Courtney Buckley is encouraged to attend to lifestyle factors for brain health (e.g., regular physical exercise, good nutrition habits and consideration of the MIND-DASH diet, regular participation in cognitively-stimulating activities, and general stress management techniques), which are likely to have benefits for both emotional adjustment and cognition. In fact, in addition to promoting good general health, regular exercise incorporating aerobic activities (e.g., brisk walking, jogging, cycling, etc.) has been demonstrated to be a very effective treatment for depression and stress, with similar efficacy rates to both antidepressant medication and psychotherapy. Optimal control of vascular risk factors (including safe cardiovascular exercise and adherence to dietary recommendations) is encouraged. Continued participation in activities which provide mental stimulation and social interaction is also recommended.   Memory can be improved using internal strategies such as rehearsal, repetition, chunking, mnemonics,  association, and imagery. External strategies such as written notes in a consistently used memory journal, visual and nonverbal auditory cues such as a calendar on the refrigerator or appointments with alarm, such as on a cell phone, can also help maximize recall.    When learning new information, she would benefit from information being broken up into small, manageable pieces. She may also find it helpful to articulate the material in her own words and in a context to promote encoding at the onset of a new task. This material may need to be repeated multiple times to promote encoding.  To address problems with fluctuating attention and/or executive dysfunction, she may wish to consider:   -Avoiding external distractions when needing to concentrate   -Limiting exposure to fast paced environments with multiple sensory demands   -Writing down complicated information and using checklists   -Attempting and completing one task at a time (i.e., no multi-tasking)   -Verbalizing aloud each step of a task to maintain focus   -Taking frequent breaks during the completion of steps/tasks to avoid fatigue   -Reducing the amount of information considered at one time   -Scheduling more difficult activities for a time of day where she is usually most alert  Review of Records:   Past Medical History:  Diagnosis Date   Acute blood loss anemia    Acute medial meniscal tear 09/02/2022   Arthralgia of right knee 06/29/2022   Body mass index (BMI) 33.0-33.9, adult 08/09/2023   Cirrhosis of liver 04/04/2022   Colitis 09/29/2021   Colon polyps    Esophageal varices    Functional dyspepsia 08/09/2023   Gastric ulcer without hemorrhage or perforation    Gastritis    Grade II internal hemorrhoids    Heart murmur    Hyperbilirubinemia 08/09/2023   Insomnia    Internal hemorrhoids    Iron deficiency anemia 08/09/2023   Leukocytosis 09/29/2021   Major depression in remission 08/09/2023   Migraine with aura  08/09/2023   Myelofibrosis    Myelofibrosis 10/04/2020   Portal hypertensive gastropathy    PVT (portal vein thrombosis)    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   Systolic murmur 08/09/2023   Thrombocytosis     Past Surgical History:  Procedure Laterality Date   BIOPSY  09/30/2021   Procedure:  BIOPSY;  Surgeon: San Sandor GAILS, DO;  Location: [facility];  Service: Gastroenterology;;   ESOPHAGOGASTRODUODENOSCOPY (EGD) WITH PROPOFOL  N/A 09/30/2021   Procedure: ESOPHAGOGASTRODUODENOSCOPY (EGD) WITH PROPOFOL ;  Surgeon: San Sandor GAILS, DO;  Location: [facility];  Service: Gastroenterology;  Laterality: N/A;   FLEXIBLE SIGMOIDOSCOPY N/A 09/30/2021   Procedure: FLEXIBLE SIGMOIDOSCOPY;  Surgeon: San Sandor GAILS, DO;  Location: [facility];  Service: Gastroenterology;  Laterality: N/A;   IR ANGIOGRAM SELECTIVE EACH ADDITIONAL VESSEL  09/18/2021   IR ANGIOGRAM VISCERAL SELECTIVE  09/18/2021   IR EMBO ARTERIAL NOT HEMORR HEMANG INC GUIDE ROADMAPPING  09/18/2021   IR RADIOLOGIST EVAL & MGMT  09/09/2021   IR US  GUIDE VASC ACCESS RIGHT  09/18/2021   KNEE ARTHROSCOPY Right 09/02/2022   Procedure: Right knee arthroscopy; medial meniscal debridement;  Surgeon: Melodi Lerner, MD;  Location: [facility];  Service: Orthopedics;  Laterality: Right;   LIVER BIOPSY     RADIOLOGY WITH ANESTHESIA N/A 09/18/2021   Procedure: IR WITH ANESTHESIA SPLENIC ARTERY EMBOLIZATION;  Surgeon: Karalee Wilkie POUR, MD;  Location: [facility] OR;  Service: Radiology;  Laterality: N/A;   TONSILLECTOMY      Current Outpatient Medications:    guaiFENesin -codeine  100-10 MG/5ML syrup, Take 5 mLs by mouth every 6 (six) hours as needed for cough., Disp: 120 mL, Rfl: 0   JAKAFI  10 MG tablet, Take 10 mg by mouth 2 (two) times daily., Disp: , Rfl:    meloxicam  (MOBIC ) 7.5 MG tablet, TAKE 1 TABLET BY MOUTH EVERY DAY (Patient not taking: Reported on 08/29/2024), Disp: 30 tablet, Rfl: 0   ondansetron  (ZOFRAN -ODT) 4 MG disintegrating  tablet, Take 1 by mouth every 6-8 hours as needed for nausea, Disp: 30 tablet, Rfl: 2   pantoprazole  (PROTONIX ) 40 MG tablet, TAKE 1 TABLET BY MOUTH TWICE A DAY, Disp: 180 tablet, Rfl: 0       10/12/2023    1:00 PM  Montreal Cognitive Assessment   Visuospatial/ Executive (0/5) 1  Naming (0/3) 3  Attention: Read list of digits (0/2) 2  Attention: Read list of letters (0/1) 1  Attention: Serial 7 subtraction starting at 100 (0/3) 1  Language: Repeat phrase (0/2) 2  Language : Fluency (0/1) 1  Abstraction (0/2) 2  Delayed Recall (0/5) 4  Orientation (0/6) 6  Total 23  Adjusted Score (based on education) 23   Neuroimaging: A brain MRI had been ordered but not yet scheduled at the time of current writing. As such, no neuroimaging was available for review.   Clinical Interview:   The following information was obtained during a clinical interview with Courtney Buckley prior to cognitive testing.  Cognitive Symptoms: Decreased short-term memory: Endorsed. Her primary concerns surrounded trouble recalling details of recent conversations. She also noted that she very easily loses her train of thought, especially if she is interrupted while speaking.  Decreased long-term memory: Endorsed. She expressed some generalized concerns surrounding changes in long-term memory. However, no specific examples were provided.  Decreased attention/concentration: Endorsed. She reported prominent difficulties with distractibility and noted that she is often drifting off while in a group of individuals.  Reduced processing speed: Endorsed. Difficulties with executive functions: Denied. She also denied trouble with impulsivity or any prominent personality changes.  Difficulties with emotion regulation: Denied. Difficulties with receptive language: Denied. Difficulties with word finding: Denied. Decreased visuoperceptual ability: Denied.  Trajectory of deficits: All reported difficulties were said to be  noticeable for the past five or so years, coinciding with her myelofibrosis diagnosis and subsequent  treatment. Difficulties have more or less seemed stable over time.   Difficulties completing ADLs: Denied.  Additional Medical History: History of traumatic brain injury/concussion: Denied. History of stroke: Denied. History of seizure activity: Denied. History of known exposure to toxins: Denied. Symptoms of chronic pain: Denied. Experience of frequent headaches/migraines: Denied. She did report a remote history of migraine headaches.  Frequent instances of dizziness/vertigo: Denied.  Sensory changes: She utilizes glasses with benefit. She reported trouble hearing low grade sounds based on a prior hearing test. However, this was not to the extent that hearing aids were recommended. She reported a persistent loss of taste following a COVID-19 infection approximately two years prior. No difficulties with smell were reported.  Balance/coordination difficulties: She described her balance as a little dicey, noting greater instability while walking down steps. Some of this was attributed to having a weak knee in these situations. No recent falls were reported. She also described a tendency to lean to the right while walking. The latter was said to be observable for the past several years and she may bump into things in her environment as a result. The cause is unknown.  Other motor difficulties: Denied.  Other medical conditions: She has been diagnosed with myelofibrosis and currently takes oral medication (Jakafi  10 mg) for ongoing treatment.   Sleep History: Estimated hours obtained each night: Unclear. Difficulties falling asleep: Endorsed. Difficulties staying asleep: Endorsed. She reported rarely sleeping for more than 3-4 hours at a time, describing her sleep as terrible due to its broken nature. She has attempted to manage this over time. Most recently, she reported using THC gummies.  When taking two gummies at bed, she reported being able to maintain sleep for up to 6 hours without being awoken. However, this was said to lead to an increase in panic attacks, causing her to drop to one gummy before bed, maintaining 3-4 hours of consistent sleep at a time.  Feels rested and refreshed upon awakening: Variably so depending on the quantity and quality of sleep obtained the night before.   History of snoring: She reported her family describing clicking sounds while asleep rather than traditional snoring behaviors.  History of waking up gasping for air: Denied. Witnessed breath cessation while asleep: Denied.  History of vivid dreaming: Denied. Excessive movement while asleep: Denied outside of some tossing and turning behaviors.  Instances of acting out her dreams: Denied.  Psychiatric/Behavioral Health History: Depression: She described her current mood as okay at the present time. She denied to her knowledge any prior mental health concerns or formal diagnoses. She did describe acute stressors surrounding myelofibrosis treatment. She also noted that her ex-husband was going to be residing with her for a month or two following his upcoming bypass procedure, which did seem to create some anticipatory anxiety/stress. Current or remote suicidal ideation, intent, or plan was denied.  Anxiety: Denied. Mania: Denied. Trauma History: Denied. Visual/auditory hallucinations: Denied. Delusional thoughts: Denied.  Tobacco: Denied. Alcohol: She denied current alcohol consumption as well as a history of problematic alcohol abuse or dependence.  Recreational drugs: Denied.  Family History: Problem Relation Age of Onset   Memory loss Mother        38s   Cancer - Lung Father    Diabetes Mellitus II Maternal Aunt    Liver cancer Maternal Aunt    Stomach cancer Maternal Aunt    Diabetes Mellitus II Maternal Uncle    Colon cancer Neg Hx    Rectal cancer Neg Hx  Esophageal cancer  Neg Hx    Liver disease Neg Hx    Pancreatic cancer Neg Hx    Colon polyps Neg Hx    This information was confirmed by Courtney Buckley.  Academic/Vocational History: Highest level of educational attainment: 16 years. She graduated from high school and earned a [employer] degree in English. She described herself as a terrible student in high school, nearly failing out. She attributed this to her family being economically disadvantaged and alluded to her being the recipient of bullying due to weight gain. No specific academic weaknesses were reported.  History of developmental delay: Denied. History of grade repetition: Denied. Enrollment in special education courses: Denied. History of LD/ADHD: Denied.  Employment: Retired. She previously worked as a runner, broadcasting/film/video.   Evaluation Results:   Behavioral Observations: Ms. Kemnitz was unaccompanied, arrived to her appointment on time, and was appropriately dressed and groomed. She appeared alert. Observed gait and station were within normal limits. Gross motor functioning appeared intact upon informal observation and no abnormal movements (e.g., tremors) were noted. Her affect was generally relaxed and positive. Spontaneous speech was fluent and word finding difficulties were not observed during the clinical interview. Thought processes were coherent, organized, and normal in content. Insight into her cognitive difficulties appeared adequate.   During testing, sustained attention was appropriate. Task engagement was adequate and she persisted when challenged. Overall, Courtney Buckley was cooperative with the clinical interview and subsequent testing procedures.   Adequacy of Effort: The validity of neuropsychological testing is limited by the extent to which the individual being tested may be assumed to have exerted adequate effort during testing. Ms. Funches expressed her intention to perform to the best of her abilities and exhibited  adequate task engagement and persistence. Scores across stand-alone and embedded performance validity measures were within expectation. As such, the results of the current evaluation are believed to be a valid representation of Courtney Buckley's current cognitive functioning.  Test Results: Ms. Tat was fully oriented at the time of the current evaluation.  Intellectual abilities based upon educational and vocational attainment were estimated to be in the average range. Premorbid abilities were estimated to be within the well above average range based upon a single-word reading test.   Processing speed was average. Basic attention was average. More complex attention (e.g., working memory) was also average. Performance across a continuous performance task exhibited six atypical scores, suggesting primary difficulties with inattentiveness and sustained attention. Executive functioning was average to above average outside of an isolated impairment across a verbally mediated task assessing abstract reasoning.  While not directly assessed, receptive language abilities were believed to be intact. Ms. Urquidi did not exhibit any difficulties comprehending task instructions and answered all questions asked of her appropriately. Assessed expressive language was mildly variable but overall appropriate. Verbal fluency (both phonemic and semantic) was below average while confrontation naming was above average. .     Assessed visuospatial/visuoconstructional abilities were variable, ranging from the well below average to average normative ranges. Points were lost across her copy of a complex figure due to her having a sloppy and fairly careless approach, fully omitting two internal aspects and exhibiting other visual distortions concerning for diminished effort.    Learning (i.e., encoding) of novel verbal and visual information was exceptionally low to below average. Spontaneous delayed recall (i.e.,  retrieval) of previously learned information was variable, ranging from the well below average to average normative ranges. Retention rates were 125% across a list learning task, 87% across a  story learning task, 77% across a daily living task, and 83% across a shape learning task. Performance across recognition tasks was variable, ranging from the well below average to average normative ranges, suggesting some evidence for information consolidation.   Results of emotional screening instruments suggested that recent symptoms of generalized anxiety were in the mild range, while symptoms of depression were within normal limits. A screening instrument assessing recent sleep quality suggested the presence of moderate sleep dysfunction.  Table of Scores:   Note: This summary of test scores accompanies the interpretive report and should not be considered in isolation without reference to the appropriate sections in the text. Descriptors are based on appropriate normative data and may be adjusted based on clinical judgment. Terms such as Within Normal Limits and Outside Normal Limits are used when a more specific description of the test score cannot be determined.       Percentile - Normative Descriptor > 98 - Exceptionally High 91-97 - Well Above Average 75-90 - Above Average 25-74 - Average 9-24 - Below Average 2-8 - Well Below Average < 2 - Exceptionally Low       Validity:   DESCRIPTOR       DCT: --- --- Within Normal Limits  NAB EVI: --- --- Within Normal Limits       Orientation:      Raw Score Percentile   NAB Orientation, Form 1 29/29 --- ---       Cognitive Screening:      Raw Score Percentile   SLUMS: 25/30 --- ---       Intellectual Functioning:      Standard Score Percentile   Test of Premorbid Functioning: 121 92 Well Above Average       Memory:     NAB Memory Module, Form 1: Standard Score/ T Score Percentile   Total Memory Index 70 2 Well Below Average  List Learning        Total Trials 1-3 13/36 (27) 1 Exceptionally Low    List B 3/12 (39) 14 Below Average    Short Delay Free Recall 4/12 (30) 2 Well Below Average    Long Delay Free Recall 5/12 (37) 9 Below Average    Retention Percentage 125 (59) 82 Above Average    Recognition Discriminability 6 (48) 42 Average  Shape Learning       Total Trials 1-3 12/27 (38) 12 Below Average    Delayed Recall 5/9 (45) 31 Average    Retention Percentage 83 (45) 31 Average    Recognition Discriminability 5 (42) 21 Below Average  Story Learning       Immediate Recall 50/80 (35) 7 Well Below Average    Delayed Recall 26/40 (36) 8 Well Below Average    Retention Percentage 87 (47) 38 Average  Daily Living Memory       Immediate Recall 33/51 (32) 4 Well Below Average    Delayed Recall 10/17 (32) 4 Well Below Average    Retention Percentage 77 (45) 31 Average    Recognition Hits 7/10 (36) 8 Well Below Average       Attention/Executive Function:     Trail Making Test (TMT): Raw Score (T Score) Percentile     Part A 34 secs.,  0 errors (47) 38 Average    Part B 75 secs.,  1 error (49) 46 Average         Scaled Score Percentile   WAIS-5 Coding: 9 37 Average  WAIS-5 Naming Speed Quantity: 11  63 Average       NAB Attention Module, Form 1: T Score Percentile     Digits Forward 45 31 Average    Digits Backwards 45 31 Average       Conners CPT 3: T Score Percentile     d' 56 73 High Average    Omissions 63 91 Elevated    Commissions 42 21 Low    Perseverations 65 93 Elevated    HRT 70 98 Atypically Slow    HRT SD 64 92 Elevated    Variability 69 97 Elevated        Scaled Score Percentile   WAIS-5 Similarities: 4 2 Well Below Average  WAIS-5 Matrix Reasoning: 10 50 Average  WAIS-5 Figure Weights: 13 84 Above Average       D-KEFS Verbal Fluency Test: Raw Score (Scaled Score) Percentile     Letter Total Correct 33 (9) 37 Average    Category Total Correct 32 (10) 50 Average    Category Switching Total  Correct 13 (11) 63 Average    Category Switching Accuracy 12 (11) 63 Average      Total Set Loss Errors 1 (11) 63 Average      Total Repetition Errors 0 (13) 84 Above Average       Language:     Verbal Fluency Test: Raw Score (T Score) Percentile     Phonemic Fluency (FAS) 33 (40) 16 Below Average    Animal Fluency 15 (41) 18 Below Average        NAB Language Module, Form 1: T Score Percentile     Naming 30/31 (57) 75 Above Average       Visuospatial/Visuoconstruction:      Raw Score Percentile   Clock Drawing: 9/10 --- Within Normal Limits       NAB Spatial Module, Form 1: T Score Percentile     Figure Drawing Copy 24 <1 Exceptionally Low        Scaled Score Percentile   WAIS-5 Block Design: 5 5 Well Below Average       Mood and Personality:      Raw Score Percentile   Beck Depression Inventory - II: 11 --- Within Normal Limits  PROMIS Anxiety Questionnaire: 17 --- Mild       Additional Questionnaires:      Raw Score Percentile   PROMIS Sleep Disturbance Questionnaire: 31 --- Moderate   Informed Consent and Coding/Compliance:   The current evaluation represents a clinical evaluation for the purposes previously outlined by the referral source and is in no way reflective of a forensic evaluation.   Ms. Boyar was provided with a verbal description of the nature and purpose of the present neuropsychological evaluation. Also reviewed were the foreseeable risks and/or discomforts and benefits of the procedure, limits of confidentiality, and mandatory reporting requirements of this provider. The patient was given the opportunity to ask questions and receive answers about the evaluation. Oral consent to participate was provided by the patient.   This evaluation was conducted by Arthea KYM Maryland, Ph.D., ABPP-CN, board certified clinical neuropsychologist. Ms. Gayleen completed a clinical interview with Dr. Maryland, billed as one unit [phone], and 155 minutes of cognitive testing  and scoring, billed as one unit [phone] and four additional units [id number]. Psychometrist Lonell Jude, B.S. assisted Dr. Maryland with test administration and scoring procedures. As a separate and discrete service, one unit [phone] and two units 96133 (160 minutes) were billed for Dr. Loralee time spent in interpretation and  report writing.

## 2024-09-12 NOTE — Progress Notes (Signed)
   Psychometrician Note   Cognitive testing was administered to Courtney Buckley by Lonell Jude, B.S. (psychometrist) under the supervision of Dr. Arthea KYM Maryland, Ph.D., ABPP, licensed psychologist on 09/12/2024. Courtney Buckley did not appear overtly distressed by the testing session per behavioral observation or responses across self-report questionnaires. Rest breaks were offered.   The battery of tests administered was selected by Dr. Zachary C. Merz, Ph.D., ABPP with consideration to Courtney Buckley's current level of functioning, the nature of her symptoms, emotional and behavioral responses during interview, level of literacy, observed level of motivation/effort, and the nature of the referral question. This battery was communicated to the psychometrist. Communication between Dr. Arthea KYM Maryland, Ph.D., ABPP and the psychometrist was ongoing throughout the evaluation and Dr. Arthea KYM Maryland, Ph.D., ABPP was immediately accessible at all times. Dr. Zachary C. Merz, Ph.D., ABPP provided supervision to the psychometrist on the date of this service to the extent necessary to assure the quality of all services provided.    Courtney Buckley will return within approximately 1-2 weeks for an interactive feedback session with Dr. Maryland at which time her test performances, clinical impressions, and treatment recommendations will be reviewed in detail. Courtney Buckley understands she can contact our office should she require our assistance before this time.  A total of 155 minutes of billable time were spent face-to-face with Courtney Buckley by the psychometrist. This includes both test administration and scoring time. Billing for these services is reflected in the clinical report generated by Dr. Arthea KYM Maryland, Ph.D., ABPP  This note reflects time spent with the psychometrician and does not include test scores or any clinical interpretations made by Dr. Maryland. The full report will follow in a  separate note.

## 2024-09-14 ENCOUNTER — Telehealth: Payer: Self-pay | Admitting: Internal Medicine

## 2024-09-14 DIAGNOSIS — K766 Portal hypertension: Secondary | ICD-10-CM

## 2024-09-14 DIAGNOSIS — K7460 Unspecified cirrhosis of liver: Secondary | ICD-10-CM

## 2024-09-14 NOTE — Telephone Encounter (Signed)
 My chart message from patient as follows:  I have appt. With Dr. Lovenia in January. My cancer doctor wanted to see if I could get a full belly scan to check both the liver and the spleen. She is worrying about cancer developing in my liver. Thank you for all you do for me. Courtney Buckley

## 2024-09-18 NOTE — Telephone Encounter (Signed)
 Ok for [facility] screening now, as this is overdue (though previously recommended by [facility]) CT abd/pelvis with contrast -- [facility] protocol JMP

## 2024-09-19 ENCOUNTER — Ambulatory Visit: Payer: Medicare PPO | Admitting: Psychology

## 2024-09-19 DIAGNOSIS — R41842 Visuospatial deficit: Secondary | ICD-10-CM | POA: Diagnosis not present

## 2024-09-19 DIAGNOSIS — F0670 Mild neurocognitive disorder due to known physiological condition without behavioral disturbance: Secondary | ICD-10-CM | POA: Diagnosis not present

## 2024-09-19 NOTE — Progress Notes (Signed)
   Neuropsychology Feedback Session [name]. [facility]  Department of Neurology  Reason for Referral:   Courtney Buckley is a 73 y.o. right-handed Caucasian female referred by Camie Sevin, PA-C, to characterize her current cognitive functioning and assist with diagnostic clarity and treatment planning in the context of subjective cognitive decline.   Feedback:   Courtney Buckley completed a comprehensive neuropsychological evaluation on 09/12/2024. Please refer to that encounter for the full report and recommendations. Briefly, results suggested primary dysfunction surrounding sustained attention, visuospatial abilities (outside of her drawing of a clock which was appropriate), and encoding (i.e., learning) aspects of memory. Further performance variability was exhibited across both delayed retrieval and recognition/consolidation aspects of memory. The etiology for ongoing dysfunction is unclear at the present time. During interview, Courtney Buckley described her most salient concern surrounding attention/concentration and distractibility. Across a continuous performance task, her performances suggested significant difficulties with inattentiveness and sustained attention, supporting her observations. This task suggested a high likelihood of having a disorder characterized by attention deficits. 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 She did not report prominent symptoms in early childhood or adolescence during the current interview. Given the lack of documented symptoms at a young age, she would not meet diagnostic criteria for ADHD. Current difficulties may mirror difficulties experienced by individuals with this diagnosis. However, they may be more likely related to her medical and pharmacological history rather than the  presence of a neurodevelopmental disorder. Regular THC use and prominent sleep dysfunction will also worsen attentional abilities, as will day-to-day psychosocial stressors and generalized anxious distress. There is not compelling data to warrant advanced concern for a neurodegenerative illness at the present time.  Courtney Buckley was accompanied by her husband during the current feedback session. Content of the current session focused on the results of her neuropsychological evaluation. Courtney Buckley was given the opportunity to ask questions and her questions were answered. She was encouraged to reach out should additional questions arise. A copy of her report was provided at the conclusion of the visit.      One unit 96132 (33 minutes) was billed for Dr. Loralee time spent preparing for, conducting, and documenting the current feedback session with Courtney Buckley.

## 2024-09-19 NOTE — Telephone Encounter (Signed)
 Patient has been scheduled for CT abdomen/pelvis at [facility] on 09/25/24 at 3:30 pm with 3:15 pm arrival.  I have left a voicemail for patient to call back. I will also send her a mychart message.

## 2024-09-20 NOTE — Telephone Encounter (Signed)
 Left message for patient to call back

## 2024-09-25 ENCOUNTER — Ambulatory Visit (HOSPITAL_COMMUNITY)
Admission: RE | Admit: 2024-09-25 | Discharge: 2024-09-25 | Disposition: A | Discharge status: Home / Self Care | Source: Ambulatory Visit | Attending: Internal Medicine | Admitting: Internal Medicine

## 2024-09-25 DIAGNOSIS — K8020 Calculus of gallbladder without cholecystitis without obstruction: Secondary | ICD-10-CM | POA: Diagnosis not present

## 2024-09-25 DIAGNOSIS — D259 Leiomyoma of uterus, unspecified: Secondary | ICD-10-CM | POA: Diagnosis not present

## 2024-09-25 DIAGNOSIS — K766 Portal hypertension: Secondary | ICD-10-CM | POA: Insufficient documentation

## 2024-09-25 DIAGNOSIS — K7460 Unspecified cirrhosis of liver: Secondary | ICD-10-CM | POA: Diagnosis not present

## 2024-09-25 DIAGNOSIS — D1803 Hemangioma of intra-abdominal structures: Secondary | ICD-10-CM | POA: Diagnosis not present

## 2024-09-25 DIAGNOSIS — R161 Splenomegaly, not elsewhere classified: Secondary | ICD-10-CM | POA: Diagnosis not present

## 2024-09-25 MED ORDER — IOHEXOL 300 MG/ML  SOLN
100.0000 mL | Freq: Once | INTRAMUSCULAR | Status: AC | PRN
  Administered 2024-09-25: 100 mL via INTRAVENOUS

## 2024-09-25 MED ORDER — SODIUM CHLORIDE (PF) 0.9 % IJ SOLN
INTRAMUSCULAR | Status: AC
Start: 2024-09-25 — End: 2024-09-25
  Filled 2024-09-25: qty 50

## 2024-10-02 ENCOUNTER — Other Ambulatory Visit: Payer: Self-pay | Admitting: Internal Medicine

## 2024-10-02 ENCOUNTER — Ambulatory Visit: Payer: Self-pay | Admitting: Internal Medicine

## 2024-10-31 ENCOUNTER — Encounter: Payer: Self-pay | Admitting: Physician Assistant

## 2024-11-01 NOTE — Telephone Encounter (Signed)
 Please see patient msg and advise. Pt is showing to have completed 7 Covid vaccines/boosters in total

## 2024-11-29 ENCOUNTER — Ambulatory Visit: Admitting: Internal Medicine

## 2024-11-30 ENCOUNTER — Telehealth: Payer: Self-pay | Admitting: Internal Medicine

## 2024-11-30 MED ORDER — ONDANSETRON 4 MG PO TBDP: 4.0000 mg | ORAL_TABLET | Freq: Three times a day (TID) | ORAL | 0 refills | Status: AC | PRN

## 2024-11-30 MED ORDER — PANTOPRAZOLE SODIUM 40 MG PO TBEC: 40.0000 mg | DELAYED_RELEASE_TABLET | Freq: Two times a day (BID) | ORAL | 0 refills | Status: AC

## 2024-11-30 NOTE — Telephone Encounter (Signed)
 Prescriptions sent to patient's pharmacy.

## 2024-11-30 NOTE — Telephone Encounter (Signed)
 Inbound call from patient requesting a refill for ondansetron  and pantoprazole . Patient is rescheduled for 2/24. Please advise, thank you

## 2024-12-06 ENCOUNTER — Ambulatory Visit: Payer: Self-pay

## 2024-12-06 NOTE — Telephone Encounter (Signed)
 FYI Only or Action Required?: FYI only for provider: appointment scheduled on 12/19/24.  Patient was last seen in primary care on 08/29/2024 by Allwardt, Mardy HERO, PA-C.  Called Nurse Triage reporting Anxiety.  Symptoms began a week ago.  Interventions attempted: Nothing.  Symptoms are: unchanged.  Triage Disposition: See PCP Within 2 Weeks (overriding See PCP When Office is Open (Within 3 Days))  Patient/caregiver understands and will follow disposition?: Yes   Reason for Disposition  MODERATE anxiety (e.g., persistent or frequent anxiety symptoms; interferes with sleep, school, or work)  Answer Assessment - Initial Assessment Questions Patient states that she has been experiencing trouble sleeping at night due to her mind running and then wakes up in the morning feeling very nauseous. She would like to speak with her PCP about anxiety medication. Office visit advised, would only like to see her PCP. Appt scheduled for soonest available with PCP.   1. CONCERN: Did anything happen that prompted you to call today?      Having difficulty sleeping and nausea  2. ANXIETY SYMPTOMS: Can you describe how you (your loved one; patient) have been feeling? (e.g., tense, restless, panicky, anxious, keyed up, overwhelmed, sense of impending doom).      Anxious, mind running  3. ONSET: How long have you been feeling this way? (e.g., hours, days, weeks)     Unknown  4. SEVERITY: How would you rate the level of anxiety? (e.g., 0 - 10; or mild, moderate, severe).     Moderate  5. FUNCTIONAL IMPAIRMENT: How have these feelings affected your ability to do daily activities? Have you had more difficulty than usual doing your normal daily activities? (e.g., getting better, same, worse; self-care, school, work, interactions)     Some  6. HISTORY: Have you felt this way before? Have you ever been diagnosed with an anxiety problem in the past? (e.g., generalized anxiety disorder, panic  attacks, PTSD). If Yes, ask: How was this problem treated? (e.g., medicines, counseling, etc.)     No  7. RISK OF HARM - SUICIDAL IDEATION: Do you ever have thoughts of hurting or killing yourself? If Yes, ask:  Do you have these feelings now? Do you have a plan on how you would do this?     Denies any thoughts of self-harm or harming others  8. TREATMENT:  What has been done so far to treat this anxiety? (e.g., medicines, relaxation strategies). What has helped?     No treatments  9. THERAPIST: Do you have a counselor or therapist? If Yes, ask: What is their name?     Unknown  10. POTENTIAL TRIGGERS: Do you drink caffeinated beverages (e.g., coffee, colas, teas), and how much daily? Do you drink alcohol or use any drugs? Have you started any new medicines recently?       No  11. PATIENT SUPPORT: Who is with you now? Who do you live with? Do you have family or friends who you can talk to?        Talks with daughter  35. OTHER SYMPTOMS: Do you have any other symptoms? (e.g., feeling depressed, trouble concentrating, trouble sleeping, trouble breathing, palpitations or fast heartbeat, chest pain, sweating, nausea, or diarrhea)       Trouble sleeping, nausea  13. PREGNANCY: Is there any chance you are pregnant? When was your last menstrual period?       NA  Protocols used: Anxiety and Panic Attack-A-AH  Reason for Triage: Pt stated that she is experiencing anxiety, nausea, and  is having trouble sleeping. Pt wants to get an appt scheduled with her provider if possible.

## 2024-12-06 NOTE — Telephone Encounter (Signed)
 Noted.

## 2024-12-19 ENCOUNTER — Ambulatory Visit: Admitting: Physician Assistant

## 2024-12-26 ENCOUNTER — Ambulatory Visit: Admitting: Gastroenterology

## 2025-06-27 ENCOUNTER — Ambulatory Visit
